# Patient Record
Sex: Female | Born: 2000 | Race: Black or African American | Hispanic: No | Marital: Single | Smoking: Never smoker
Health system: Southern US, Community
[De-identification: ages and names within clinical notes are randomized; demographics above are authoritative.]

## PROBLEM LIST (undated history)

## (undated) VITALS — BP 106/59 | HR 79 | Temp 98.6°F | Resp 16 | Ht <= 58 in | Wt 118.0 lb

## (undated) DIAGNOSIS — F329 Major depressive disorder, single episode, unspecified: Secondary | ICD-10-CM

## (undated) DIAGNOSIS — F32A Depression, unspecified: Secondary | ICD-10-CM

## (undated) DIAGNOSIS — F419 Anxiety disorder, unspecified: Secondary | ICD-10-CM

## (undated) DIAGNOSIS — F319 Bipolar disorder, unspecified: Secondary | ICD-10-CM

## (undated) HISTORY — PX: NO PAST SURGERIES: SHX2092

---
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
## 2019-02-07 ENCOUNTER — Telehealth: Payer: Medicaid Other | Admitting: Family

## 2019-02-07 DIAGNOSIS — R079 Chest pain, unspecified: Secondary | ICD-10-CM

## 2019-02-07 DIAGNOSIS — R0602 Shortness of breath: Secondary | ICD-10-CM

## 2019-02-07 DIAGNOSIS — K219 Gastro-esophageal reflux disease without esophagitis: Secondary | ICD-10-CM

## 2019-02-07 DIAGNOSIS — R062 Wheezing: Secondary | ICD-10-CM

## 2019-02-07 NOTE — Progress Notes (Signed)
Based on what you shared with me, I feel your condition warrants further evaluation and I recommend that you be seen for a face to face office visit.    Given your symptoms of acid reflux, chest pain, shortness of breath, and wheezing you need to be seen face-to-face to be evaluated. These are not normal symptoms of acid reflux.  NOTE: If you entered your credit card information for this eVisit, you will not be charged. You may see a "hold" on your card for the $35 but that hold will drop off and you will not have a charge processed.  If you are having a true medical emergency please call 911.  If you need an urgent face to face visit, [place] has four urgent care centers for your convenience.    PLEASE NOTE: THE [facility] LOCATIONS AND URGENT CARE CLINICS DO NOT HAVE THE TESTING FOR CORONAVIRUS COVID19 AVAILABLE.  IF YOU FEEL YOU NEED THIS TEST YOU MUST GO TO A TRIAGE LOCATION AT ONE OF THE HOSPITAL EMERGENCY DEPARTMENTS   [email or web address] to reserve your spot online an avoid wait times  [facility] [address], [address] [place], [place] [address] Modified hours of operation: Monday-Friday, 12 PM to 6 PM  Saturday & Sunday 10 AM to 4 PM *Across the street from [employer]  [employer] (New Address!) [address], [address] [place], [place] [address] *Just off [employer], across the road from [place]* Modified hours of operation: Monday-Friday, 12 PM to 6 PM  Closed Saturday & Sunday  [facility]'s modified hours of operation will be in effect from May 1 until May 31   The following sites will take your insurance:  . [facility]  [phone] Get Driving Directions Find a Provider at this Location  [address] [place], [place] [address] . 10 am to 8 pm Monday-Friday . 12 pm to 8 pm Saturday-Sunday   . [facility] at [facility]  [phone] Get Driving Directions Find  a Provider at this Location  [address] [place], [place] [address] . 8 am to 8 pm Monday-Friday . 9 am to 6 pm Saturday . 11 am to 6 pm Sunday   . [facility] at [facility]  [phone] Get Driving Directions  [address] [place], [place] [address] . 8 am to 8 pm Monday-Friday . 8 am to 4 pm Saturday-Sunday   Your e-visit answers were reviewed by a board certified advanced clinical practitioner to complete your personal care plan.  Thank you for using e-Visits.

## 2019-02-12 ENCOUNTER — Emergency Department (HOSPITAL_COMMUNITY): Payer: Medicaid Other

## 2019-02-12 ENCOUNTER — Encounter (HOSPITAL_COMMUNITY): Payer: Self-pay | Admitting: Emergency Medicine

## 2019-02-12 ENCOUNTER — Emergency Department (HOSPITAL_COMMUNITY)
Admission: EM | Admit: 2019-02-12 | Discharge: 2019-02-12 | Disposition: A | Discharge status: Home / Self Care | Payer: Medicaid Other | Attending: Emergency Medicine | Admitting: Emergency Medicine

## 2019-02-12 ENCOUNTER — Other Ambulatory Visit: Payer: Self-pay

## 2019-02-12 DIAGNOSIS — R1012 Left upper quadrant pain: Secondary | ICD-10-CM | POA: Diagnosis not present

## 2019-02-12 DIAGNOSIS — R1013 Epigastric pain: Secondary | ICD-10-CM | POA: Insufficient documentation

## 2019-02-12 DIAGNOSIS — R109 Unspecified abdominal pain: Secondary | ICD-10-CM | POA: Diagnosis present

## 2019-02-12 LAB — COMPREHENSIVE METABOLIC PANEL
ALT: 13 U/L (ref 0–44)
AST: 18 U/L (ref 15–41)
Albumin: 4 g/dL (ref 3.5–5.0)
Alkaline Phosphatase: 47 U/L (ref 38–126)
Anion gap: 12 (ref 5–15)
BUN: 12 mg/dL (ref 6–20)
CO2: 22 mmol/L (ref 22–32)
Calcium: 9.1 mg/dL (ref 8.9–10.3)
Chloride: 105 mmol/L (ref 98–111)
Creatinine, Ser: 0.74 mg/dL (ref 0.44–1.00)
GFR calc Af Amer: >60 mL/min (ref >60)
GFR calc non Af Amer: >60 mL/min (ref >60)
Glucose, Bld: 105 mg/dL — ABNORMAL HIGH (ref 70–99)
Potassium: 3.6 mmol/L (ref 3.5–5.1)
Sodium: 139 mmol/L (ref 135–145)
Total Bilirubin: 0.3 mg/dL (ref 0.3–1.2)
Total Protein: 6.6 g/dL (ref 6.5–8.1)

## 2019-02-12 LAB — CBC
HCT: 37.1 % (ref 36.0–46.0)
Hemoglobin: 12.5 g/dL (ref 12.0–15.0)
MCH: 28.9 pg (ref 26.0–34.0)
MCHC: 33.7 g/dL (ref 30.0–36.0)
MCV: 85.9 fL (ref 80.0–100.0)
Platelets: 309 10*3/uL (ref 150–400)
RBC: 4.32 MIL/uL (ref 3.87–5.11)
RDW: 12.4 % (ref 11.5–15.5)
WBC: 6.9 10*3/uL (ref 4.0–10.5)
nRBC: 0 % (ref 0.0–0.2)

## 2019-02-12 LAB — URINALYSIS, ROUTINE W REFLEX MICROSCOPIC
Bilirubin Urine: NEGATIVE
Glucose, UA: NEGATIVE mg/dL
Ketones, ur: NEGATIVE mg/dL
Leukocytes,Ua: NEGATIVE
Nitrite: NEGATIVE
Protein, ur: NEGATIVE mg/dL
Specific Gravity, Urine: 1.025 (ref 1.005–1.030)
pH: 6 (ref 5.0–8.0)

## 2019-02-12 LAB — URINALYSIS, MICROSCOPIC (REFLEX)

## 2019-02-12 LAB — I-STAT BETA HCG BLOOD, ED (MC, WL, AP ONLY): I-stat hCG, quantitative: <5 m[IU]/mL (ref <5)

## 2019-02-12 LAB — LIPASE, BLOOD: Lipase: 35 U/L (ref 11–51)

## 2019-02-12 MED ORDER — OMEPRAZOLE 20 MG PO CPDR: 20.0000 mg | DELAYED_RELEASE_CAPSULE | Freq: Every day | ORAL | 1 refills | Status: DC

## 2019-02-12 MED ORDER — ALUM & MAG HYDROXIDE-SIMETH 200-200-20 MG/5ML PO SUSP
30.0000 mL | Freq: Once | ORAL | Status: AC
  Administered 2019-02-12: 30 mL via ORAL
  Filled 2019-02-12: qty 30

## 2019-02-12 MED ORDER — SODIUM CHLORIDE 0.9% FLUSH
3.0000 mL | Freq: Once | INTRAVENOUS | Status: AC
  Administered 2019-02-12: 18:00:00 3 mL via INTRAVENOUS

## 2019-02-12 NOTE — Discharge Instructions (Signed)
Return if any problems.  See your Physician for recheck in 2-3 days °

## 2019-02-12 NOTE — ED Notes (Signed)
Patient verbalizes understanding of discharge instructions. Opportunity for questioning and answers were provided. Armband removed by staff, pt discharged from ED.  

## 2019-02-12 NOTE — ED Triage Notes (Signed)
Patient reports L sided abdominal pain x 1 week. Denies N/V/D, fevers/chills, or urinary symptoms. LMP last week.
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
## 2019-03-27 ENCOUNTER — Telehealth: Payer: Self-pay | Admitting: Licensed Clinical Social Worker

## 2019-03-27 NOTE — Telephone Encounter (Signed)
Spk with pt regarding scheduled virtual visit. Pt expressed understanding

## 2019-03-28 ENCOUNTER — Encounter: Payer: Self-pay | Admitting: Certified Nurse Midwife

## 2019-03-28 ENCOUNTER — Ambulatory Visit (INDEPENDENT_AMBULATORY_CARE_PROVIDER_SITE_OTHER): Payer: Medicaid Other | Admitting: Certified Nurse Midwife

## 2019-03-28 DIAGNOSIS — N946 Dysmenorrhea, unspecified: Secondary | ICD-10-CM | POA: Diagnosis not present

## 2019-03-28 NOTE — Progress Notes (Signed)
LMP:03/27/19 Periods are irregular  Pt is sexually active . Contraception: Pills  CC: Periods have become very painful last cycle had pain *chest and *back pain pt notes vomiting. Pt states  OTC pain medication does not help. Pt states this has been a issue for a long time.

## 2019-03-28 NOTE — Progress Notes (Signed)
    TELEHEALTH GYNECOLOGY VIRTUAL VIDEO VISIT ENCOUNTER NOTE  Provider location: [facility] for [facility] at [place]   I connected with Suzanne Garcia on 03/28/19 at  9:47 AM EDT by WebEx Video Encounter at home and verified that I am speaking with the correct person using two identifiers.   I discussed the limitations, risks, security and privacy concerns of performing an evaluation and management service by telephone and the availability of in person appointments. I also discussed with the patient that there may be a patient responsible charge related to this service. The patient expressed understanding and agreed to proceed.   History:  Suzanne Garcia is a 18 y.o. G0P0000 female being evaluated today for dysmenorrhea and vomiting. She denies any abnormal vaginal discharge, bleeding, pain with intercourse or other concerns.       Past Medical History:  Diagnosis Date  . Depression    History reviewed. No pertinent surgical history. The following portions of the patient's history were reviewed and updated as appropriate: allergies, current medications, past family history, past medical history, past social history, past surgical history and problem list.   Health Maintenance:  She has never had a pap, <20yo   Review of Systems:  Pertinent items noted in HPI and remainder of comprehensive ROS otherwise negative.  Physical Exam:   General:  Alert, oriented and cooperative. Patient appears to be in no acute distress.  Mental Status: Normal mood and affect. Normal behavior. Normal judgment and thought content.   Respiratory: Normal respiratory effort, no problems with respiration noted  Rest of physical exam deferred due to type of encounter  Labs and Imaging No results found for this or any previous visit (from the past 336 hour(s)). No results found.     Assessment and Plan:     1. Dysmenorrhea in adolescent - Patient reports dysmenorrhea has been occurring for the  past 2-3 years  - Pain is bad where is causes her to vomit, rates pain 8/10 when it occurs - Has tried taking Tylenol and Ibuprofen in the past and it does not work  - [facility] started her on [place] on 6/9  - Reports starting cycle yesterday, 6/23, pain is better with this cycle since starting medication  - Educated on giving OCPs 2-3 months to have full effect  - will follow up in office in 2-3 months for dysmenorrhea, if pain is worse will schedule patient for a pelvic US, patient aware of plan and agrees.      I discussed the assessment and treatment plan with the patient. The patient was provided an opportunity to ask questions and all were answered. The patient agreed with the plan and demonstrated an understanding of the instructions.   The patient was advised to call back or seek an in-person evaluation/go to the ED if the symptoms worsen or if the condition fails to improve as anticipated.  I provided 14 minutes of face-to-face time during this encounter.   Lajean Manes, [place] for [employer], [place]

## 2019-05-23 ENCOUNTER — Ambulatory Visit (HOSPITAL_COMMUNITY)
Admission: EM | Admit: 2019-05-23 | Discharge: 2019-05-23 | Disposition: A | Discharge status: Home / Self Care | Payer: Medicaid Other | Attending: Family Medicine | Admitting: Family Medicine

## 2019-05-23 ENCOUNTER — Other Ambulatory Visit: Payer: Self-pay

## 2019-05-23 ENCOUNTER — Encounter (HOSPITAL_COMMUNITY): Payer: Self-pay | Admitting: Emergency Medicine

## 2019-05-23 DIAGNOSIS — N766 Ulceration of vulva: Secondary | ICD-10-CM | POA: Diagnosis present

## 2019-05-23 DIAGNOSIS — Z113 Encounter for screening for infections with a predominantly sexual mode of transmission: Secondary | ICD-10-CM | POA: Insufficient documentation

## 2019-05-23 MED ORDER — VALACYCLOVIR HCL 1 G PO TABS: 1000.0000 mg | ORAL_TABLET | Freq: Two times a day (BID) | ORAL | 0 refills | Status: AC
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
## 2019-05-23 NOTE — Discharge Instructions (Signed)
We will start treatment for herpes pending the results of the test. I think this may be more related to shaving and ingrown hair which erupted.  Avoid friction to the area, such as with intercourse, until it has healed.  Will notify you of any positive findings and if any changes to treatment are needed.  Dennis Bast may monitor your results on your MyChart online as well.   Medication may be stopped if your test is negative.  If any worsening or persistent symptoms please return or follow up with your pcp.

## 2019-05-23 NOTE — ED Triage Notes (Signed)
Pt here for vaginal discharge and possible "bump" to vaginal area

## 2019-05-25 LAB — CERVICOVAGINAL ANCILLARY ONLY
Bacterial vaginitis: NEGATIVE
Candida vaginitis: NEGATIVE
Chlamydia: NEGATIVE
Neisseria Gonorrhea: NEGATIVE
Trichomonas: NEGATIVE

## 2019-05-27 ENCOUNTER — Ambulatory Visit (HOSPITAL_COMMUNITY)
Admission: EM | Admit: 2019-05-27 | Discharge: 2019-05-27 | Disposition: A | Discharge status: Home / Self Care | Payer: Medicaid Other | Attending: Family Medicine | Admitting: Family Medicine

## 2019-05-27 ENCOUNTER — Encounter (HOSPITAL_COMMUNITY): Payer: Self-pay

## 2019-05-27 ENCOUNTER — Other Ambulatory Visit: Payer: Self-pay

## 2019-05-27 DIAGNOSIS — Z202 Contact with and (suspected) exposure to infections with a predominantly sexual mode of transmission: Secondary | ICD-10-CM

## 2019-05-27 LAB — HSV CULTURE AND TYPING

## 2019-05-27 NOTE — Discharge Instructions (Signed)
As discussed, no new lesions for swabbing. You can follow up with a PCP for further evaluation needed.

## 2019-05-27 NOTE — ED Provider Notes (Signed)
Patient requesting retesting of HSV. Was seen 05/23/2019 and swabbed ulceration for HSV with positive results but low viral count. No new lesions. No new symptoms. No other complaints.   Discussed with patient, no available retest available as we do not provide blood antibody testing. No new lesions for swabbing. Provided PCP information for patient to follow if needed.    Ok Edwards, PA-C 05/27/19 1327

## 2019-05-27 NOTE — ED Triage Notes (Signed)
Pt states she wants to be tested for Herpes.

## 2019-07-09 ENCOUNTER — Other Ambulatory Visit: Payer: Self-pay

## 2019-07-09 ENCOUNTER — Encounter (HOSPITAL_COMMUNITY): Payer: Self-pay | Admitting: Emergency Medicine

## 2019-07-09 ENCOUNTER — Emergency Department (HOSPITAL_COMMUNITY): Payer: Medicaid Other

## 2019-07-09 ENCOUNTER — Emergency Department (HOSPITAL_COMMUNITY)
Admission: EM | Admit: 2019-07-09 | Discharge: 2019-07-10 | Disposition: A | Discharge status: Home / Self Care | Payer: Medicaid Other | Attending: Emergency Medicine | Admitting: Emergency Medicine

## 2019-07-09 DIAGNOSIS — Z79899 Other long term (current) drug therapy: Secondary | ICD-10-CM | POA: Diagnosis not present

## 2019-07-09 DIAGNOSIS — Z3202 Encounter for pregnancy test, result negative: Secondary | ICD-10-CM

## 2019-07-09 DIAGNOSIS — R0789 Other chest pain: Secondary | ICD-10-CM

## 2019-07-09 DIAGNOSIS — R0602 Shortness of breath: Secondary | ICD-10-CM | POA: Diagnosis not present

## 2019-07-09 LAB — BASIC METABOLIC PANEL
Anion gap: 8 (ref 5–15)
BUN: 9 mg/dL (ref 6–20)
CO2: 24 mmol/L (ref 22–32)
Calcium: 8.9 mg/dL (ref 8.9–10.3)
Chloride: 107 mmol/L (ref 98–111)
Creatinine, Ser: 0.81 mg/dL (ref 0.44–1.00)
GFR calc Af Amer: >60 mL/min (ref >60)
GFR calc non Af Amer: >60 mL/min (ref >60)
Glucose, Bld: 102 mg/dL — ABNORMAL HIGH (ref 70–99)
Potassium: 3.5 mmol/L (ref 3.5–5.1)
Sodium: 139 mmol/L (ref 135–145)

## 2019-07-09 LAB — CBC
HCT: 37.4 % (ref 36.0–46.0)
Hemoglobin: 12.7 g/dL (ref 12.0–15.0)
MCH: 29.5 pg (ref 26.0–34.0)
MCHC: 34 g/dL (ref 30.0–36.0)
MCV: 87 fL (ref 80.0–100.0)
Platelets: 351 10*3/uL (ref 150–400)
RBC: 4.3 MIL/uL (ref 3.87–5.11)
RDW: 13.1 % (ref 11.5–15.5)
WBC: 7.2 10*3/uL (ref 4.0–10.5)
nRBC: 0 % (ref 0.0–0.2)

## 2019-07-09 LAB — I-STAT BETA HCG BLOOD, ED (MC, WL, AP ONLY): I-stat hCG, quantitative: <5 m[IU]/mL (ref <5)

## 2019-07-09 LAB — TROPONIN I (HIGH SENSITIVITY): Troponin I (High Sensitivity): <2 ng/L (ref <18)

## 2019-07-09 MED ORDER — SODIUM CHLORIDE 0.9% FLUSH: 3.0000 mL | Freq: Once | INTRAVENOUS | Status: DC

## 2019-07-09 NOTE — ED Triage Notes (Signed)
Pt c/o chest pain x 1 week, also shortness of breath when exercising. Denies cough/fever/known COVID exposure. Also requesting pregnancy test, LMP 9/30

## 2019-07-10 LAB — TROPONIN I (HIGH SENSITIVITY): Troponin I (High Sensitivity): 3 ng/L (ref <18)

## 2019-07-10 MED ORDER — NAPROXEN 500 MG PO TABS: 500.0000 mg | ORAL_TABLET | Freq: Two times a day (BID) | ORAL | 0 refills | Status: DC

## 2019-07-10 MED ORDER — NAPROXEN 250 MG PO TABS
500.0000 mg | ORAL_TABLET | Freq: Once | ORAL | Status: AC
  Administered 2019-07-10: 500 mg via ORAL
  Filled 2019-07-10: qty 2

## 2019-07-10 NOTE — Discharge Instructions (Signed)
You were seen today for chest pain.  Your work-up is reassuring including chest x-ray, EKG, other work-up.  Take naproxen as needed twice daily to see if this helps.  Follow-up with your primary physician if symptoms persist or worsen.
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
## 2019-07-10 NOTE — ED Notes (Addendum)
Pt states that part of the reason that she is here is to obtain a pregnancy test but states "nobody asked for my pee". Pt advised that we don't generally do urine pregnancy tests for chest pain.

## 2019-09-17 ENCOUNTER — Other Ambulatory Visit: Payer: Self-pay

## 2019-09-17 ENCOUNTER — Emergency Department (HOSPITAL_COMMUNITY)
Admission: EM | Admit: 2019-09-17 | Discharge: 2019-09-17 | Disposition: A | Discharge status: Home / Self Care | Payer: Medicaid Other | Attending: Emergency Medicine | Admitting: Emergency Medicine

## 2019-09-17 ENCOUNTER — Encounter (HOSPITAL_COMMUNITY): Payer: Self-pay | Admitting: Emergency Medicine

## 2019-09-17 DIAGNOSIS — Z20822 Contact with and (suspected) exposure to covid-19: Secondary | ICD-10-CM

## 2019-09-17 DIAGNOSIS — Z79899 Other long term (current) drug therapy: Secondary | ICD-10-CM | POA: Insufficient documentation

## 2019-09-17 DIAGNOSIS — U071 COVID-19: Secondary | ICD-10-CM | POA: Diagnosis not present

## 2019-09-17 DIAGNOSIS — R0981 Nasal congestion: Secondary | ICD-10-CM | POA: Diagnosis present

## 2019-09-17 NOTE — ED Triage Notes (Signed)
Pt states she would like to be tested for covid due to her mom testing positive she states she has felt a burning in her nose as well as a dull headache for 4 days. In no distress.
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
## 2019-09-18 LAB — NOVEL CORONAVIRUS, NAA (HOSP ORDER, SEND-OUT TO REF LAB; TAT 18-24 HRS): SARS-CoV-2, NAA: DETECTED — AB

## 2019-10-31 ENCOUNTER — Encounter (HOSPITAL_COMMUNITY): Payer: Self-pay | Admitting: Emergency Medicine

## 2019-10-31 ENCOUNTER — Emergency Department (HOSPITAL_COMMUNITY)
Admission: EM | Admit: 2019-10-31 | Discharge: 2019-11-01 | Disposition: A | Discharge status: Home / Self Care | Payer: Medicaid Other | Attending: Emergency Medicine | Admitting: Emergency Medicine

## 2019-10-31 ENCOUNTER — Other Ambulatory Visit: Payer: Self-pay

## 2019-10-31 DIAGNOSIS — Z202 Contact with and (suspected) exposure to infections with a predominantly sexual mode of transmission: Secondary | ICD-10-CM | POA: Insufficient documentation

## 2019-10-31 DIAGNOSIS — R102 Pelvic and perineal pain: Secondary | ICD-10-CM | POA: Insufficient documentation

## 2019-10-31 DIAGNOSIS — Z79899 Other long term (current) drug therapy: Secondary | ICD-10-CM | POA: Diagnosis not present

## 2019-10-31 DIAGNOSIS — R300 Dysuria: Secondary | ICD-10-CM | POA: Diagnosis present

## 2019-10-31 DIAGNOSIS — N949 Unspecified condition associated with female genital organs and menstrual cycle: Secondary | ICD-10-CM

## 2019-10-31 NOTE — ED Notes (Signed)
Pt came to the ED per triage complaint. Pt conscious, breathing, and A&Ox4. Pt ambulated to bay 6 with a smooth and steady gait. States she has a painful bump on her labia. Pt denies dysuria, hematuria, and increased frequency. Pt denies discharge. Pt endorses one sexual partner with unprotected sexual intercourse. Will continue to monitor.

## 2019-10-31 NOTE — ED Notes (Signed)
PA at bedside performing pelvic exam

## 2019-10-31 NOTE — ED Triage Notes (Signed)
Pt states she has like a bump on her labia and is painful to touch.  Swollen and red.

## 2019-11-01 LAB — URINALYSIS, ROUTINE W REFLEX MICROSCOPIC
Bacteria, UA: NONE SEEN
Bilirubin Urine: NEGATIVE
Glucose, UA: NEGATIVE mg/dL
Hgb urine dipstick: NEGATIVE
Ketones, ur: NEGATIVE mg/dL
Nitrite: NEGATIVE
Protein, ur: NEGATIVE mg/dL
Specific Gravity, Urine: 1.024 (ref 1.005–1.030)
pH: 5 (ref 5.0–8.0)

## 2019-11-01 LAB — WET PREP, GENITAL
Clue Cells Wet Prep HPF POC: NONE SEEN
Sperm: NONE SEEN
Trich, Wet Prep: NONE SEEN
Yeast Wet Prep HPF POC: NONE SEEN

## 2019-11-01 MED ORDER — CEPHALEXIN 500 MG PO CAPS: 500.0000 mg | ORAL_CAPSULE | Freq: Four times a day (QID) | ORAL | 0 refills | Status: DC

## 2019-11-01 NOTE — Discharge Instructions (Addendum)
1. Medications: Keflex, usual home medications 2. Treatment: rest, drink plenty of fluids, take medications as prescribed 3. Follow Up: Please followup with your primary doctor or OB/GYN in 2-3 days for discussion of your diagnoses and further evaluation after today's visit; if you do not have a primary care doctor use the resource guide provided to find one; return to the ER for fevers, persistent vomiting, worsening abdominal pain or other concerning symptoms.
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
## 2019-11-01 NOTE — ED Notes (Signed)
Pt resting in bed. No distress noted. Will continue to monitor.  

## 2019-11-03 ENCOUNTER — Telehealth: Payer: Medicaid Other

## 2019-11-06 ENCOUNTER — Ambulatory Visit (INDEPENDENT_AMBULATORY_CARE_PROVIDER_SITE_OTHER)
Admission: RE | Admit: 2019-11-06 | Discharge: 2019-11-06 | Disposition: A | Discharge status: Home / Self Care | Payer: Medicaid Other | Source: Ambulatory Visit

## 2019-11-06 ENCOUNTER — Other Ambulatory Visit: Payer: Self-pay

## 2019-11-06 DIAGNOSIS — N898 Other specified noninflammatory disorders of vagina: Secondary | ICD-10-CM

## 2019-11-06 DIAGNOSIS — A749 Chlamydial infection, unspecified: Secondary | ICD-10-CM

## 2019-11-06 LAB — GC/CHLAMYDIA PROBE AMP (~~LOC~~) NOT AT ARMC
Chlamydia: POSITIVE — AB
Neisseria Gonorrhea: NEGATIVE

## 2019-11-06 MED ORDER — DOXYCYCLINE HYCLATE 100 MG PO CAPS: 100.0000 mg | ORAL_CAPSULE | Freq: Two times a day (BID) | ORAL | 0 refills | Status: DC

## 2019-11-06 NOTE — Discharge Instructions (Addendum)
Take the doxycycline as directed.  Do not have sexual activity for 7 days.    Come here to be seen in person or follow-up with your primary care provider if your symptoms do not resolve with this medication.

## 2019-11-06 NOTE — ED Provider Notes (Signed)
Virtual Visit via Video Note:  Suzanne Garcia  initiated request for Telemedicine visit with [facility] Urgent Care team. I connected with Suzanne Garcia  on 11/06/2019 at 4:14 PM  for a synchronized telemedicine visit using a video enabled HIPPA compliant telemedicine application. I verified that I am speaking with Suzanne Garcia  using two identifiers. Mickie Bail, NP  was physically located in a [facility] Urgent care site and Suzanne Garcia was located at a different location.   The limitations of evaluation and management by telemedicine as well as the availability of in-person appointments were discussed. Patient was informed that she  may incur a bill ( including co-pay) for this virtual visit encounter. Suzanne Garcia  expressed understanding and gave verbal consent to proceed with virtual visit.     History of Present Illness:Suzanne Garcia  is a 19 y.o. female presents for evaluation of yellow-brown vaginal discharge and vaginal "pressure".  She states she needs "a refill on antibiotics".  She was seen in the ED on 10/31/2019 and treated with Keflex for dysuria; her test results came back positive for chlamydia; a RN attempted to contact patient for treatment but was unable to reach her.  She denies pregnancy or breastfeeding.     No Known Allergies   Past Medical History:  Diagnosis Date  . Depression      Social History   Tobacco Use  . Smoking status: Never Smoker  . Smokeless tobacco: Never Used  Substance Use Topics  . Alcohol use: No  . Drug use: No        Observations/Objective: Physical Exam  VITALS: Patient denies fever. GENERAL: Alert, appears well and in no acute distress. HEENT: Atraumatic. NECK: Normal movements of the head and neck. CARDIOPULMONARY: No increased WOB. Speaking in clear sentences. I:E ratio WNL.  MS: Moves all visible extremities without noticeable abnormality. PSYCH: Pleasant and cooperative, well-groomed. Speech normal  rate and rhythm. Affect is appropriate. Insight and judgement are appropriate. Attention is focused, linear, and appropriate.  NEURO: CN grossly intact. Oriented as arrived to appointment on time with no prompting. Moves both UE equally.  SKIN: No obvious lesions, wounds, erythema, or cyanosis noted on face or hands.   Assessment and Plan:    ICD-10-CM   1. Chlamydia  A74.9   2. Vaginal discharge  N89.8        Follow Up Instructions: Patient tested positive for chlamydia on 10/31/2019 but did not receive treatment.  Treating today with doxycycline.  Instructed patient to abstain from sexual activity x7 days.  Instructed her to come here to be seen in person or follow-up with her PCP if her symptoms do not resolve with the medication.  Patient agrees to plan of care.      I discussed the assessment and treatment plan with the patient. The patient was provided an opportunity to ask questions and all were answered. The patient agreed with the plan and demonstrated an understanding of the instructions.   The patient was advised to call back or seek an in-person evaluation if the symptoms worsen or if the condition fails to improve as anticipated.      Mickie Bail, NP  11/06/2019 4:14 PM         Mickie Bail, NP 11/06/19 [phone]

## 2020-01-16 ENCOUNTER — Emergency Department (HOSPITAL_COMMUNITY)
Admission: EM | Admit: 2020-01-16 | Discharge: 2020-01-16 | Disposition: A | Discharge status: Home / Self Care | Payer: Medicaid Other | Attending: Emergency Medicine | Admitting: Emergency Medicine

## 2020-01-16 ENCOUNTER — Encounter (HOSPITAL_COMMUNITY): Payer: Self-pay

## 2020-01-16 ENCOUNTER — Emergency Department (HOSPITAL_BASED_OUTPATIENT_CLINIC_OR_DEPARTMENT_OTHER): Payer: Medicaid Other

## 2020-01-16 ENCOUNTER — Other Ambulatory Visit: Payer: Self-pay

## 2020-01-16 DIAGNOSIS — M79652 Pain in left thigh: Secondary | ICD-10-CM | POA: Insufficient documentation

## 2020-01-16 DIAGNOSIS — Z793 Long term (current) use of hormonal contraceptives: Secondary | ICD-10-CM | POA: Insufficient documentation

## 2020-01-16 DIAGNOSIS — T148XXA Other injury of unspecified body region, initial encounter: Secondary | ICD-10-CM

## 2020-01-16 DIAGNOSIS — M79605 Pain in left leg: Secondary | ICD-10-CM

## 2020-01-16 DIAGNOSIS — R2242 Localized swelling, mass and lump, left lower limb: Secondary | ICD-10-CM | POA: Insufficient documentation

## 2020-01-16 DIAGNOSIS — R609 Edema, unspecified: Secondary | ICD-10-CM

## 2020-01-16 DIAGNOSIS — Z79899 Other long term (current) drug therapy: Secondary | ICD-10-CM | POA: Diagnosis not present

## 2020-01-16 DIAGNOSIS — M7989 Other specified soft tissue disorders: Secondary | ICD-10-CM | POA: Diagnosis not present

## 2020-01-16 LAB — POC URINE PREG, ED: Preg Test, Ur: NEGATIVE

## 2020-01-16 MED ORDER — METHOCARBAMOL 500 MG PO TABS: 500.0000 mg | ORAL_TABLET | Freq: Two times a day (BID) | ORAL | 0 refills | Status: AC

## 2020-01-16 MED ORDER — ACETAMINOPHEN 500 MG PO TABS
500.0000 mg | ORAL_TABLET | Freq: Once | ORAL | Status: AC
  Administered 2020-01-16: 09:00:00 500 mg via ORAL
  Filled 2020-01-16: qty 1

## 2020-01-16 NOTE — ED Triage Notes (Signed)
Pt reports left leg pain for the past month, reports redness and swelling as well. Pt ambulatory.

## 2020-01-16 NOTE — Discharge Instructions (Addendum)
You have been diagnosed today with Left Leg Pain, Muscle Strain.  At this time there does not appear to be the presence of an emergent medical condition, however there is always the potential for conditions to change. Please read and follow the below instructions.  Please return to the Emergency Department immediately for any new or worsening symptoms. Please be sure to follow up with your Primary Care Provider within one week regarding your visit today; please call their office to schedule an appointment even if you are feeling better for a follow-up visit. You may use the muscle relaxer Robaxin as prescribed to help with your symptoms.  Do not drive or operate heavy machinery while taking Robaxin as it will make you drowsy.  Do not drink alcohol or take other sedating medications while taking Robaxin as this will worsen side effects. Please drink plenty water and get plenty of rest.  Get help right away if: You have any of these problems in your injured area: You have numbness. You have tingling. You lose a lot of strength. You lose control of your bowel or bladders (you pee or poop on your self) You have chest pain or trouble breathing You have any new/concerning or worsening of symptoms.  Please read the additional information packets attached to your discharge summary.  Do not take your medicine if  develop an itchy rash, swelling in your mouth or lips, or difficulty breathing; call 911 and seek immediate emergency medical attention if this occurs.  Note: Portions of this text may have been transcribed using voice recognition software. Every effort was made to ensure accuracy; however, inadvertent computerized transcription errors may still be present.
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
## 2020-01-16 NOTE — Progress Notes (Signed)
Lower extremity venous has been completed.   Preliminary results in CV Proc.   Blanch Media 01/16/2020 10:08 AM

## 2020-01-30 ENCOUNTER — Emergency Department (HOSPITAL_COMMUNITY)
Admission: EM | Admit: 2020-01-30 | Discharge: 2020-01-30 | Disposition: A | Discharge status: Home / Self Care | Payer: Medicaid Other | Attending: Emergency Medicine | Admitting: Emergency Medicine

## 2020-01-30 ENCOUNTER — Encounter (HOSPITAL_COMMUNITY): Payer: Self-pay | Admitting: *Deleted

## 2020-01-30 DIAGNOSIS — R197 Diarrhea, unspecified: Secondary | ICD-10-CM | POA: Insufficient documentation

## 2020-01-30 DIAGNOSIS — R1110 Vomiting, unspecified: Secondary | ICD-10-CM | POA: Diagnosis not present

## 2020-01-30 DIAGNOSIS — N939 Abnormal uterine and vaginal bleeding, unspecified: Secondary | ICD-10-CM | POA: Insufficient documentation

## 2020-01-30 DIAGNOSIS — Z5321 Procedure and treatment not carried out due to patient leaving prior to being seen by health care provider: Secondary | ICD-10-CM | POA: Insufficient documentation

## 2020-01-30 LAB — CBC
HCT: 38.3 % (ref 36.0–46.0)
Hemoglobin: 12.7 g/dL (ref 12.0–15.0)
MCH: 28.8 pg (ref 26.0–34.0)
MCHC: 33.2 g/dL (ref 30.0–36.0)
MCV: 86.8 fL (ref 80.0–100.0)
Platelets: 281 10*3/uL (ref 150–400)
RBC: 4.41 MIL/uL (ref 3.87–5.11)
RDW: 11.9 % (ref 11.5–15.5)
WBC: 10.9 10*3/uL — ABNORMAL HIGH (ref 4.0–10.5)
nRBC: 0 % (ref 0.0–0.2)

## 2020-01-30 LAB — URINALYSIS, ROUTINE W REFLEX MICROSCOPIC
Bacteria, UA: NONE SEEN
Bilirubin Urine: NEGATIVE
Glucose, UA: NEGATIVE mg/dL
Ketones, ur: 80 mg/dL — AB
Nitrite: NEGATIVE
Protein, ur: >=300 mg/dL — AB
RBC / HPF: >50 RBC/hpf — ABNORMAL HIGH (ref 0–5)
Specific Gravity, Urine: 1.028 (ref 1.005–1.030)
WBC, UA: >50 WBC/hpf — ABNORMAL HIGH (ref 0–5)
pH: 7 (ref 5.0–8.0)

## 2020-01-30 LAB — I-STAT BETA HCG BLOOD, ED (MC, WL, AP ONLY): I-stat hCG, quantitative: <5 m[IU]/mL (ref <5)

## 2020-01-30 LAB — COMPREHENSIVE METABOLIC PANEL
ALT: 12 U/L (ref 0–44)
AST: 18 U/L (ref 15–41)
Albumin: 4 g/dL (ref 3.5–5.0)
Alkaline Phosphatase: 38 U/L (ref 38–126)
Anion gap: 10 (ref 5–15)
BUN: 10 mg/dL (ref 6–20)
CO2: 23 mmol/L (ref 22–32)
Calcium: 8.9 mg/dL (ref 8.9–10.3)
Chloride: 105 mmol/L (ref 98–111)
Creatinine, Ser: 0.54 mg/dL (ref 0.44–1.00)
GFR calc Af Amer: >60 mL/min (ref >60)
GFR calc non Af Amer: >60 mL/min (ref >60)
Glucose, Bld: 97 mg/dL (ref 70–99)
Potassium: 4.1 mmol/L (ref 3.5–5.1)
Sodium: 138 mmol/L (ref 135–145)
Total Bilirubin: 0.8 mg/dL (ref 0.3–1.2)
Total Protein: 6.7 g/dL (ref 6.5–8.1)

## 2020-01-30 LAB — LIPASE, BLOOD: Lipase: 23 U/L (ref 11–51)

## 2020-01-30 MED ORDER — SODIUM CHLORIDE 0.9% FLUSH: 3.0000 mL | Freq: Once | INTRAVENOUS | Status: DC

## 2020-01-30 NOTE — ED Notes (Signed)
Pt not responding to vital recheck  

## 2020-01-30 NOTE — ED Triage Notes (Signed)
To ED for eval of passing blood clots (on period) for past 2 days. Pt states she has been vomiting all day today. Pt states she is sexually active. Denies birth control. No difficulty with urination. Complains of diarrhea starting today.

## 2020-03-11 ENCOUNTER — Encounter (HOSPITAL_COMMUNITY): Payer: Self-pay | Admitting: Emergency Medicine

## 2020-03-11 ENCOUNTER — Other Ambulatory Visit: Payer: Self-pay

## 2020-03-11 ENCOUNTER — Emergency Department (HOSPITAL_COMMUNITY)
Admission: EM | Admit: 2020-03-11 | Discharge: 2020-03-11 | Disposition: A | Discharge status: Home / Self Care | Payer: Medicaid Other | Attending: Emergency Medicine | Admitting: Emergency Medicine

## 2020-03-11 DIAGNOSIS — R110 Nausea: Secondary | ICD-10-CM | POA: Insufficient documentation

## 2020-03-11 DIAGNOSIS — R1013 Epigastric pain: Secondary | ICD-10-CM | POA: Insufficient documentation

## 2020-03-11 DIAGNOSIS — N926 Irregular menstruation, unspecified: Secondary | ICD-10-CM | POA: Diagnosis not present

## 2020-03-11 LAB — CBC
HCT: 38.2 % (ref 36.0–46.0)
Hemoglobin: 12.6 g/dL (ref 12.0–15.0)
MCH: 29 pg (ref 26.0–34.0)
MCHC: 33 g/dL (ref 30.0–36.0)
MCV: 88 fL (ref 80.0–100.0)
Platelets: 300 10*3/uL (ref 150–400)
RBC: 4.34 MIL/uL (ref 3.87–5.11)
RDW: 12 % (ref 11.5–15.5)
WBC: 5.8 10*3/uL (ref 4.0–10.5)
nRBC: 0 % (ref 0.0–0.2)

## 2020-03-11 LAB — LIPASE, BLOOD: Lipase: 28 U/L (ref 11–51)

## 2020-03-11 LAB — COMPREHENSIVE METABOLIC PANEL
ALT: 11 U/L (ref 0–44)
AST: 16 U/L (ref 15–41)
Albumin: 3.7 g/dL (ref 3.5–5.0)
Alkaline Phosphatase: 35 U/L — ABNORMAL LOW (ref 38–126)
Anion gap: 8 (ref 5–15)
BUN: 12 mg/dL (ref 6–20)
CO2: 24 mmol/L (ref 22–32)
Calcium: 8.8 mg/dL — ABNORMAL LOW (ref 8.9–10.3)
Chloride: 107 mmol/L (ref 98–111)
Creatinine, Ser: 0.58 mg/dL (ref 0.44–1.00)
GFR calc Af Amer: >60 mL/min (ref >60)
GFR calc non Af Amer: >60 mL/min (ref >60)
Glucose, Bld: 118 mg/dL — ABNORMAL HIGH (ref 70–99)
Potassium: 3.5 mmol/L (ref 3.5–5.1)
Sodium: 139 mmol/L (ref 135–145)
Total Bilirubin: 0.3 mg/dL (ref 0.3–1.2)
Total Protein: 6.3 g/dL — ABNORMAL LOW (ref 6.5–8.1)

## 2020-03-11 LAB — URINALYSIS, ROUTINE W REFLEX MICROSCOPIC
Bilirubin Urine: NEGATIVE
Glucose, UA: NEGATIVE mg/dL
Hgb urine dipstick: NEGATIVE
Ketones, ur: NEGATIVE mg/dL
Leukocytes,Ua: NEGATIVE
Nitrite: NEGATIVE
Protein, ur: 30 mg/dL — AB
Specific Gravity, Urine: 1.026 (ref 1.005–1.030)
pH: 5 (ref 5.0–8.0)

## 2020-03-11 LAB — I-STAT BETA HCG BLOOD, ED (MC, WL, AP ONLY): I-stat hCG, quantitative: <5 m[IU]/mL (ref <5)

## 2020-03-11 MED ORDER — SUCRALFATE 1 G PO TABS
1.0000 g | ORAL_TABLET | Freq: Three times a day (TID) | ORAL | 0 refills | Status: DC
Start: 2020-03-11 — End: 2020-04-02

## 2020-03-11 MED ORDER — FAMOTIDINE 20 MG PO TABS
20.0000 mg | ORAL_TABLET | Freq: Two times a day (BID) | ORAL | 0 refills | Status: DC
Start: 2020-03-11 — End: 2020-04-02

## 2020-03-11 MED ORDER — ONDANSETRON 4 MG PO TBDP
4.0000 mg | ORAL_TABLET | Freq: Three times a day (TID) | ORAL | 0 refills | Status: DC | PRN
Start: 2020-03-11 — End: 2020-04-02

## 2020-03-11 MED ORDER — SODIUM CHLORIDE 0.9% FLUSH: 3.0000 mL | Freq: Once | INTRAVENOUS | Status: DC
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
## 2020-03-11 NOTE — Discharge Instructions (Addendum)
1. Medications:   Start taking carafate with meals and at night, pepcid twice daily with meals. Take Zofran as needed for nausea.  Wait around 20 minutes before eating or drinking after taking this medication. 2. Treatment: rest, drink plenty of fluids, advance diet slowly.  Avoid spicy foods, fried foods, fatty foods, alcohol, or acidic foods.  Refer to the attached information regarding dietary modifications.  Avoid eating too late at night.  Please stop taking any NSAIDs such as ibuprofen, Advil, Aleve, or Motrin.  You can take 1 to 2 tablets of Tylenol every 6 hours as needed for pain.  Do not exceed 4000 mg of Tylenol daily. 3. Follow Up: Please followup with your primary doctor for discussion of your diagnoses and further evaluation after today's visit; follow up with ; Please return to the ER for persistent vomiting, high fevers or worsening symptoms

## 2020-03-11 NOTE — ED Triage Notes (Signed)
Pt reports nausea and vomiting for the past few weeks and reports that she has pain in her abdomen when she eats. LMP 4/28. Reports she is sexually active and denies using birth control, took preg test at home 2 weeks ago that was negative.

## 2020-03-12 ENCOUNTER — Other Ambulatory Visit: Payer: Self-pay

## 2020-03-12 ENCOUNTER — Emergency Department (HOSPITAL_COMMUNITY)
Admission: EM | Admit: 2020-03-12 | Discharge: 2020-03-12 | Disposition: A | Discharge status: Home / Self Care | Payer: Medicaid Other | Attending: Emergency Medicine | Admitting: Emergency Medicine

## 2020-03-12 ENCOUNTER — Encounter (HOSPITAL_COMMUNITY): Payer: Self-pay | Admitting: Emergency Medicine

## 2020-03-12 DIAGNOSIS — R112 Nausea with vomiting, unspecified: Secondary | ICD-10-CM | POA: Diagnosis not present

## 2020-03-12 DIAGNOSIS — Z5321 Procedure and treatment not carried out due to patient leaving prior to being seen by health care provider: Secondary | ICD-10-CM | POA: Diagnosis not present

## 2020-03-12 DIAGNOSIS — R1030 Lower abdominal pain, unspecified: Secondary | ICD-10-CM | POA: Insufficient documentation

## 2020-03-12 DIAGNOSIS — R197 Diarrhea, unspecified: Secondary | ICD-10-CM | POA: Diagnosis not present

## 2020-03-12 LAB — I-STAT BETA HCG BLOOD, ED (MC, WL, AP ONLY): I-stat hCG, quantitative: <5 m[IU]/mL (ref <5)

## 2020-03-12 LAB — CBC
HCT: 36.9 % (ref 36.0–46.0)
Hemoglobin: 12.4 g/dL (ref 12.0–15.0)
MCH: 29.4 pg (ref 26.0–34.0)
MCHC: 33.6 g/dL (ref 30.0–36.0)
MCV: 87.4 fL (ref 80.0–100.0)
Platelets: 305 10*3/uL (ref 150–400)
RBC: 4.22 MIL/uL (ref 3.87–5.11)
RDW: 12 % (ref 11.5–15.5)
WBC: 11 10*3/uL — ABNORMAL HIGH (ref 4.0–10.5)
nRBC: 0 % (ref 0.0–0.2)

## 2020-03-12 LAB — COMPREHENSIVE METABOLIC PANEL
ALT: 11 U/L (ref 0–44)
AST: 20 U/L (ref 15–41)
Albumin: 3.8 g/dL (ref 3.5–5.0)
Alkaline Phosphatase: 35 U/L — ABNORMAL LOW (ref 38–126)
Anion gap: 14 (ref 5–15)
BUN: 11 mg/dL (ref 6–20)
CO2: 19 mmol/L — ABNORMAL LOW (ref 22–32)
Calcium: 8.6 mg/dL — ABNORMAL LOW (ref 8.9–10.3)
Chloride: 101 mmol/L (ref 98–111)
Creatinine, Ser: 0.59 mg/dL (ref 0.44–1.00)
GFR calc Af Amer: >60 mL/min (ref >60)
GFR calc non Af Amer: >60 mL/min (ref >60)
Glucose, Bld: 88 mg/dL (ref 70–99)
Potassium: 3.7 mmol/L (ref 3.5–5.1)
Sodium: 134 mmol/L — ABNORMAL LOW (ref 135–145)
Total Bilirubin: 0.6 mg/dL (ref 0.3–1.2)
Total Protein: 6.5 g/dL (ref 6.5–8.1)

## 2020-03-12 LAB — LIPASE, BLOOD: Lipase: 22 U/L (ref 11–51)

## 2020-03-12 LAB — URINALYSIS, ROUTINE W REFLEX MICROSCOPIC
Bilirubin Urine: NEGATIVE
Glucose, UA: 100 mg/dL — AB
Ketones, ur: >80 mg/dL — AB
Leukocytes,Ua: NEGATIVE
Nitrite: POSITIVE — AB
Protein, ur: 100 mg/dL — AB
Specific Gravity, Urine: 1.025 (ref 1.005–1.030)
pH: 7 (ref 5.0–8.0)

## 2020-03-12 LAB — URINALYSIS, MICROSCOPIC (REFLEX)

## 2020-03-12 MED ORDER — SODIUM CHLORIDE 0.9% FLUSH: 3.0000 mL | Freq: Once | INTRAVENOUS | Status: DC

## 2020-03-12 NOTE — ED Notes (Signed)
Pt called for recheck VS. No answer.

## 2020-03-12 NOTE — ED Triage Notes (Signed)
Onset few weeks ago developed pain lower abdomen with nausea, vomiting, and diarrhea. Seen one day ago told to come to the ED if not feeling better.  Menstrual cycle started yesterday.

## 2020-03-16 ENCOUNTER — Telehealth: Payer: Medicaid Other

## 2020-03-25 ENCOUNTER — Telehealth: Payer: Medicaid Other

## 2020-04-01 ENCOUNTER — Telehealth: Payer: Medicaid Other

## 2020-04-02 ENCOUNTER — Other Ambulatory Visit: Payer: Self-pay

## 2020-04-02 ENCOUNTER — Encounter (HOSPITAL_COMMUNITY): Payer: Self-pay

## 2020-04-02 ENCOUNTER — Ambulatory Visit (HOSPITAL_COMMUNITY)
Admission: EM | Admit: 2020-04-02 | Discharge: 2020-04-02 | Disposition: A | Discharge status: Home / Self Care | Payer: Medicaid Other | Attending: Emergency Medicine | Admitting: Emergency Medicine

## 2020-04-02 DIAGNOSIS — Z3202 Encounter for pregnancy test, result negative: Secondary | ICD-10-CM

## 2020-04-02 DIAGNOSIS — R300 Dysuria: Secondary | ICD-10-CM

## 2020-04-02 DIAGNOSIS — R350 Frequency of micturition: Secondary | ICD-10-CM

## 2020-04-02 LAB — POCT URINALYSIS DIP (DEVICE)
Glucose, UA: NEGATIVE mg/dL
Hgb urine dipstick: NEGATIVE
Ketones, ur: NEGATIVE mg/dL
Leukocytes,Ua: NEGATIVE
Nitrite: NEGATIVE
Protein, ur: NEGATIVE mg/dL
Specific Gravity, Urine: >=1.03 (ref 1.005–1.030)
Urobilinogen, UA: 0.2 mg/dL (ref 0.0–1.0)
pH: 6 (ref 5.0–8.0)

## 2020-04-02 LAB — POC URINE PREG, ED: Preg Test, Ur: NEGATIVE

## 2020-04-02 MED ORDER — PHENAZOPYRIDINE HCL 200 MG PO TABS
200.0000 mg | ORAL_TABLET | Freq: Three times a day (TID) | ORAL | 0 refills | Status: DC
Start: 2020-04-02 — End: 2020-05-04

## 2020-04-02 NOTE — Discharge Instructions (Signed)
Urine did not show signs of infection We are screening for vaginal causes of your urinary symptoms In the meantime please use Pyridium as needed for burning Drink plenty of fluids We will be in touch with any abnormal results and provide further medicines as needed  Return if symptoms not improving or worsening

## 2020-04-02 NOTE — ED Triage Notes (Signed)
Pt c/o urinary frequency, foul smelling urine and dysuria x 2 weeks
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
## 2020-04-03 LAB — URINE CULTURE: Culture: NO GROWTH

## 2020-04-03 LAB — CERVICOVAGINAL ANCILLARY ONLY
Bacterial Vaginitis (gardnerella): NEGATIVE
Candida Glabrata: NEGATIVE
Candida Vaginitis: NEGATIVE
Chlamydia: NEGATIVE
Comment: NEGATIVE
Comment: NEGATIVE
Comment: NEGATIVE
Comment: NEGATIVE
Comment: NEGATIVE
Comment: NORMAL
Neisseria Gonorrhea: NEGATIVE
Trichomonas: NEGATIVE

## 2020-04-23 ENCOUNTER — Other Ambulatory Visit: Payer: Self-pay

## 2020-04-23 ENCOUNTER — Encounter: Payer: Self-pay | Admitting: Women's Health

## 2020-04-23 ENCOUNTER — Ambulatory Visit (INDEPENDENT_AMBULATORY_CARE_PROVIDER_SITE_OTHER): Payer: Medicaid Other | Admitting: Women's Health

## 2020-04-23 ENCOUNTER — Other Ambulatory Visit (HOSPITAL_COMMUNITY)
Admission: RE | Admit: 2020-04-23 | Discharge: 2020-04-23 | Disposition: A | Discharge status: Home / Self Care | Payer: Medicaid Other | Source: Ambulatory Visit | Attending: Women's Health | Admitting: Women's Health

## 2020-04-23 VITALS — BP 101/64 | HR 57 | Ht <= 58 in | Wt 117.0 lb

## 2020-04-23 DIAGNOSIS — N926 Irregular menstruation, unspecified: Secondary | ICD-10-CM

## 2020-04-23 NOTE — Progress Notes (Signed)
History:  [name] is a 19 y.o. G0P0000 who presents to clinic today for irregular bleeding and increased nausea.  LMP 04/16/2020, and pt reports her period is still occurring today, which is close to the end. Period prior to this was 02/2020, 01/2020, 12/2019. Patient reports her periods usually come at the very end/very beginning of the month. Patient reports she does get monthly periods, but was concerned because they were not happening on exactly the same day each month. Patient reports she missed her period in June and it came two weeks late in mid-July. Patient reports this happens occasionally where she will miss a period by a couple of weeks.  Periods last 7 days, patient endorses using 5 pads/tampons on heaviest day. Patient reports heavy cramping that prevents her from performing normal activities of daily living. Patient reports nausea also accompanies her period, which occurs for the first few days of her period when her pain is the worst. Patient reports occasional vomiting because of nausea/pain. Patient reports no bleeding between periods. No history of surgery.  Patient reports she tried using the pill birth control previously, which made her cramps worse. Patient reports she tried the pill for about 2 months. Patient reports trying Junel Fe.  Patient reports she is not taking any medications at this time, NKDA, hx depression, BMI 24.45, patient endorses smoking marijuana, but not cigarettes.  The following portions of the patient's history were reviewed and updated as appropriate: allergies, current medications, family history, past medical history, social history, past surgical history and problem list.  Review of Systems:  Review of Systems  Constitutional: Negative for chills and fever.  Respiratory: Negative for shortness of breath.   Cardiovascular: Negative for chest pain.  Gastrointestinal: Positive for nausea and vomiting. Negative for constipation and  diarrhea.  Genitourinary: Negative for dysuria, frequency and urgency.       Abnormal vaginal bleeding. Heavy cramping during menses.  Neurological: Negative for dizziness and headaches.     Objective:  Physical Exam BP 101/64   Pulse (!) 57   Ht 4\' 10"  (1.473 m)   Wt 117 lb (53.1 kg)   LMP 04/06/2020 (Exact Date)   BMI 24.45 kg/m    Physical Exam Exam conducted with a chaperone present.  Constitutional:      General: She is not in acute distress.    Appearance: She is well-developed. She is not diaphoretic.  HENT:     Head: Normocephalic and atraumatic.  Pulmonary:     Effort: Pulmonary effort is normal.  Abdominal:     Palpations: Abdomen is soft.  Genitourinary:    General: Normal vulva.     Labia:        Right: No rash, tenderness or lesion.        Left: No rash, tenderness or lesion.      Vagina: No vaginal discharge, tenderness or bleeding.     Cervix: No discharge, friability or lesion.     Uterus: Not deviated, not enlarged, not fixed, not tender and no uterine prolapse.      Adnexa:        Right: No mass, tenderness or fullness.         Left: No mass, tenderness or fullness.    Skin:    General: Skin is warm and dry.  Neurological:     Mental Status: She is alert and oriented to person, place, and time.  Psychiatric:        Behavior: Behavior normal.  Thought Content: Thought content normal.        Judgment: Judgment normal.    Labs and Imaging No results found for this or any previous visit (from the past 24 hour(s)).  No results found.   Assessment & Plan:  1. Irregular menses - pt prefers to wait for results of testing for irregular bleeding prior to discussing birth control or other methods for irregular bleeding - POCT urine pregnancy - Cervicovaginal ancillary only( [place]) - US PELVIC COMPLETE WITH TRANSVAGINAL; Future - CBC - Ferritin - TSH  Approximately 12 minutes of face-to-face time was spent with this patient   Kharter Sestak,  Odie Sera, NP 04/23/2020 11:53 AM

## 2020-04-23 NOTE — Progress Notes (Signed)
Pt states with irregular bleeding is also having pain with Nausea.

## 2020-04-23 NOTE — Patient Instructions (Addendum)
Cannabinoid Hyperemesis Syndrome °Cannabinoid hyperemesis syndrome (CHS) is a condition that causes repeated nausea, vomiting, and abdominal pain after long-term (chronic) use of marijuana (cannabis). People with CHS typically use marijuana 3-5 times a day for many years before they have symptoms, although it is possible to develop CHS with as little as 1 use per day. °Symptoms of CHS may be mild at first but can get worse and more frequent. In some cases, CHS may cause vomiting many times a day, which can lead to weight loss and dehydration. CHS may go away and come back many times (recur). People may not have symptoms or may otherwise be healthy in between CHS attacks. °What are the causes? °The exact cause of this condition is not known. Long-term use of marijuana may over-stimulate certain proteins in the brain that react with chemicals in marijuana (cannabinoid receptors). This over-stimulation may cause CHS. °What are the signs or symptoms? °Symptoms of this condition are often mild during the first few attacks, but they can get worse over time. Symptoms may include: °· Frequent nausea, especially early in the morning. °· Vomiting. °· Abdominal pain. °Taking several hot showers throughout the day can also be a sign of this condition. People with CHS may do this because it relieves symptoms. °How is this diagnosed? °This condition may be diagnosed based on: °· Your symptoms and medical history, including any drug use. °· A physical exam. °You may have tests done to rule out other problems. These tests may include: °· Blood tests. °· Urine tests. °· Imaging tests, such as an X-ray or CT scan. °How is this treated? °Treatment for this condition involves stopping marijuana use. Your health care provider may recommend: °· A drug rehabilitation program, if you have trouble stopping marijuana use. °· Medicines for nausea. °· Hot showers to help relieve symptoms. °Certain creams that contain a substance called  capsaicin may improve symptoms when applied to the abdomen. Ask your health care provider before starting any medicines or other treatments. °Severe nausea and vomiting may require you to stay at the hospital. You may need IV fluids to prevent or treat dehydration. You may also need certain medicines that must be given at the hospital. °Follow these instructions at home: °During an attack ° °· Stay in bed and rest in a dark, quiet room. °· Take anti-nausea medicine as told by your health care provider. °· Try taking hot showers to relieve your symptoms. °After an attack °· Drink small amounts of clear fluids slowly. Gradually add more. °· Once you are able to eat without vomiting, eat soft foods in small amounts every 3-4 hours. °General instructions ° °· Do not use any products that contain marijuana.If you need help quitting, ask your health care provider for resources and treatment options. °· Drink enough fluid to keep your urine pale yellow. Avoid drinking fluids that have a lot of sugar or caffeine, such as coffee and soda. °· Take and apply over-the-counter and prescription medicines only as told by your health care provider. Ask your health care provider before starting any new medicines or treatments. °· Keep all follow-up visits as told by your health care provider. This is important. °Contact a health care provider if: °· Your symptoms get worse. °· You cannot drink fluids without vomiting. °· You have pain and trouble swallowing after an attack. °Get help right away if: °· You cannot stop vomiting. °· You have blood in your vomit or your vomit looks like coffee grounds. °· You have   severe abdominal pain.  You have stools that are bloody or black, or stools that look like tar.  You have symptoms of dehydration, such as: ? Sunken eyes. ? Inability to make tears. ? Cracked lips. ? Dry mouth. ? Decreased urine production. ? Weakness. ? Sleepiness. ? Fainting. Summary  Cannabinoid hyperemesis  syndrome (CHS) is a condition that causes repeated nausea, vomiting, and abdominal pain after long-term use of marijuana.  People with CHS typically use marijuana 3-5 times a day for many years before they have symptoms, although it is possible to develop CHS with as little as 1 use per day.  Treatment for this condition involves stopping marijuana use. Hot showers and capsaicin creams may also help relieve symptoms. Ask your health care provider before starting any medicines or other treatments.  Your health care provider may prescribe medicines to help with nausea.  Get help right away if you have signs of dehydration, such as dry mouth, decreased urine production, or weakness. This information is not intended to replace advice given to you by your health care provider. Make sure you discuss any questions you have with your health care provider. Document Revised: 01/27/2018 Document Reviewed: 12/29/2016 Elsevier Patient Education  2020 [employer].        Contraception Choices - [email or web address] Contraception, also called birth control, refers to methods or devices that prevent pregnancy. Hormonal methods Contraceptive implant  A contraceptive implant is a thin, plastic tube that contains a hormone. It is inserted into the upper part of the arm. It can remain in place for up to 3 years. Progestin-only injections Progestin-only injections are injections of progestin, a synthetic form of the hormone progesterone. They are given every 3 months by a health care provider. Birth control pills  Birth control pills are pills that contain hormones that prevent pregnancy. They must be taken once a day, preferably at the same time each day. Birth control patch  The birth control patch contains hormones that prevent pregnancy. It is placed on the skin and must be changed once a week for three weeks and removed on the fourth week. A prescription is needed to use this method of  contraception. Vaginal ring  A vaginal ring contains hormones that prevent pregnancy. It is placed in the vagina for three weeks and removed on the fourth week. After that, the process is repeated with a new ring. A prescription is needed to use this method of contraception. Emergency contraceptive Emergency contraceptives prevent pregnancy after unprotected sex. They come in pill form and can be taken up to 5 days after sex. They work best the sooner they are taken after having sex. Most emergency contraceptives are available without a prescription. This method should not be used as your only form of birth control. Barrier methods Female condom  A female condom is a thin sheath that is worn over the penis during sex. Condoms keep sperm from going inside a woman's body. They can be used with a spermicide to increase their effectiveness. They should be disposed after a single use. Female condom  A female condom is a soft, loose-fitting sheath that is put into the vagina before sex. The condom keeps sperm from going inside a woman's body. They should be disposed after a single use. Diaphragm  A diaphragm is a soft, dome-shaped barrier. It is inserted into the vagina before sex, along with a spermicide. The diaphragm blocks sperm from entering the uterus, and the spermicide kills sperm. A diaphragm should be left in  the vagina for 6-8 hours after sex and removed within 24 hours. A diaphragm is prescribed and fitted by a health care provider. A diaphragm should be replaced every 1-2 years, after giving birth, after gaining more than 15 lb (6.8 kg), and after pelvic surgery. Cervical cap  A cervical cap is a round, soft latex or plastic cup that fits over the cervix. It is inserted into the vagina before sex, along with spermicide. It blocks sperm from entering the uterus. The cap should be left in place for 6-8 hours after sex and removed within 48 hours. A cervical cap must be prescribed and fitted by a  health care provider. It should be replaced every 2 years. Sponge  A sponge is a soft, circular piece of polyurethane foam with spermicide on it. The sponge helps block sperm from entering the uterus, and the spermicide kills sperm. To use it, you make it wet and then insert it into the vagina. It should be inserted before sex, left in for at least 6 hours after sex, and removed and thrown away within 30 hours. Spermicides Spermicides are chemicals that kill or block sperm from entering the cervix and uterus. They can come as a cream, jelly, suppository, foam, or tablet. A spermicide should be inserted into the vagina with an applicator at least 10-15 minutes before sex to allow time for it to work. The process must be repeated every time you have sex. Spermicides do not require a prescription. Intrauterine contraception Intrauterine device (IUD) An IUD is a T-shaped device that is put in a woman's uterus. There are two types:  Hormone IUD.This type contains progestin, a synthetic form of the hormone progesterone. This type can stay in place for 3-5 years.  Copper IUD.This type is wrapped in copper wire. It can stay in place for 10 years.  Permanent methods of contraception Female tubal ligation In this method, a woman's fallopian tubes are sealed, tied, or blocked during surgery to prevent eggs from traveling to the uterus. Hysteroscopic sterilization In this method, a small, flexible insert is placed into each fallopian tube. The inserts cause scar tissue to form in the fallopian tubes and block them, so sperm cannot reach an egg. The procedure takes about 3 months to be effective. Another form of birth control must be used during those 3 months. Female sterilization This is a procedure to tie off the tubes that carry sperm (vasectomy). After the procedure, the man can still ejaculate fluid (semen). Natural planning methods Natural family planning In this method, a couple does not have sex on  days when the woman could become pregnant. Calendar method This means keeping track of the length of each menstrual cycle, identifying the days when pregnancy can happen, and not having sex on those days. Ovulation method In this method, a couple avoids sex during ovulation. Symptothermal method This method involves not having sex during ovulation. The woman typically checks for ovulation by watching changes in her temperature and in the consistency of cervical mucus. Post-ovulation method In this method, a couple waits to have sex until after ovulation. Summary  Contraception, also called birth control, means methods or devices that prevent pregnancy.  Hormonal methods of contraception include implants, injections, pills, patches, vaginal rings, and emergency contraceptives.  Barrier methods of contraception can include female condoms, female condoms, diaphragms, cervical caps, sponges, and spermicides.  There are two types of IUDs (intrauterine devices). An IUD can be put in a woman's uterus to prevent pregnancy for 3-5  years.  Permanent sterilization can be done through a procedure for males, females, or both.  Natural family planning methods involve not having sex on days when the woman could become pregnant. This information is not intended to replace advice given to you by your health care provider. Make sure you discuss any questions you have with your health care provider. Document Revised: 09/22/2017 Document Reviewed: 10/23/2016 Elsevier Patient Education  2020 [employer].

## 2020-04-24 LAB — CBC
Hematocrit: 37.8 % (ref 34.0–46.6)
Hemoglobin: 12.1 g/dL (ref 11.1–15.9)
MCH: 28.5 pg (ref 26.6–33.0)
MCHC: 32 g/dL (ref 31.5–35.7)
MCV: 89 fL (ref 79–97)
Platelets: 296 10*3/uL (ref 150–450)
RBC: 4.25 x10E6/uL (ref 3.77–5.28)
RDW: 12.7 % (ref 11.7–15.4)
WBC: 8.6 10*3/uL (ref 3.4–10.8)

## 2020-04-24 LAB — FERRITIN: Ferritin: 40 ng/mL (ref 15–77)

## 2020-04-24 LAB — TSH: TSH: 2.47 u[IU]/mL (ref 0.450–4.500)

## 2020-04-28 LAB — CERVICOVAGINAL ANCILLARY ONLY
Chlamydia: NEGATIVE
Comment: NEGATIVE
Comment: NEGATIVE
Comment: NORMAL
Neisseria Gonorrhea: NEGATIVE
Trichomonas: NEGATIVE

## 2020-05-01 ENCOUNTER — Ambulatory Visit: Payer: Medicaid Other | Attending: Women's Health

## 2020-05-04 ENCOUNTER — Ambulatory Visit (HOSPITAL_COMMUNITY)
Admission: EM | Admit: 2020-05-04 | Discharge: 2020-05-04 | Disposition: A | Discharge status: Home / Self Care | Payer: Medicaid Other | Attending: Family Medicine | Admitting: Family Medicine

## 2020-05-04 ENCOUNTER — Other Ambulatory Visit: Payer: Self-pay

## 2020-05-04 ENCOUNTER — Encounter (HOSPITAL_COMMUNITY): Payer: Self-pay

## 2020-05-04 DIAGNOSIS — F332 Major depressive disorder, recurrent severe without psychotic features: Secondary | ICD-10-CM | POA: Insufficient documentation

## 2020-05-04 DIAGNOSIS — Z79899 Other long term (current) drug therapy: Secondary | ICD-10-CM | POA: Diagnosis not present

## 2020-05-04 DIAGNOSIS — R05 Cough: Secondary | ICD-10-CM | POA: Insufficient documentation

## 2020-05-04 DIAGNOSIS — Z20822 Contact with and (suspected) exposure to covid-19: Secondary | ICD-10-CM | POA: Diagnosis not present

## 2020-05-04 DIAGNOSIS — R059 Cough, unspecified: Secondary | ICD-10-CM

## 2020-05-04 MED ORDER — OMEPRAZOLE 20 MG PO CPDR
20.0000 mg | DELAYED_RELEASE_CAPSULE | Freq: Every day | ORAL | 0 refills | Status: DC
Start: 2020-05-04 — End: 2020-05-30
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
## 2020-05-04 NOTE — ED Triage Notes (Addendum)
Patient here for cough that has been present for 2-3 weeks. States she feels like there is mucous but "it feels stuck" in her throat. Also concerned about some spotting she had yesterday; denies spotting today. Refused COVID testing.

## 2020-05-04 NOTE — ED Notes (Signed)
I accessed patient chart:   Patient was discharged before I could obtain the orders for covid specimen

## 2020-05-04 NOTE — Discharge Instructions (Signed)
Please try the medicine  We will call with any positive results.  Please follow up if your symptoms fail to improve.

## 2020-05-05 LAB — SARS CORONAVIRUS 2 (TAT 6-24 HRS): SARS Coronavirus 2: NEGATIVE

## 2020-05-07 ENCOUNTER — Ambulatory Visit (HOSPITAL_COMMUNITY)
Admission: EM | Admit: 2020-05-07 | Discharge: 2020-05-07 | Disposition: A | Discharge status: Home / Self Care | Payer: Medicaid Other | Attending: Family Medicine | Admitting: Family Medicine

## 2020-05-07 ENCOUNTER — Encounter (HOSPITAL_COMMUNITY): Payer: Self-pay | Admitting: Emergency Medicine

## 2020-05-07 ENCOUNTER — Other Ambulatory Visit: Payer: Self-pay

## 2020-05-07 DIAGNOSIS — B009 Herpesviral infection, unspecified: Secondary | ICD-10-CM

## 2020-05-07 MED ORDER — VALACYCLOVIR HCL 1 G PO TABS
500.0000 mg | ORAL_TABLET | Freq: Two times a day (BID) | ORAL | 0 refills | Status: AC
Start: 2020-05-07 — End: 2020-05-10
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
## 2020-05-07 NOTE — Discharge Instructions (Signed)
Treating you for herpes flare up Take the medicine as prescribed Follow up as needed for continued or worsening symptoms

## 2020-05-07 NOTE — ED Triage Notes (Signed)
Pt presents with small patch with "three white dots" around vaginal area. States she was seen on 8/1 and was told to return if signs or symptoms appeared.

## 2020-05-30 ENCOUNTER — Other Ambulatory Visit: Payer: Self-pay

## 2020-05-30 ENCOUNTER — Encounter (HOSPITAL_BASED_OUTPATIENT_CLINIC_OR_DEPARTMENT_OTHER): Payer: Self-pay | Admitting: *Deleted

## 2020-05-30 ENCOUNTER — Emergency Department (HOSPITAL_BASED_OUTPATIENT_CLINIC_OR_DEPARTMENT_OTHER)
Admission: EM | Admit: 2020-05-30 | Discharge: 2020-05-30 | Disposition: A | Discharge status: Home / Self Care | Payer: Medicaid Other | Attending: Emergency Medicine | Admitting: Emergency Medicine

## 2020-05-30 DIAGNOSIS — R0982 Postnasal drip: Secondary | ICD-10-CM | POA: Diagnosis not present

## 2020-05-30 DIAGNOSIS — Z79899 Other long term (current) drug therapy: Secondary | ICD-10-CM | POA: Insufficient documentation

## 2020-05-30 DIAGNOSIS — F1590 Other stimulant use, unspecified, uncomplicated: Secondary | ICD-10-CM | POA: Diagnosis not present

## 2020-05-30 DIAGNOSIS — K2900 Acute gastritis without bleeding: Secondary | ICD-10-CM | POA: Diagnosis not present

## 2020-05-30 DIAGNOSIS — R1013 Epigastric pain: Secondary | ICD-10-CM | POA: Diagnosis present

## 2020-05-30 DIAGNOSIS — N390 Urinary tract infection, site not specified: Secondary | ICD-10-CM | POA: Insufficient documentation

## 2020-05-30 DIAGNOSIS — R5383 Other fatigue: Secondary | ICD-10-CM | POA: Insufficient documentation

## 2020-05-30 DIAGNOSIS — R6883 Chills (without fever): Secondary | ICD-10-CM | POA: Insufficient documentation

## 2020-05-30 DIAGNOSIS — Z20822 Contact with and (suspected) exposure to covid-19: Secondary | ICD-10-CM | POA: Diagnosis not present

## 2020-05-30 LAB — CBC WITH DIFFERENTIAL/PLATELET
Abs Immature Granulocytes: 0.04 10*3/uL (ref 0.00–0.07)
Basophils Absolute: 0 10*3/uL (ref 0.0–0.1)
Basophils Relative: 0 %
Eosinophils Absolute: 0.1 10*3/uL (ref 0.0–0.5)
Eosinophils Relative: 1 %
HCT: 40 % (ref 36.0–46.0)
Hemoglobin: 13.5 g/dL (ref 12.0–15.0)
Immature Granulocytes: 0 %
Lymphocytes Relative: 14 %
Lymphs Abs: 1.4 10*3/uL (ref 0.7–4.0)
MCH: 29.1 pg (ref 26.0–34.0)
MCHC: 33.8 g/dL (ref 30.0–36.0)
MCV: 86.2 fL (ref 80.0–100.0)
Monocytes Absolute: 0.6 10*3/uL (ref 0.1–1.0)
Monocytes Relative: 6 %
Neutro Abs: 7.5 10*3/uL (ref 1.7–7.7)
Neutrophils Relative %: 79 %
Platelets: 325 10*3/uL (ref 150–400)
RBC: 4.64 MIL/uL (ref 3.87–5.11)
RDW: 12.5 % (ref 11.5–15.5)
WBC: 9.7 10*3/uL (ref 4.0–10.5)
nRBC: 0 % (ref 0.0–0.2)

## 2020-05-30 LAB — COMPREHENSIVE METABOLIC PANEL
ALT: 10 U/L (ref 0–44)
AST: 17 U/L (ref 15–41)
Albumin: 4.2 g/dL (ref 3.5–5.0)
Alkaline Phosphatase: 37 U/L — ABNORMAL LOW (ref 38–126)
Anion gap: 12 (ref 5–15)
BUN: 14 mg/dL (ref 6–20)
CO2: 21 mmol/L — ABNORMAL LOW (ref 22–32)
Calcium: 8.8 mg/dL — ABNORMAL LOW (ref 8.9–10.3)
Chloride: 103 mmol/L (ref 98–111)
Creatinine, Ser: 0.79 mg/dL (ref 0.44–1.00)
GFR calc Af Amer: >60 mL/min (ref >60)
GFR calc non Af Amer: >60 mL/min (ref >60)
Glucose, Bld: 97 mg/dL (ref 70–99)
Potassium: 3.8 mmol/L (ref 3.5–5.1)
Sodium: 136 mmol/L (ref 135–145)
Total Bilirubin: 0.7 mg/dL (ref 0.3–1.2)
Total Protein: 7.4 g/dL (ref 6.5–8.1)

## 2020-05-30 LAB — PREGNANCY, URINE: Preg Test, Ur: NEGATIVE

## 2020-05-30 LAB — URINALYSIS, ROUTINE W REFLEX MICROSCOPIC
Bilirubin Urine: NEGATIVE
Glucose, UA: NEGATIVE mg/dL
Ketones, ur: 15 mg/dL — AB
Leukocytes,Ua: NEGATIVE
Nitrite: NEGATIVE
Protein, ur: NEGATIVE mg/dL
Specific Gravity, Urine: 1.025 (ref 1.005–1.030)
pH: 6 (ref 5.0–8.0)

## 2020-05-30 LAB — URINALYSIS, MICROSCOPIC (REFLEX)

## 2020-05-30 LAB — SARS CORONAVIRUS 2 BY RT PCR (HOSPITAL ORDER, PERFORMED IN ~~LOC~~ HOSPITAL LAB): SARS Coronavirus 2: NEGATIVE

## 2020-05-30 LAB — LIPASE, BLOOD: Lipase: 24 U/L (ref 11–51)

## 2020-05-30 MED ORDER — ALUM & MAG HYDROXIDE-SIMETH 200-200-20 MG/5ML PO SUSP
30.0000 mL | Freq: Once | ORAL | Status: AC
  Administered 2020-05-30: 30 mL via ORAL
  Filled 2020-05-30: qty 30

## 2020-05-30 MED ORDER — LIDOCAINE VISCOUS HCL 2 % MT SOLN
15.0000 mL | Freq: Once | OROMUCOSAL | Status: AC
  Administered 2020-05-30: 15 mL via ORAL
  Filled 2020-05-30: qty 15

## 2020-05-30 MED ORDER — DICYCLOMINE HCL 10 MG/ML IM SOLN
20.0000 mg | Freq: Once | INTRAMUSCULAR | Status: AC
  Administered 2020-05-30: 20 mg via INTRAMUSCULAR
  Filled 2020-05-30: qty 2

## 2020-05-30 MED ORDER — PANTOPRAZOLE SODIUM 20 MG PO TBEC
20.0000 mg | DELAYED_RELEASE_TABLET | Freq: Every day | ORAL | 0 refills | Status: DC
Start: 2020-05-30 — End: 2020-12-02

## 2020-05-30 MED ORDER — CEPHALEXIN 500 MG PO CAPS
500.0000 mg | ORAL_CAPSULE | Freq: Two times a day (BID) | ORAL | 0 refills | Status: AC
Start: 2020-05-30 — End: 2020-06-06

## 2020-05-30 MED ORDER — ONDANSETRON 4 MG PO TBDP
4.0000 mg | ORAL_TABLET | Freq: Once | ORAL | Status: AC
  Administered 2020-05-30: 4 mg via ORAL
  Filled 2020-05-30: qty 1

## 2020-05-30 MED FILL — PANTOPRAZOLE SOD DR 20 MG T: 20 | 14 days supply | Qty: 14 | Fill #0

## 2020-05-30 MED FILL — CEPHALEXIN 500 MG CAPSULE: 500 | 7 days supply | Qty: 14 | Fill #0

## 2020-05-30 NOTE — ED Notes (Signed)
ED Provider at bedside. 

## 2020-05-30 NOTE — ED Triage Notes (Signed)
Patient stated that 2 days ago, she took her mother's medicine, Hydrochlorothiazide, hoping that it will help her menstrual cramps.  2 hours ago, she felt numbness and tingling from head to abdominal area.

## 2020-05-30 NOTE — ED Notes (Signed)
Patient stated that she always felt nauseous when she is on her period.
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
## 2020-09-18 ENCOUNTER — Telehealth: Payer: Self-pay

## 2020-09-18 NOTE — Progress Notes (Signed)
Patient did not show for appointment.   

## 2020-09-18 NOTE — Telephone Encounter (Signed)
Att to contact pt to confirm appt and verify request for appt on 12/17 because notes states pt request Hydrocodone for menstrual cramps, unfortunately provider Amy does not prescribe opioids. lvm for pt to call ofc to discuss.

## 2020-09-19 ENCOUNTER — Encounter: Payer: Medicaid Other | Admitting: Family

## 2020-10-21 ENCOUNTER — Other Ambulatory Visit: Payer: Self-pay

## 2020-10-21 ENCOUNTER — Inpatient Hospital Stay (HOSPITAL_COMMUNITY)
Admission: AD | Admit: 2020-10-21 | Discharge: 2020-10-21 | Disposition: A | Discharge status: Home / Self Care | Payer: Medicaid Other | Attending: Obstetrics and Gynecology | Admitting: Obstetrics and Gynecology

## 2020-10-21 DIAGNOSIS — Z3202 Encounter for pregnancy test, result negative: Secondary | ICD-10-CM

## 2020-10-21 DIAGNOSIS — N946 Dysmenorrhea, unspecified: Secondary | ICD-10-CM | POA: Diagnosis present

## 2020-10-21 DIAGNOSIS — R102 Pelvic and perineal pain: Secondary | ICD-10-CM | POA: Diagnosis not present

## 2020-10-21 LAB — POCT PREGNANCY, URINE: Preg Test, Ur: NEGATIVE

## 2020-10-21 NOTE — MAU Note (Addendum)
Patient is having severe abdominal pain with her periods and states she is wanting an U/S to determine what is causing so much pain.  States she is currently on her period but skipped last month.

## 2020-10-21 NOTE — MAU Provider Note (Signed)
Event Date/Time   First Provider Initiated Contact with Patient 10/21/20 1059      S [name] is a 20 y.o. [id number] patient who presents to MAU today with complaint of menstrual cramping. Patient reports her menstrual cramping is a "long standing issue" for which she has not yet been evaluated. Patient is here today for an ultrasound to determine the cause of the problem and because her boyfriend thinks she is pregnant. Patient reports she is on her period at this time and rates her menstrual cramping as 6/10. Patient reports she has not taken any medication for her cramps. Patient appears to be resting comfortably in triage and is texting on phone while provider is talking with her.  O BP 105/61   Pulse 80   Temp 98.4 F (36.9 C)   Resp 17    Physical Exam Vitals and nursing note reviewed.  Constitutional:      General: She is not in acute distress.    Appearance: Normal appearance. She is not ill-appearing, toxic-appearing or diaphoretic.  HENT:     Head: Normocephalic and atraumatic.  Pulmonary:     Effort: Pulmonary effort is normal.  Neurological:     Mental Status: She is alert and oriented to person, place, and time.  Psychiatric:        Mood and Affect: Mood normal.        Behavior: Behavior normal.        Thought Content: Thought content normal.        Judgment: Judgment normal.     A Medical screening exam complete UPT negative Menstrual cramping  P Discharge from MAU in stable condition Patient given the option of being seen in [facility] for further evaluation or seek care in outpatient facility of choice List of options for follow-up given to include EDs, Urgent Care, OB/GYN offices Warning signs for worsening condition that would warrant emergency follow-up discussed Patient may return to MAU as needed   Tamera Pingley, Odie Sera, NP 10/21/2020 11:10 AM

## 2020-11-24 ENCOUNTER — Ambulatory Visit: Payer: Medicaid Other | Admitting: Advanced Practice Midwife

## 2020-12-01 ENCOUNTER — Ambulatory Visit (HOSPITAL_COMMUNITY)
Admission: AD | Admit: 2020-12-01 | Discharge: 2020-12-01 | Disposition: A | Discharge status: Home / Self Care | Payer: Medicaid Other | Attending: Emergency Medicine | Admitting: Emergency Medicine

## 2020-12-01 ENCOUNTER — Inpatient Hospital Stay (HOSPITAL_COMMUNITY)
Admission: RE | Admit: 2020-12-01 | Discharge: 2020-12-05 | DRG: 885 | Disposition: A | Discharge status: Home / Self Care | Payer: Medicaid Other | Source: Intra-hospital | Attending: Behavioral Health | Admitting: Behavioral Health

## 2020-12-01 DIAGNOSIS — Z9152 Personal history of nonsuicidal self-harm: Secondary | ICD-10-CM | POA: Diagnosis not present

## 2020-12-01 DIAGNOSIS — F419 Anxiety disorder, unspecified: Secondary | ICD-10-CM | POA: Diagnosis present

## 2020-12-01 DIAGNOSIS — F909 Attention-deficit hyperactivity disorder, unspecified type: Secondary | ICD-10-CM | POA: Diagnosis present

## 2020-12-01 DIAGNOSIS — R45851 Suicidal ideations: Secondary | ICD-10-CM | POA: Diagnosis present

## 2020-12-01 DIAGNOSIS — F332 Major depressive disorder, recurrent severe without psychotic features: Principal | ICD-10-CM | POA: Diagnosis present

## 2020-12-01 DIAGNOSIS — B009 Herpesviral infection, unspecified: Secondary | ICD-10-CM | POA: Diagnosis present

## 2020-12-01 DIAGNOSIS — Z9151 Personal history of suicidal behavior: Secondary | ICD-10-CM

## 2020-12-01 DIAGNOSIS — Z818 Family history of other mental and behavioral disorders: Secondary | ICD-10-CM

## 2020-12-01 DIAGNOSIS — G4700 Insomnia, unspecified: Secondary | ICD-10-CM | POA: Diagnosis present

## 2020-12-01 HISTORY — DX: Bipolar disorder, unspecified: F31.9

## 2020-12-01 HISTORY — DX: Anxiety disorder, unspecified: F41.9

## 2020-12-01 LAB — RESP PANEL BY RT-PCR (FLU A&B, COVID) ARPGX2
Influenza A by PCR: NEGATIVE
Influenza B by PCR: NEGATIVE
SARS Coronavirus 2 by RT PCR: NEGATIVE

## 2020-12-01 MED ORDER — HYDROXYZINE HCL 25 MG PO TABS
25.0000 mg | ORAL_TABLET | Freq: Three times a day (TID) | ORAL | Status: DC | PRN
  Filled 2020-12-01: qty 1

## 2020-12-01 MED ORDER — TRAZODONE HCL 50 MG PO TABS
50.0000 mg | ORAL_TABLET | Freq: Every evening | ORAL | Status: DC | PRN
  Filled 2020-12-01: qty 1

## 2020-12-01 MED ORDER — ALUM & MAG HYDROXIDE-SIMETH 200-200-20 MG/5ML PO SUSP
30.0000 mL | ORAL | Status: DC | PRN
  Filled 2020-12-01: qty 30

## 2020-12-01 MED ORDER — ACETAMINOPHEN 325 MG PO TABS
650.0000 mg | ORAL_TABLET | Freq: Four times a day (QID) | ORAL | Status: DC | PRN
  Filled 2020-12-01: qty 2

## 2020-12-01 MED ORDER — MAGNESIUM HYDROXIDE 400 MG/5ML PO SUSP: 30.0000 mL | Freq: Every day | ORAL | Status: DC | PRN

## 2020-12-01 MED ORDER — ALUM & MAG HYDROXIDE-SIMETH 200-200-20 MG/5ML PO SUSP: 30.0000 mL | ORAL | Status: DC | PRN

## 2020-12-01 MED ORDER — MAGNESIUM HYDROXIDE 400 MG/5ML PO SUSP
30.0000 mL | Freq: Every day | ORAL | Status: DC | PRN
  Filled 2020-12-01: qty 30

## 2020-12-01 MED ORDER — ACETAMINOPHEN 325 MG PO TABS: 650.0000 mg | ORAL_TABLET | Freq: Four times a day (QID) | ORAL | Status: DC | PRN

## 2020-12-01 NOTE — Progress Notes (Signed)
Psychoeducational Group Note  Date:  12/01/2020 Time:  2210  Group Topic/Focus:  Wrap-Up Group:   The focus of this group is to help patients review their daily goal of treatment and discuss progress on daily workbooks.  Participation Level: Did Not Attend  Participation Quality:  Not Applicable  Affect:  Not Applicable  Cognitive:  Not Applicable  Insight:  Not Applicable  Engagement in Group: Not Applicable  Additional Comments:  The patient did not attend group since she was admitted to the hallway after the group concluded.   Tandy Grawe S 12/01/2020, 10:10 PM

## 2020-12-01 NOTE — H&P (Signed)
Behavioral Health Medical Screening Exam  Suzanne Garcia is an 20 y.o. female with history of depression and anxiety. She is presenting for suicidal thoughts to overdose on pills. She reports SI started two months ago and does not feel she can keep herself safe at home. She identifies relationship issues as trigger but declines to elaborate. Denies HI/AVH.   Total Time spent with patient: 15 minutes  Psychiatric Specialty Exam: Physical Exam Vitals and nursing note reviewed.  Constitutional:      Appearance: She is well-developed and well-nourished.  Cardiovascular:     Rate and Rhythm: Normal rate.  Pulmonary:     Effort: Pulmonary effort is normal.  Neurological:     Mental Status: She is alert and oriented to person, place, and time.    Review of Systems  Constitutional: Negative.   Respiratory: Negative for cough and shortness of breath.   Psychiatric/Behavioral: Positive for dysphoric mood, self-injury and suicidal ideas. Negative for agitation, behavioral problems, confusion, decreased concentration and hallucinations. The patient is nervous/anxious. The patient is not hyperactive.    Blood pressure 114/67, pulse 67, temperature 98.8 F (37.1 C), temperature source Oral, resp. rate 18, SpO2 99 %.There is no height or weight on file to calculate BMI. General Appearance: Casual Eye Contact:  None Speech:  Normal Rate Volume:  Decreased Mood:  Anxious and Depressed Affect:  Non-Congruent Thought Process:  Coherent Orientation:  Full (Time, Place, and Person) Thought Content:  Logical Suicidal Thoughts:  Yes.  with intent/plan Homicidal Thoughts:  No Memory:  Immediate;   Fair Recent;   Fair Remote;   Fair Judgement:  Fair Insight:  Fair Psychomotor Activity:  Normal Concentration: Concentration: Fair and Attention Span: Fair Recall:  [employer] of Knowledge:Fair Language: Fair Akathisia:  No Handed:  Right AIMS (if indicated):    Assets:  Communication  Skills Desire for Improvement Financial Resources/Insurance Housing Social Support Sleep:     Musculoskeletal: Strength & Muscle Tone: within normal limits Gait & Station: normal Patient leans: N/A  Blood pressure 114/67, pulse 67, temperature 98.8 F (37.1 C), temperature source Oral, resp. rate 18, SpO2 99 %.  Recommendations: Based on my evaluation the patient does not appear to have an emergency medical condition.  Inpatient hospitalization.  Aldean Baker, NP 12/01/2020, 5:45 PM
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
## 2020-12-02 ENCOUNTER — Other Ambulatory Visit: Payer: Self-pay

## 2020-12-02 ENCOUNTER — Encounter (HOSPITAL_COMMUNITY): Payer: Self-pay | Admitting: Psychiatric/Mental Health

## 2020-12-02 DIAGNOSIS — F332 Major depressive disorder, recurrent severe without psychotic features: Secondary | ICD-10-CM | POA: Diagnosis not present

## 2020-12-02 DIAGNOSIS — R45851 Suicidal ideations: Secondary | ICD-10-CM

## 2020-12-02 LAB — TSH: TSH: 1.614 u[IU]/mL (ref 0.350–4.500)

## 2020-12-02 LAB — URINALYSIS, ROUTINE W REFLEX MICROSCOPIC
Bilirubin Urine: NEGATIVE
Glucose, UA: NEGATIVE mg/dL
Hgb urine dipstick: NEGATIVE
Ketones, ur: 20 mg/dL — AB
Leukocytes,Ua: NEGATIVE
Nitrite: NEGATIVE
Protein, ur: NEGATIVE mg/dL
Specific Gravity, Urine: 1.029 (ref 1.005–1.030)
pH: 5 (ref 5.0–8.0)

## 2020-12-02 LAB — COMPREHENSIVE METABOLIC PANEL
ALT: 11 U/L (ref 0–44)
AST: 17 U/L (ref 15–41)
Albumin: 4.2 g/dL (ref 3.5–5.0)
Alkaline Phosphatase: 37 U/L — ABNORMAL LOW (ref 38–126)
Anion gap: 11 (ref 5–15)
BUN: 16 mg/dL (ref 6–20)
CO2: 23 mmol/L (ref 22–32)
Calcium: 9 mg/dL (ref 8.9–10.3)
Chloride: 106 mmol/L (ref 98–111)
Creatinine, Ser: 0.53 mg/dL (ref 0.44–1.00)
GFR, Estimated: >60 mL/min (ref >60)
Glucose, Bld: 72 mg/dL (ref 70–99)
Potassium: 3.8 mmol/L (ref 3.5–5.1)
Sodium: 140 mmol/L (ref 135–145)
Total Bilirubin: 0.9 mg/dL (ref 0.3–1.2)
Total Protein: 7.1 g/dL (ref 6.5–8.1)

## 2020-12-02 LAB — CBC
HCT: 38.2 % (ref 36.0–46.0)
Hemoglobin: 12.9 g/dL (ref 12.0–15.0)
MCH: 30.1 pg (ref 26.0–34.0)
MCHC: 33.8 g/dL (ref 30.0–36.0)
MCV: 89 fL (ref 80.0–100.0)
Platelets: 287 10*3/uL (ref 150–400)
RBC: 4.29 MIL/uL (ref 3.87–5.11)
RDW: 12.5 % (ref 11.5–15.5)
WBC: 5.2 10*3/uL (ref 4.0–10.5)
nRBC: 0 % (ref 0.0–0.2)

## 2020-12-02 LAB — RAPID URINE DRUG SCREEN, HOSP PERFORMED
Amphetamines: NOT DETECTED
Barbiturates: NOT DETECTED
Benzodiazepines: NOT DETECTED
Cocaine: NOT DETECTED
Opiates: NOT DETECTED
Tetrahydrocannabinol: NOT DETECTED

## 2020-12-02 LAB — LIPID PANEL
Cholesterol: 158 mg/dL (ref 0–200)
HDL: 58 mg/dL (ref >40)
LDL Cholesterol: 94 mg/dL (ref 0–99)
Total CHOL/HDL Ratio: 2.7 RATIO
Triglycerides: 28 mg/dL (ref <150)
VLDL: 6 mg/dL (ref 0–40)

## 2020-12-02 LAB — ETHANOL: Alcohol, Ethyl (B): <10 mg/dL (ref <10)

## 2020-12-02 LAB — HEMOGLOBIN A1C
Hgb A1c MFr Bld: 5 % (ref 4.8–5.6)
Mean Plasma Glucose: 96.8 mg/dL

## 2020-12-02 LAB — PREGNANCY, URINE: Preg Test, Ur: NEGATIVE

## 2020-12-02 MED ORDER — MIRTAZAPINE 15 MG PO TBDP
15.0000 mg | ORAL_TABLET | Freq: Every day | ORAL | Status: DC
  Administered 2020-12-02 – 2020-12-04 (×3): 15 mg via ORAL
  Filled 2020-12-02 (×5): qty 1

## 2020-12-02 MED ORDER — ENSURE ENLIVE PO LIQD
237.0000 mL | Freq: Two times a day (BID) | ORAL | Status: DC
  Administered 2020-12-02 – 2020-12-05 (×6): 237 mL via ORAL
  Filled 2020-12-02 (×9): qty 237

## 2020-12-02 NOTE — H&P (Addendum)
H&P  12/02/2020 1:16 PM Suzanne Garcia  MRN:  [id number] Subjective: Patient reports "my mood is sad, depressed and angry."  Patient reports she feels sad related to the fact that her boyfriend recently cheated on her and gave her a sexually transmitted infection.  Patient reports prior to presenting to the hospital she experienced suicidal ideations.  Patient contracts verbally for safety with this Clinical research associate.  Patient reports she has a history of 3 prior suicide attempts with 3 inpatient psychiatric admissions.  Patient reports she has not followed up with outpatient therapy in the past but does understand the importance of outpatient follow-up currently.  Patient reports she has a history of self-harm behaviors.  Patient reports she cuts herself with a razor blade.  Patient reports last cutting episode approximately 2 weeks ago.  Patient raises her arm to show this writer scars to left anterior forearm.  Patient reports decreased sleep and decreased appetite.   Patient reports she currently feels depressed.  Patient rates depression as 7 out of 10 on a scale of 1-10 with 10 being most depressed.  Patient reports she feels mildly anxious.  Patient reports anxiety is 3 out of 10, on a scale of 1-10 with 10 being the most anxious.  Patient assessed by nurse practitioner.  Patient found seated in room upon my approach.  Patient alert and oriented, pleasant and cooperative with assessment.  Patient denies suicidal and homicidal ideations currently.  Patient currently resides in [place] with her boyfriend but is considering moving from his home.  Patient is currently employed as a Civil Service fast streamer.  Patient denies alcohol and substance use.  Principal Problem: MDD (major depressive disorder), recurrent severe, without psychosis (HCC) Diagnosis: Principal Problem:   MDD (major depressive disorder), recurrent severe, without psychosis (HCC)  Total Time spent with patient: 45 minutes  Past Psychiatric  History: Major depressive disorder, recurrent severe, suicidal ideation  Past Medical History:  Past Medical History:  Diagnosis Date  . Anxiety   . Bipolar disorder (HCC)   . Depression     Past Surgical History:  Procedure Laterality Date  . NO PAST SURGERIES     Family History:  Family History  Problem Relation Age of Onset  . Obesity Mother   . Asthma Mother   . Miscarriages / [place] Mother   . Diabetes Mother   . Depression Mother   . Mental illness Father   . ADD / ADHD Sister   . Bipolar disorder Sister   . Heart disease Maternal Grandmother   . Diabetes Maternal Grandmother   . Bipolar disorder Maternal Grandmother   . Bipolar disorder Maternal Grandfather   . Multiple sclerosis Maternal Grandfather    Family Psychiatric  History: None reported Social History:  Social History   Substance and Sexual Activity  Alcohol Use Yes   Comment: last drink was in November, occasionally drinker (3 shots)     Social History   Substance and Sexual Activity  Drug Use Yes  . Types: Marijuana   Comment: last use of "weed" in Dec. (prior use was once every other week)    Social History   Socioeconomic History  . Marital status: Single    Spouse name: Not on file  . Number of children: Not on file  . Years of education: Not on file  . Highest education level: Not on file  Occupational History  . Not on file  Tobacco Use  . Smoking status: Never Smoker  . Smokeless tobacco: Never Used  Vaping Use  . Vaping Use: Never used  Substance and Sexual Activity  . Alcohol use: Yes    Comment: last drink was in November, occasionally drinker (3 shots)  . Drug use: Yes    Types: Marijuana    Comment: last use of "weed" in Dec. (prior use was once every other week)  . Sexual activity: Yes    Partners: Male    Birth control/protection: None  Other Topics Concern  . Not on file  Social History Narrative   Born at [facility] in [place]. Jaundice in NBN.      Social  Determinants of Health   Financial Resource Strain: Not on file  Food Insecurity: No Food Insecurity  . Worried About Programme researcher, broadcasting/film/video in the Last Year: Never true  . Ran Out of Food in the Last Year: Never true  Transportation Needs: No Transportation Needs  . Lack of Transportation (Medical): No  . Lack of Transportation (Non-Medical): No  Physical Activity: Not on file  Stress: Not on file  Social Connections: Not on file   Additional Social History:                         Sleep: Fair  Appetite:  Good  Current Medications: Current Facility-Administered Medications  Medication Dose Route Frequency Provider Last Rate Last Admin  . acetaminophen (TYLENOL) tablet 650 mg  650 mg Oral Q6H PRN Antonieta Pert, MD      . alum & mag hydroxide-simeth (MAALOX/MYLANTA) 200-200-20 MG/5ML suspension 30 mL  30 mL Oral Q4H PRN Antonieta Pert, MD      . feeding supplement (ENSURE ENLIVE / ENSURE PLUS) liquid 237 mL  237 mL Oral BID BM Antonieta Pert, MD   237 mL at 12/02/20 0931  . hydrOXYzine (ATARAX/VISTARIL) tablet 25 mg  25 mg Oral TID PRN Antonieta Pert, MD      . magnesium hydroxide (MILK OF MAGNESIA) suspension 30 mL  30 mL Oral Daily PRN Antonieta Pert, MD      . traZODone (DESYREL) tablet 50 mg  50 mg Oral QHS PRN Antonieta Pert, MD        Lab Results:  Results for orders placed or performed during the hospital encounter of 12/01/20 (from the past 48 hour(s))  Pregnancy, urine     Status: None   Collection Time: 12/01/20 11:20 PM  Result Value Ref Range   Preg Test, Ur NEGATIVE NEGATIVE    Comment:        THE SENSITIVITY OF THIS METHODOLOGY IS >20 mIU/mL. Performed at [facility], [address], [place], [place] [address]   Urinalysis, Routine w reflex microscopic Urine, Clean Catch     Status: Abnormal   Collection Time: 12/01/20 11:20 PM  Result Value Ref Range   Color, Urine YELLOW YELLOW   APPearance HAZY (A) CLEAR    Specific Gravity, Urine 1.029 1.005 - 1.030   pH 5.0 5.0 - 8.0   Glucose, UA NEGATIVE NEGATIVE mg/dL   Hgb urine dipstick NEGATIVE NEGATIVE   Bilirubin Urine NEGATIVE NEGATIVE   Ketones, ur 20 (A) NEGATIVE mg/dL   Protein, ur NEGATIVE NEGATIVE mg/dL   Nitrite NEGATIVE NEGATIVE   Leukocytes,Ua NEGATIVE NEGATIVE    Comment: Performed at [facility], [address] [place], [place] [address]  Urine rapid drug screen (hosp performed)not at [facility]     Status: None   Collection Time: 12/01/20 11:20  PM  Result Value Ref Range   Opiates NONE DETECTED NONE DETECTED   Cocaine NONE DETECTED NONE DETECTED   Benzodiazepines NONE DETECTED NONE DETECTED   Amphetamines NONE DETECTED NONE DETECTED   Tetrahydrocannabinol NONE DETECTED NONE DETECTED   Barbiturates NONE DETECTED NONE DETECTED    Comment: (NOTE) DRUG SCREEN FOR MEDICAL PURPOSES ONLY.  IF CONFIRMATION IS NEEDED FOR ANY PURPOSE, NOTIFY LAB WITHIN 5 DAYS.  LOWEST DETECTABLE LIMITS FOR URINE DRUG SCREEN Drug Class                     Cutoff (ng/mL) Amphetamine and metabolites    1000 Barbiturate and metabolites    200 Benzodiazepine                 200 Tricyclics and metabolites     300 Opiates and metabolites        300 Cocaine and metabolites        300 THC                            50 Performed at [facility], [address], [place], [place] [address]   CBC     Status: None   Collection Time: 12/02/20  6:24 AM  Result Value Ref Range   WBC 5.2 4.0 - 10.5 K/uL   RBC 4.29 3.87 - 5.11 MIL/uL   Hemoglobin 12.9 12.0 - 15.0 g/dL   HCT 88.5 02.7 - 74.1 %   MCV 89.0 80.0 - 100.0 fL   MCH 30.1 26.0 - 34.0 pg   MCHC 33.8 30.0 - 36.0 g/dL   RDW 28.7 86.7 - 67.2 %   Platelets 287 150 - 400 K/uL   nRBC 0.0 0.0 - 0.2 %    Comment: Performed at [facility], [address] [place], [place] [address]  Comprehensive metabolic panel     Status: Abnormal   Collection Time:  12/02/20  6:24 AM  Result Value Ref Range   Sodium 140 135 - 145 mmol/L   Potassium 3.8 3.5 - 5.1 mmol/L   Chloride 106 98 - 111 mmol/L   CO2 23 22 - 32 mmol/L   Glucose, Bld 72 70 - 99 mg/dL    Comment: Glucose reference range applies only to samples taken after fasting for at least 8 hours.   BUN 16 6 - 20 mg/dL   Creatinine, Ser 9.62 0.44 - 1.00 mg/dL   Calcium 9.0 8.9 - 83.6 mg/dL   Total Protein 7.1 6.5 - 8.1 g/dL   Albumin 4.2 3.5 - 5.0 g/dL   AST 17 15 - 41 U/L   ALT 11 0 - 44 U/L   Alkaline Phosphatase 37 (L) 38 - 126 U/L   Total Bilirubin 0.9 0.3 - 1.2 mg/dL   GFR, Estimated >62 >94 mL/min    Comment: (NOTE) Calculated using the CKD-EPI Creatinine Equation (2021)    Anion gap 11 5 - 15    Comment: Performed at [facility], [address], [place], [place] [address]  Hemoglobin A1c     Status: None   Collection Time: 12/02/20  6:24 AM  Result Value Ref Range   Hgb A1c MFr Bld 5.0 4.8 - 5.6 %    Comment: (NOTE) Pre diabetes:          5.7%-6.4%  Diabetes:              >6.4%  Glycemic control for   <7.0% adults with diabetes    Mean Plasma Glucose 96.8 mg/dL    Comment: Performed at [facility], [address], [place], [place] [address]  Ethanol     Status: None   Collection Time: 12/02/20  6:24 AM  Result Value Ref Range   Alcohol, Ethyl (B) <10 <10 mg/dL    Comment: (NOTE) Lowest detectable limit for serum alcohol is 10 mg/dL.  For medical purposes only. Performed at [facility], [address], [place], [place] [address]   Lipid panel     Status: None   Collection Time: 12/02/20  6:24 AM  Result Value Ref Range   Cholesterol 158 0 - 200 mg/dL   Triglycerides 28 <300 mg/dL   HDL 58 >76 mg/dL   Total CHOL/HDL Ratio 2.7 RATIO   VLDL 6 0 - 40 mg/dL   LDL Cholesterol 94 0 - 99 mg/dL    Comment:        Total Cholesterol/HDL:CHD Risk Coronary Heart Disease Risk Table                     Men   Women  1/2  Average Risk   3.4   3.3  Average Risk       5.0   4.4  2 X Average Risk   9.6   7.1  3 X Average Risk  23.4   11.0        Use the calculated Patient Ratio above and the CHD Risk Table to determine the patient's CHD Risk.        ATP III CLASSIFICATION (LDL):  <100     mg/dL   Optimal  226-333  mg/dL   Near or Above                    Optimal  130-159  mg/dL   Borderline  545-625  mg/dL   High  >638     mg/dL   Very High Performed at [facility], [address] [place], [place] [address]   TSH     Status: None   Collection Time: 12/02/20  6:24 AM  Result Value Ref Range   TSH 1.614 0.350 - 4.500 uIU/mL    Comment: Performed by a 3rd Generation assay with a functional sensitivity of <=0.01 uIU/mL. Performed at [facility], [address], [place], [place] [address]     Blood Alcohol level:  Lab Results  Component Value Date   [place] <10 12/02/2020   ETH <5 04/29/2015    Metabolic Disorder Labs: Lab Results  Component Value Date   HGBA1C 5.0 12/02/2020   MPG 96.8 12/02/2020   MPG 99.67 05/27/2018   Lab Results  Component Value Date   PROLACTIN 40.7 (H) 05/30/2018   PROLACTIN 49.0 (H) 05/27/2018   Lab Results  Component Value Date   CHOL 158 12/02/2020   TRIG 28 12/02/2020   HDL 58 12/02/2020   CHOLHDL 2.7 12/02/2020   VLDL 6 12/02/2020   LDLCALC 94 12/02/2020   LDLCALC 70 05/27/2018    Physical Findings: AIMS: Facial and Oral Movements Muscles of Facial Expression: None, normal Lips and Perioral Area: None, normal Jaw: None, normal Tongue: None, normal,Extremity Movements Upper (arms, wrists, hands, fingers): None, normal Lower (legs, knees, ankles, toes): None, normal, Trunk Movements Neck, shoulders, hips: None, normal, Overall Severity Severity of abnormal movements (highest score from questions above): None, normal Incapacitation  due to abnormal movements: None, normal Patient's awareness of abnormal movements  (rate only patient's report): No Awareness, Dental Status Current problems with teeth and/or dentures?: No Does patient usually wear dentures?: No  CIWA:  CIWA-Ar Total: 2 COWS:  COWS Total Score: 1  Musculoskeletal: Strength & Muscle Tone: within normal limits Gait & Station: normal Patient leans: N/A  Psychiatric Specialty Exam: Physical Exam Vitals and nursing note reviewed.  Constitutional:      Appearance: She is well-developed.  HENT:     Head: Normocephalic.  Cardiovascular:     Rate and Rhythm: Normal rate.  Pulmonary:     Effort: Pulmonary effort is normal.  Neurological:     Mental Status: She is alert and oriented to person, place, and time.     Review of Systems  Blood pressure 106/67, pulse 84, temperature 98.3 F (36.8 C), temperature source Oral, resp. rate 18, height 4\' 10"  (1.473 m), weight 51.3 kg, SpO2 98 %.Body mass index is 23.62 kg/m.  General Appearance: Casual  Eye Contact:  Fair  Speech:  Clear and Coherent and Normal Rate  Volume:  Decreased  Mood:  Depressed  Affect:  Depressed  Thought Process:  Coherent, Goal Directed and Descriptions of Associations: Intact  Orientation:  Full (Time, Place, and Person)  Thought Content:  Logical  Suicidal Thoughts:  No  Homicidal Thoughts:  No  Memory:  Immediate;   Good Recent;   Good Remote;   Good  Judgement:  Fair  Insight:  Lacking  Psychomotor Activity:  Normal  Concentration:  Concentration: Good and Attention Span: Good  Recall:  Good  Fund of Knowledge:  Good  Language:  Good  Akathisia:  No  Handed:  Right  AIMS (if indicated):     Assets:  Communication Skills Desire for Improvement Financial Resources/Insurance Housing Intimacy Leisure Time Physical Health Resilience Social Support Talents/Skills Transportation  ADL's:  Intact  Cognition:  WNL  Sleep:  Number of Hours: 6.5     Treatment Plan Summary: Patient reviewed with Dr. Lucianne MussKumar. Daily contact with patient to assess  and evaluate symptoms and progress in treatment  Will maintain every 15 minute observation for safety. Patient will participate in group and milieu. We will continue to monitor patient's mood and behavior. Social work will be consulted to begin disposition planning.  Depression. Start Mirtazepine 15mg  QHS  Insomnia. Continue trazodone 50mg  QHS/PRN  Decreased appetite. Feeding supplement Ensure 237mL BID between meals  Anxiety. Continue hydroxyzine 25mg  TID PRN anxiety  As needed medications: -tylenol 650mg  Q6 PRN pain -maalox 30mL Q4 PRN indigestion -milk of magnesia 30mL daily PRN/constipation  Patrcia Dollyina L Tate, FNP 12/02/2020, 1:16 PM

## 2020-12-02 NOTE — Progress Notes (Signed)
NUTRITION ASSESSMENT RD working remotely.  Pt identified as at risk on the Malnutrition Screen Tool  INTERVENTION: - continue Ensure Enlive BID, each supplement provides 350 kcal and 20 grams of protein.  NUTRITION DIAGNOSIS: Unintentional weight loss related to sub-optimal intake as evidenced by pt report.   Goal: Pt to meet >/= 90% of their estimated nutrition needs.  Monitor:  PO intake  Assessment:  Patient admitted for SI with plan to cut her wrists or OD on pills. She reported being suicidal since 09/2020.   Weight yesterday was 113 lb and weight on 01/16/20 was 127 lb. This indicates 14 lb weight loss (11% body weight) in the past 11 months; not significant for time frame.  Ensure Enlive was ordered BID per ONS protocol starting this AM.   20 y.o. female  Height: Ht Readings from Last 1 Encounters:  12/01/20 4\' 10"  (1.473 m)    Weight: Wt Readings from Last 1 Encounters:  12/01/20 51.3 kg    Weight Hx: Wt Readings from Last 10 Encounters:  12/01/20 51.3 kg  05/30/20 50.8 kg (18 %, Z= -0.91)*  04/23/20 53.1 kg (28 %, Z= -0.59)*  03/12/20 54.4 kg (34 %, Z= -0.41)*  03/11/20 57.6 kg (48 %, Z= -0.05)*  01/16/20 57.6 kg (49 %, Z= -0.04)*  10/31/19 53.5 kg (31 %, Z= -0.49)*  05/27/19 58.1 kg (53 %, Z= 0.08)*  05/26/18 53.5 kg (38 %, Z= -0.31)*  05/14/18 55.3 kg (46 %, Z= -0.09)*   * Growth percentiles are based on CDC (Girls, 2-20 Years) data.    BMI:  Body mass index is 23.62 kg/m. Pt meets criteria for normal weight based on current BMI.  Estimated Nutritional Needs: Kcal: 25-30 kcal/kg Protein: > 1 gram protein/kg Fluid: 1 ml/kcal  Diet Order:  Diet Order            Diet regular Room service appropriate? Yes; Fluid consistency: Thin  Diet effective now                Pt is also offered choice of unit snacks mid-morning and mid-afternoon.  Pt is eating as desired.   Lab results and medications reviewed.      07/14/18, MS, RD,  LDN, CNSC Inpatient Clinical Dietitian RD pager # available in [facility]  After hours/weekend pager # available in [facility]

## 2020-12-02 NOTE — Tx Team (Signed)
Initial Treatment Plan 12/01/2020 10:00 PM [name] YOV:[id number]    PATIENT STRESSORS: Marital or family conflict Medication change or noncompliance   PATIENT STRENGTHS: Capable of independent living Wellsite geologist fund of knowledge Motivation for treatment/growth Supportive family/friends   PATIENT IDENTIFIED PROBLEMS: SI with a plan to o/d on pills or cut her wrists  Found out that boyfriend is cheating on her   "anger management"  "loneliness"  "being alone"  depression           DISCHARGE CRITERIA:  Improved stabilization in mood, thinking, and/or behavior Medical problems require only outpatient monitoring Motivation to continue treatment in a less acute level of care Safe-care adequate arrangements made Verbal commitment to aftercare and medication compliance  PRELIMINARY DISCHARGE PLAN: Attend PHP/IOP Outpatient therapy Return to previous living arrangement Return to previous work or school arrangements  PATIENT/FAMILY INVOLVEMENT: This treatment plan has been presented to and reviewed with the patient, Suzanne Garcia, and/or family member. The patient and family have been given the opportunity to ask questions and make suggestions.  Ephraim Hamburger, RN 12/01/2020, 10:00 pm

## 2020-12-02 NOTE — Progress Notes (Signed)
Adult Psychoeducational Group Note  Date:  12/02/2020 Time:  9:00 PM  Group Topic/Focus:  Wrap-Up Group:   The focus of this group is to help patients review their daily goal of treatment and discuss progress on daily workbooks.  Participation Level:  Active  Participation Quality:  Appropriate  Affect:  Appropriate  Cognitive:  Appropriate  Insight: Appropriate  Engagement in Group:  Engaged  Modes of Intervention:  Discussion  Additional Comments:  Patient said her day was a 7. Her goal for today better self care. She really did not meet her goal. The copings skills she found helpful talking laughing writing and listening to music. Her concerns to be on depression medicine. get  Charna Busman Long 12/02/2020, 9:00 PM

## 2020-12-02 NOTE — Progress Notes (Signed)
Adult Psychoeducational Group Note  Date:  12/02/2020 Time:  4:42 PM  Group Topic/Focus:  Goals Group:   The focus of this group is to help patients establish daily goals to achieve during treatment and discuss how the patient can incorporate goal setting into their daily lives to aide in recovery.  Participation Level:  Active  Participation Quality:  Appropriate  Affect:  Appropriate  Cognitive:  Alert  Insight: Appropriate  Engagement in Group:  Engaged  Modes of Intervention:  Discussion  Additional Comments:  Pt attended group and participated in discussion.  Fendi Meinhardt R Alayziah Tangeman 12/02/2020, 4:42 PM

## 2020-12-02 NOTE — Progress Notes (Signed)
Pt is a voluntarily admitted 20 y.o. female presenting to [facility] as a walk in with her mother for SI with a plan to either cut her wrists or overdose on pills. Pt reports feeling suicidal since December. She reports her main stressor being her relationship and that she found out just last night that her boyfriend has cheated on her. She reports that they are still together and that they live together. They have been together for 1.5 years. Pt said that she "couldn't look at myself or him" anymore. She reports another stressor being alone since she doesn't have much support from family or friends. She reports a past hospitalization at [facility] in 2019 for a mental breakdown and suicidal thoughts. She has had 2 past suicidal attempts, one by overdose and the other by cutting. Pt reports some family issues as well. She said "If I'm happy, it's bad and if I'm mad, I'm worse." She said they don't like her and that her Mom has been upset with her because she lives with her boyfriend who she doesn't trust. Pt reports that her boyfriend has been "sneaky, texting other people" and "talking behind my back." She reports a hx of MDD, anxiety, and bipolar disorder. She said that she was taking no medications at home. She had been prescribed an antidepressant and antianxiety medication before which she stopped taking because she thought she would feel better without them. She does not recall the names. She has no psychiatrist or therapist. No recent drugs or alcohol use. She identifies her support person as her mother when she is not mad. 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 Pt said that she cannot eat pork.  Pt denies SI/HI at the time of assessment and verbally contracts for safety. Pt agrees to notify staff immediately for any thoughts of hurting herself or anyone else. Unit rules/consent forms discussed and signed by pt. Belongings search completed. Items allowed on the unit and contraband discussed with pt.  Items secured as necessary in her assigned locker. Food/fluids offered and accepted. Unit tour provided. Q 15 min safety checks initiated. [name] safety has been maintained.
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
## 2020-12-02 NOTE — Progress Notes (Signed)
Pt is guarded on approach and appears to hold back when RN asks pt to describe pt's feelings and concerns.  Pt denied SI/HI/AVH.  Pt attending groups and interacting with peers.  RN administered medications per provider orders and assessed for needs.  Pt is in no acute distress at this time. Pt remains safe with q 15 min checks on the unit.

## 2020-12-02 NOTE — Progress Notes (Signed)
Recreation Therapy Notes  Animal-Assisted Activity (AAA) Program Checklist/Progress Notes Patient Eligibility Criteria Checklist & Daily Group note for Rec Tx Intervention  Date: 12/02/2020 Time: 3:00pm Location: [address]   AAA/T Program Assumption of Risk Form signed by Patient/ or Parent Legal Guardian YES  Patient is free of allergies or severe asthma YES  Patient reports no fear of animals YES  Patient reports no history of cruelty to animals YES  Patient understands their participation is voluntary YES  Patient washes hands before animal contact YES  Patient washes hands after animal contact YES  Behavioral Response: Active  Education: Charity fundraiser, Appropriate Animal Interaction   Education Outcome: Acknowledges understanding  Clinical Observations/Feedback: Pt attended group session and interacted appropriately with peers, staff, Teaching laboratory technician and therapy dog, Bodi. Pt shared with staff that they would like to have a dachshund or a chihuahua for a pet in the future and enjoys being around animals.    Suzanne Garcia, LRT/CTRS Suzanne Garcia 12/02/2020, 4:16 PM

## 2020-12-03 NOTE — Progress Notes (Signed)
The coping skill that the patient would like to use following discharge is to ride a bike. She states that she spoke to a lot of people today. Her goal for tomorrow is to "love myself more".

## 2020-12-03 NOTE — Tx Team (Signed)
Interdisciplinary Treatment and Diagnostic Plan Update  12/03/2020 Time of Session: 9:15am Suzanne Garcia MRN: [id number]  Principal Diagnosis: MDD (major depressive disorder), recurrent severe, without psychosis ([place])  Secondary Diagnoses: Principal Problem:   MDD (major depressive disorder), recurrent severe, without psychosis ([place]) Active Problems:   ADHD   Suicidal ideation   Current Medications:  Current Facility-Administered Medications  Medication Dose Route Frequency Provider Last Rate Last Admin  . acetaminophen (TYLENOL) tablet 650 mg  650 mg Oral Q6H PRN Sharma Covert, MD      . alum & mag hydroxide-simeth (MAALOX/MYLANTA) 200-200-20 MG/5ML suspension 30 mL  30 mL Oral Q4H PRN Sharma Covert, MD      . feeding supplement (ENSURE ENLIVE / ENSURE PLUS) liquid 237 mL  237 mL Oral BID BM Sharma Covert, MD   237 mL at 12/03/20 0914  . hydrOXYzine (ATARAX/VISTARIL) tablet 25 mg  25 mg Oral TID PRN Sharma Covert, MD      . magnesium hydroxide (MILK OF MAGNESIA) suspension 30 mL  30 mL Oral Daily PRN Sharma Covert, MD      . mirtazapine (REMERON SOL-TAB) disintegrating tablet 15 mg  15 mg Oral QHS Emmaline Kluver, FNP   15 mg at 12/02/20 2136  . traZODone (DESYREL) tablet 50 mg  50 mg Oral QHS PRN Sharma Covert, MD       PTA Medications: No medications prior to admission.    Patient Stressors: Marital or family conflict Medication change or noncompliance  Patient Strengths: Capable of independent living Curator fund of knowledge Motivation for treatment/growth Supportive family/friends  Treatment Modalities: Medication Management, Group therapy, Case management,  1 to 1 session with clinician, Psychoeducation, Recreational therapy.   Physician Treatment Plan for Primary Diagnosis: MDD (major depressive disorder), recurrent severe, without psychosis ([place]) Long Term Goal(s):     Short Term Goals:    Medication  Management: Evaluate patient's response, side effects, and tolerance of medication regimen.  Therapeutic Interventions: 1 to 1 sessions, Unit Group sessions and Medication administration.  Evaluation of Outcomes: Not Met  Physician Treatment Plan for Secondary Diagnosis: Principal Problem:   MDD (major depressive disorder), recurrent severe, without psychosis ([place]) Active Problems:   ADHD   Suicidal ideation  Long Term Goal(s):     Short Term Goals:       Medication Management: Evaluate patient's response, side effects, and tolerance of medication regimen.  Therapeutic Interventions: 1 to 1 sessions, Unit Group sessions and Medication administration.  Evaluation of Outcomes: Not Met   RN Treatment Plan for Primary Diagnosis: MDD (major depressive disorder), recurrent severe, without psychosis ([place]) Long Term Goal(s): Knowledge of disease and therapeutic regimen to maintain health will improve  Short Term Goals: Ability to participate in decision making will improve, Ability to verbalize feelings will improve and Ability to disclose and discuss suicidal ideas  Medication Management: RN will administer medications as ordered by provider, will assess and evaluate patient's response and provide education to patient for prescribed medication. RN will report any adverse and/or side effects to prescribing provider.  Therapeutic Interventions: 1 on 1 counseling sessions, Psychoeducation, Medication administration, Evaluate responses to treatment, Monitor vital signs and CBGs as ordered, Perform/monitor CIWA, COWS, AIMS and Fall Risk screenings as ordered, Perform wound care treatments as ordered.  Evaluation of Outcomes: Not Met   LCSW Treatment Plan for Primary Diagnosis: MDD (major depressive disorder), recurrent severe, without psychosis ([place]) Long Term Goal(s): Safe transition to appropriate next level  of care at discharge, Engage patient in therapeutic group addressing interpersonal  concerns.  Short Term Goals: Engage patient in aftercare planning with referrals and resources, Increase social support and Increase ability to appropriately verbalize feelings  Therapeutic Interventions: Assess for all discharge needs, 1 to 1 time with Social worker, Explore available resources and support systems, Assess for adequacy in community support network, Educate family and significant other(s) on suicide prevention, Complete Psychosocial Assessment, Interpersonal group therapy.  Evaluation of Outcomes: Not Met   Progress in Treatment: Attending groups: Yes. Participating in groups: Yes. Taking medication as prescribed: Yes. Toleration medication: Yes. Family/Significant other contact made: No, will contact:  mother Patient understands diagnosis: Yes. Discussing patient identified problems/goals with staff: Yes. Medical problems stabilized or resolved: Yes. Denies suicidal/homicidal ideation: Yes. Issues/concerns per patient self-inventory: Yes. Other: None  New problem(s) identified: No, Describe:  CSW will continue to assess  New Short Term/Long Term Goal(s):medication stabilization, elimination of SI thoughts, development of comprehensive mental wellness plan.  Patient Goals:  "figure out what I am going to do when I leave."  Discharge Plan or Barriers: Patient recently admitted. CSW will continue to follow and assess for appropriate referrals and possible discharge planning.  Reason for Continuation of Hospitalization: Depression Medication stabilization Suicidal ideation  Estimated Length of Stay: 3-5 days  Attendees: Patient: Suzanne Garcia 12/03/2020   Physician: Myles Lipps, MD 12/03/2020   Nursing:  12/03/2020   RN Care Manager: 12/03/2020   Social Worker: Toney Reil, LCSWA 12/03/2020   Recreational Therapist:  12/03/2020   Other:  12/03/2020   Other:  12/03/2020   Other: 12/03/2020       Scribe for Treatment Team: Mliss Fritz, Latanya Presser 12/03/2020 3:23 PM

## 2020-12-03 NOTE — Progress Notes (Signed)
D: Patient is guarded and minimal during assessment but is cooperative. Patient denies SI/HI at this time. Patient also denies AH/VH at this time. Patient contracts for safety.  A: Provided positive reinforcement and encouragement.  R: Patient cooperative and receptive to efforts. Patient remains safe on the unit.   12/02/20 2136  Psychosocial Assessment  Patient Complaints Depression;Sadness  Eye Contact Fair  Facial Expression Flat  Affect Appropriate to circumstance  Speech Logical/coherent  Interaction Assertive  Motor Activity Fidgety  Appearance/Hygiene Unremarkable  Behavior Characteristics Cooperative;Appropriate to situation  Mood Depressed;Sad  Thought Process  Coherency WDL  Content WDL  Delusions None reported or observed  Perception WDL  Hallucination None reported or observed  Judgment Poor  Confusion None  Danger to Self  Current suicidal ideation? Denies  Danger to Others  Danger to Others None reported or observed
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
## 2020-12-03 NOTE — Progress Notes (Signed)
Pt presents with constricted affect and calm mood.  Pt denies SI/HI/AVH.  Pt is spending time out of her room and attending groups.  Pt took medications without incident; no adverse reactions were noted.    RN provided support and assessed for needs/concerns.  Pt is in no acute distress at this time.  Pt remains safe with q 15 min checks in place.

## 2020-12-03 NOTE — Progress Notes (Signed)
Recreation Therapy Notes  Date: 12/03/2020 Time: 930a Location: [address] Dayroom  Group Topic: Stress Management  Goal Area(s) Addresses:  Patient will identify benefits of using stress management post d/c.  Behavioral Response: Receptive  Intervention: Relaxation, Therapeutic Coloring  Activity: Mandalas and Music.  Education: Relaxation, Mindfulness   Education Outcome: Brief overview provided, pt verbalized understanding.  Clinical Observations/Feedback: Pt joined peers in dayroom as group session was wrapping up. LRT reviewed stress management technique. When offered, pt selected a traditional mandala coloring page to complete independently as a calming exercise.    Suzanne Garcia Merik Mignano, LRT/CTRS Benito Mccreedy Sisto Granillo 12/03/2020, 1:36 PM

## 2020-12-03 NOTE — Progress Notes (Addendum)
[facility]  12/03/2020 10:29 AM Suzanne Garcia  MRN:  [id number] Subjective:  Patient states that she was admitted because "something happened in my relationship, my boyfriend was cheating on me and I felt suicidal." Patient reports she had suicidal ideation prior to admission with thoughts of taking an overdose of medications belonging to a family member but did not take any steps to act on this thought. Patient denies current suicidal ideation, intent, preparation or plan. She last had these thoughts on the night of admission. Patient denies thoughts of harming others, hallucinations or paranoia.  The patient denies any problems with or side effects to her medication.  She states she is eating and sleeping okay.  She reports that her mood is "better, less depressed" than on admission. She denies anhedonia. She denies any urges to engage in self-mutilating activity.  Principal Problem: MDD (major depressive disorder), recurrent severe, without psychosis (HCC) Diagnosis: Principal Problem:   MDD (major depressive disorder), recurrent severe, without psychosis (HCC) Active Problems:   ADHD   Suicidal ideation  Total Time spent with patient: 20 minutes  Past Psychiatric History: Patient reports prior psychiatric treatment for depression and anxiety.  She reports history of past suicide attempts in 2016 and 2018 via overdose.  She reports h/o self-mutilation by cutting in the past which she would do to feel control over pain thereby improving mood.  Patient reports 2 prior admissions in 2016 and 2018.  Past Medical History:  Past Medical History:  Diagnosis Date  . Anxiety   . Bipolar disorder (HCC)   . Depression     Past Surgical History:  Procedure Laterality Date  . NO PAST SURGERIES     Family History:  Family History  Problem Relation Age of Onset  . Obesity Mother   . Asthma Mother   . Miscarriages / [place]birth[place] Mother   . Diabetes Mother   . Depression Mother   .  Mental illness Father   . ADD / ADHD Sister   . Bipolar disorder Sister   . Heart disease Maternal Grandmother   . Diabetes Maternal Grandmother   . Bipolar disorder Maternal Grandmother   . Bipolar disorder Maternal Grandfather   . Multiple sclerosis Maternal Grandfather    Family Psychiatric  History: See above Social History:  Social History   Substance and Sexual Activity  Alcohol Use Yes   Comment: last drink was in November, occasionally drinker (3 shots)     Social History   Substance and Sexual Activity  Drug Use Yes  . Types: Marijuana   Comment: last use of "weed" in Dec. (prior use was once every other week)    Social History   Socioeconomic History  . Marital status: Single    Spouse name: Not on file  . Number of children: Not on file  . Years of education: Not on file  . Highest education level: Not on file  Occupational History  . Not on file  Tobacco Use  . Smoking status: Never Smoker  . Smokeless tobacco: Never Used  Vaping Use  . Vaping Use: Never used  Substance and Sexual Activity  . Alcohol use: Yes    Comment: last drink was in November, occasionally drinker (3 shots)  . Drug use: Yes    Types: Marijuana    Comment: last use of "weed" in Dec. (prior use was once every other week)  . Sexual activity: Yes    Partners: Male    Birth control/protection: None  Other Topics Concern  . Not on file  Social History Narrative   Born at [facility] in [place]. Jaundice in NBN.      Social Determinants of Health   Financial Resource Strain: Not on file  Food Insecurity: No Food Insecurity  . Worried About Programme researcher, broadcasting/film/video in the Last Year: Never true  . Ran Out of Food in the Last Year: Never true  Transportation Needs: No Transportation Needs  . Lack of Transportation (Medical): No  . Lack of Transportation (Non-Medical): No  Physical Activity: Not on file  Stress: Not on file  Social Connections: Not on file   Additional Social History:                          Sleep: Good  Appetite:  Fair  Current Medications: Current Facility-Administered Medications  Medication Dose Route Frequency Provider Last Rate Last Admin  . acetaminophen (TYLENOL) tablet 650 mg  650 mg Oral Q6H PRN Antonieta Pert, MD      . alum & mag hydroxide-simeth (MAALOX/MYLANTA) 200-200-20 MG/5ML suspension 30 mL  30 mL Oral Q4H PRN Antonieta Pert, MD      . feeding supplement (ENSURE ENLIVE / ENSURE PLUS) liquid 237 mL  237 mL Oral BID BM Antonieta Pert, MD   237 mL at 12/03/20 0914  . hydrOXYzine (ATARAX/VISTARIL) tablet 25 mg  25 mg Oral TID PRN Antonieta Pert, MD      . magnesium hydroxide (MILK OF MAGNESIA) suspension 30 mL  30 mL Oral Daily PRN Antonieta Pert, MD      . mirtazapine (REMERON SOL-TAB) disintegrating tablet 15 mg  15 mg Oral QHS Patrcia Dolly, FNP   15 mg at 12/02/20 2136  . traZODone (DESYREL) tablet 50 mg  50 mg Oral QHS PRN Antonieta Pert, MD        Lab Results:  Results for orders placed or performed during the hospital encounter of 12/01/20 (from the past 48 hour(s))  Pregnancy, urine     Status: None   Collection Time: 12/01/20 11:20 PM  Result Value Ref Range   Preg Test, Ur NEGATIVE NEGATIVE    Comment:        THE SENSITIVITY OF THIS METHODOLOGY IS >20 mIU/mL. Performed at [facility], [address], [place], [place] [address]   Urinalysis, Routine w reflex microscopic Urine, Clean Catch     Status: Abnormal   Collection Time: 12/01/20 11:20 PM  Result Value Ref Range   Color, Urine YELLOW YELLOW   APPearance HAZY (A) CLEAR   Specific Gravity, Urine 1.029 1.005 - 1.030   pH 5.0 5.0 - 8.0   Glucose, UA NEGATIVE NEGATIVE mg/dL   Hgb urine dipstick NEGATIVE NEGATIVE   Bilirubin Urine NEGATIVE NEGATIVE   Ketones, ur 20 (A) NEGATIVE mg/dL   Protein, ur NEGATIVE NEGATIVE mg/dL   Nitrite NEGATIVE NEGATIVE   Leukocytes,Ua NEGATIVE NEGATIVE    Comment: Performed at [facility], [address], [place], [place] [address]  Urine rapid drug screen (hosp performed)not at [facility]     Status: None   Collection Time: 12/01/20 11:20 PM  Result Value Ref Range   Opiates NONE DETECTED NONE DETECTED   Cocaine NONE DETECTED NONE DETECTED   Benzodiazepines NONE DETECTED NONE DETECTED   Amphetamines NONE DETECTED NONE DETECTED   Tetrahydrocannabinol NONE DETECTED NONE DETECTED   Barbiturates NONE DETECTED NONE DETECTED  Comment: (NOTE) DRUG SCREEN FOR MEDICAL PURPOSES ONLY.  IF CONFIRMATION IS NEEDED FOR ANY PURPOSE, NOTIFY LAB WITHIN 5 DAYS.  LOWEST DETECTABLE LIMITS FOR URINE DRUG SCREEN Drug Class                     Cutoff (ng/mL) Amphetamine and metabolites    1000 Barbiturate and metabolites    200 Benzodiazepine                 200 Tricyclics and metabolites     300 Opiates and metabolites        300 Cocaine and metabolites        300 THC                            50 Performed at [facility], [address] [place], [place] [address]   CBC     Status: None   Collection Time: 12/02/20  6:24 AM  Result Value Ref Range   WBC 5.2 4.0 - 10.5 K/uL   RBC 4.29 3.87 - 5.11 MIL/uL   Hemoglobin 12.9 12.0 - 15.0 g/dL   HCT 46.5 68.1 - 27.5 %   MCV 89.0 80.0 - 100.0 fL   MCH 30.1 26.0 - 34.0 pg   MCHC 33.8 30.0 - 36.0 g/dL   RDW 17.0 01.7 - 49.4 %   Platelets 287 150 - 400 K/uL   nRBC 0.0 0.0 - 0.2 %    Comment: Performed at [facility], [address], [place], [place] [address]  Comprehensive metabolic panel     Status: Abnormal   Collection Time: 12/02/20  6:24 AM  Result Value Ref Range   Sodium 140 135 - 145 mmol/L   Potassium 3.8 3.5 - 5.1 mmol/L   Chloride 106 98 - 111 mmol/L   CO2 23 22 - 32 mmol/L   Glucose, Bld 72 70 - 99 mg/dL    Comment: Glucose reference range applies only to samples taken after fasting for at least 8 hours.   BUN 16 6 - 20 mg/dL   Creatinine, Ser 9.16 0.44 -  1.00 mg/dL   Calcium 9.0 8.9 - 38.4 mg/dL   Total Protein 7.1 6.5 - 8.1 g/dL   Albumin 4.2 3.5 - 5.0 g/dL   AST 17 15 - 41 U/L   ALT 11 0 - 44 U/L   Alkaline Phosphatase 37 (L) 38 - 126 U/L   Total Bilirubin 0.9 0.3 - 1.2 mg/dL   GFR, Estimated >66 >59 mL/min    Comment: (NOTE) Calculated using the CKD-EPI Creatinine Equation (2021)    Anion gap 11 5 - 15    Comment: Performed at [facility], [address] [place], [place] [address]  Hemoglobin A1c     Status: None   Collection Time: 12/02/20  6:24 AM  Result Value Ref Range   Hgb A1c MFr Bld 5.0 4.8 - 5.6 %    Comment: (NOTE) Pre diabetes:          5.7%-6.4%  Diabetes:              >6.4%  Glycemic control for   <7.0% adults with diabetes    Mean Plasma Glucose 96.8 mg/dL    Comment: Performed at [facility], [address] [place], [place] [address]  Ethanol     Status: None   Collection Time: 12/02/20  6:24 AM  Result Value Ref Range   Alcohol, Ethyl (B) <10 <10 mg/dL    Comment: (NOTE) Lowest detectable limit for serum alcohol is 10 mg/dL.  For medical purposes only. Performed at [facility], [address], [place], [place] [address]   Lipid panel     Status: None   Collection Time: 12/02/20  6:24 AM  Result Value Ref Range   Cholesterol 158 0 - 200 mg/dL   Triglycerides 28 <528 mg/dL   HDL 58 >41 mg/dL   Total CHOL/HDL Ratio 2.7 RATIO   VLDL 6 0 - 40 mg/dL   LDL Cholesterol 94 0 - 99 mg/dL    Comment:        Total Cholesterol/HDL:CHD Risk Coronary Heart Disease Risk Table                     Men   Women  1/2 Average Risk   3.4   3.3  Average Risk       5.0   4.4  2 X Average Risk   9.6   7.1  3 X Average Risk  23.4   11.0        Use the calculated Patient Ratio above and the CHD Risk Table to determine the patient's CHD Risk.        ATP III CLASSIFICATION (LDL):  <100     mg/dL   Optimal  324-401  mg/dL   Near or Above                    Optimal   130-159  mg/dL   Borderline  027-253  mg/dL   High  >664     mg/dL   Very High Performed at [facility], [address], [place], [place] [address]   TSH     Status: None   Collection Time: 12/02/20  6:24 AM  Result Value Ref Range   TSH 1.614 0.350 - 4.500 uIU/mL    Comment: Performed by a 3rd Generation assay with a functional sensitivity of <=0.01 uIU/mL. Performed at [facility], [address], [place], [place] [address]     Blood Alcohol level:  Lab Results  Component Value Date   [facility] <10 12/02/2020   ETH <5 04/29/2015    Metabolic Disorder Labs: Lab Results  Component Value Date   HGBA1C 5.0 12/02/2020   MPG 96.8 12/02/2020   MPG 99.67 05/27/2018   Lab Results  Component Value Date   PROLACTIN 40.7 (H) 05/30/2018   PROLACTIN 49.0 (H) 05/27/2018   Lab Results  Component Value Date   CHOL 158 12/02/2020   TRIG 28 12/02/2020   HDL 58 12/02/2020   CHOLHDL 2.7 12/02/2020   VLDL 6 12/02/2020   LDLCALC 94 12/02/2020   LDLCALC 70 05/27/2018    Physical Findings: AIMS: Facial and Oral Movements Muscles of Facial Expression: None, normal Lips and Perioral Area: None, normal Jaw: None, normal Tongue: None, normal,Extremity Movements Upper (arms, wrists, hands, fingers): None, normal Lower (legs, knees, ankles, toes): None, normal, Trunk Movements Neck, shoulders, hips: None, normal, Overall Severity Severity of abnormal movements (highest score from questions above): None, normal Incapacitation due to abnormal movements: None, normal Patient's awareness of abnormal movements (rate only patient's report): No Awareness, Dental Status Current problems with teeth and/or dentures?: No Does patient usually wear dentures?: No  CIWA:  CIWA-Ar Total: 2 COWS:  COWS Total Score: 1  Musculoskeletal: Strength & Muscle Tone: within  normal limits Gait & Station: normal Patient leans: N/A  Psychiatric Specialty Exam: Physical  Exam Vitals and nursing note reviewed.  Constitutional:      Appearance: Normal appearance.  HENT:     Head: Normocephalic and atraumatic.  Pulmonary:     Effort: Pulmonary effort is normal.  Neurological:     General: No focal deficit present.     Mental Status: She is alert and oriented to person, place, and time.  Psychiatric:        Behavior: Behavior normal.        Thought Content: Thought content normal.        Judgment: Judgment normal.     Review of Systems  Constitutional: Negative.   HENT: Negative.   Respiratory: Negative.   Cardiovascular: Negative.   Gastrointestinal: Negative.   Neurological: Negative.   Psychiatric/Behavioral: Negative for hallucinations, self-injury, sleep disturbance and suicidal ideas.    Blood pressure 111/73, pulse 89, temperature 97.8 F (36.6 C), temperature source Oral, resp. rate 18, height 4\' 10"  (1.473 m), weight 51.3 kg, SpO2 100 %.Body mass index is 23.62 kg/m.  General Appearance: Casual and Neat  Eye Contact:  Fair  Speech:  Clear and Coherent and Normal Rate  Volume:  Normal  Mood:  better, less depressed  Affect:  Constricted  Thought Process:  Coherent, Goal Directed and Linear  Orientation:  Full (Time, Place, and Person)  Thought Content:  Logical and Abstract Reasoning  Suicidal Thoughts:  No  Homicidal Thoughts:  No  Memory:   Recent and Remote; Good  Judgement:  Fair  Insight:  Fair  Psychomotor Activity:  Normal  Concentration:  Concentration: Fair  Recall:  Fair  Fund of Knowledge:  Good  Language:  Good  Akathisia:  No  Handed:  Right  AIMS (if indicated):     Assets:   Communication Skills Desire for Improvement Financial Resources/Insurance Housing Intimacy Leisure Time Physical Health Resilience Social Support Talents/Skills Transportation  ADL's:  Intact  Cognition:  WNL  Sleep:  Number of Hours: 6.75     Treatment Plan Summary: Daily contact with patient to assess and evaluate symptoms  and progress in treatment  Continue every 15 minute observation for safety  Medication management: Depression -  Continue mirtazapine 15mg  qhs for depression Insomnia -  Continue trazodone 50mg  qhs for insomonia  Anxiety -  Continue hydroxyzine 25mg  TID PRN anxiety  As needed medications: -tylenol 650mg  Q6 PRN pain -maalox 6mL Q4 PRN indigestion -milk of magnesia 69mL daily PRN/constipation  Follow up on results of lab work, including STI lab results. CBC, CMP, Lipids, Hgb A1c, TSH reviewed and within normal limits  Patient will participate in CBT, group therapy, recreation therapy and milieu therapy on the unit.   Connect with outpatient therapy and medication management.    Social work to work on discharge planning and arranging appropriate aftercare.  I certify that inpatient services furnished can reasonably be expected to improve the patient's condition.   , MD 12/03/2020, 10:29 AM

## 2020-12-03 NOTE — [facility] Group Notes (Signed)
LCSW Group Therapy Note  Type of Therapy/Topic: Group Therapy: Six Dimensions of Wellness  Participation Level: Active  Description of Group:  This group will address the concept of wellness and the six concepts of wellness: occupational, physical, social, intellectual, spiritual, and emotional. Patients will be encouraged to process areas in their lives that are out of balance and identify reasons for remaining unbalanced. Patients will be encouraged to explore ways to practice healthy habits daily to attain better physical and mental health outcomes.  Therapeutic Goals:  1. Identify aspects of wellness that they are doing well.  2. Identify aspects of wellness that they would like to improve upon.  3. Identify one action they can take to improve an aspect of wellness in their lives.  Summary of Patient Progress:  Suzanne Garcia spent time discussing wellness with her peers during a recreational activity.

## 2020-12-04 DIAGNOSIS — F332 Major depressive disorder, recurrent severe without psychotic features: Secondary | ICD-10-CM | POA: Diagnosis not present
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
## 2020-12-04 NOTE — Progress Notes (Signed)
D: Patient presents with sad affect but is pleasant at time of assessment. Patient can be minimal and guarded at time but was cooperative with assessment. Patient denies SI/HI at this time. Patient also denies AH/VH at this time. Patient contracts for safety.  A: Provided positive reinforcement and encouragement.  R: Patient cooperative and receptive to efforts. Patient remains safe on the unit.   12/03/20 2124  Psych Admission Type (Psych Patients Only)  Admission Status Voluntary  Psychosocial Assessment  Patient Complaints Sadness;Depression  Eye Contact Fair  Facial Expression Flat  Affect Appropriate to circumstance  Speech Logical/coherent  Interaction Assertive  Motor Activity Other (Comment) (WDL)  Appearance/Hygiene Unremarkable  Behavior Characteristics Cooperative;Appropriate to situation  Mood Depressed;Sad  Thought Process  Coherency WDL  Content WDL  Delusions None reported or observed  Perception WDL  Hallucination None reported or observed  Judgment Poor  Confusion None  Danger to Self  Current suicidal ideation? Denies  Danger to Others  Danger to Others None reported or observed

## 2020-12-04 NOTE — Progress Notes (Signed)
Adult Psychoeducational Group Note  Date:  12/04/2020 Time:  9:39 PM  Group Topic/Focus:  Wrap-Up Group:   The focus of this group is to help patients review their daily goal of treatment and discuss progress on daily workbooks.  Participation Level:  Active  Participation Quality:  Appropriate  Affect:  Appropriate  Cognitive:  Appropriate  Insight: Appropriate  Engagement in Group:  Engaged  Modes of Intervention:  Discussion  Additional Comments:  Patient said she would rate her day 10. Her goal was to leave tomorrow and write her discharge plan. Yes and no if she meet her goal. Her coping skills were writing. She concern want to know if she will go home tomorrow.  Suzanne Garcia 12/04/2020, 9:39 PM

## 2020-12-04 NOTE — Progress Notes (Signed)
   12/04/20 2316  Psych Admission Type (Psych Patients Only)  Admission Status Voluntary  Psychosocial Assessment  Patient Complaints None  Eye Contact Avertive  Facial Expression Flat  Affect Flat  Speech Logical/coherent  Interaction Assertive  Motor Activity Other (Comment) (wnl)  Appearance/Hygiene Unremarkable  Behavior Characteristics Cooperative;Appropriate to situation  Mood Sad  Thought Process  Coherency WDL  Content WDL  Delusions None reported or observed  Perception WDL  Hallucination None reported or observed  Judgment UTA  Confusion None  Danger to Self  Current suicidal ideation? Denies  Danger to Others  Danger to Others None reported or observed   Pt denies SI, HI, AVH and pain. Flat affect.

## 2020-12-04 NOTE — Progress Notes (Signed)
   12/04/20 0608  Vital Signs  Temp 97.9 F (36.6 C)  Temp Source Oral  Pulse Rate (!) 55  BP 101/79  BP Location Right Arm  BP Method Automatic  Patient Position (if appropriate) Sitting  Oxygen Therapy  SpO2 95 %   D: Patient denies SI/HI/AVH. Patient denies anxiety and depression. Patient out in open areas and was social with peers and staff.   A:  Patient took scheduled medicine.  Support and encouragement provided Routine safety checks conducted every 15 minutes. Patient  Informed to notify staff with any concerns.    R: Safety maintained.
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
## 2020-12-04 NOTE — Progress Notes (Signed)
[facility] MD Progress Note  12/04/2020 10:49 AM Suzanne Garcia  MRN:  [id number]  Subjective: Suzanne Garcia reports, "My mood is good, I just feel better today".  Objective: Suzanne Garcia is an 20 y.o. female with history of depression and anxiety. She is presenting for suicidal thoughts to overdose on pills. She reports SI started two months ago and does not feel she can keep herself safe at home. She identifies relationship issues as trigger but declines to elaborate. Denies HI/AVH. Daily notes: Suzanne Garcia is seen, chart review. The chart findings discussed with the treatment team. She presents alert, oriented & aware of situation. She is visible on the unit, attending group sessions. She reports today that her mood is good, that she just feels better. However, she presents with a flat affect with minimal eye contact. She also says she is tired, but denies any specific issues or concerns. She says she slept well last night. She reports that after discharge, she will be going to the home of her mother. Suzanne Garcia was asked if she would consent to getting checked for STDs since she reported that her boyfriend was cheating on her? She replied, he already has given me STD (herpes). [facility]. She declines to be treated at this time. Says will wait to make the decision whether she wants treatment or not later. She currently denies any SIHI, AVH, delusional thoughts or paranoia. She does not appear to be responding to any internal stimuli. Suzanne Garcia is in agreement to continue her current plan of care as already in progress. No changes made on her current plan of care.  Principal Problem: MDD (major depressive disorder), recurrent severe, without psychosis (HCC)  Diagnosis: Principal Problem:   MDD (major depressive disorder), recurrent severe, without psychosis (HCC) Active Problems:   ADHD   Suicidal ideation  Total Time spent with patient: 25 minutes  Past  Psychiatric History: See H&P  Past Medical History:  Past Medical History:  Diagnosis Date  . Anxiety   . Bipolar disorder (HCC)   . Depression     Past Surgical History:  Procedure Laterality Date  . NO PAST SURGERIES     Family History:  Family History  Problem Relation Age of Onset  . Obesity Mother   . Asthma Mother   . Miscarriages / [place] Mother   . Diabetes Mother   . Depression Mother   . Mental illness Father   . ADD / ADHD Sister   . Bipolar disorder Sister   . Heart disease Maternal Grandmother   . Diabetes Maternal Grandmother   . Bipolar disorder Maternal Grandmother   . Bipolar disorder Maternal Grandfather   . Multiple sclerosis Maternal Grandfather    Family Psychiatric  History: See H&P  Social History:  Social History   Substance and Sexual Activity  Alcohol Use Yes   Comment: last drink was in November, occasionally drinker (3 shots)     Social History   Substance and Sexual Activity  Drug Use Yes  . Types: Marijuana   Comment: last use of "weed" in Dec. (prior use was once every other week)    Social History   Socioeconomic History  . Marital status: Single    Spouse name: Not on file  . Number of children: Not on file  . Years of education: Not on file  . Highest education level: Not on file  Occupational History  . Not on file  Tobacco Use  .  Smoking status: Never Smoker  . Smokeless tobacco: Never Used  Vaping Use  . Vaping Use: Never used  Substance and Sexual Activity  . Alcohol use: Yes    Comment: last drink was in November, occasionally drinker (3 shots)  . Drug use: Yes    Types: Marijuana    Comment: last use of "weed" in Dec. (prior use was once every other week)  . Sexual activity: Yes    Partners: Male    Birth control/protection: None  Other Topics Concern  . Not on file  Social History Narrative   Born at [facility] in [place]. Jaundice in NBN.      Social Determinants of Health   Financial Resource  Strain: Not on file  Food Insecurity: No Food Insecurity  . Worried About Programme researcher, broadcasting/film/video in the Last Year: Never true  . Ran Out of Food in the Last Year: Never true  Transportation Needs: No Transportation Needs  . Lack of Transportation (Medical): No  . Lack of Transportation (Non-Medical): No  Physical Activity: Not on file  Stress: Not on file  Social Connections: Not on file   Additional Social History:   Sleep: Good  Appetite:  Good  Current Medications: Current Facility-Administered Medications  Medication Dose Route Frequency Provider Last Rate Last Admin  . acetaminophen (TYLENOL) tablet 650 mg  650 mg Oral Q6H PRN Antonieta Pert, MD      . alum & mag hydroxide-simeth (MAALOX/MYLANTA) 200-200-20 MG/5ML suspension 30 mL  30 mL Oral Q4H PRN Antonieta Pert, MD      . feeding supplement (ENSURE ENLIVE / ENSURE PLUS) liquid 237 mL  237 mL Oral BID BM Antonieta Pert, MD   237 mL at 12/04/20 0912  . hydrOXYzine (ATARAX/VISTARIL) tablet 25 mg  25 mg Oral TID PRN Antonieta Pert, MD      . magnesium hydroxide (MILK OF MAGNESIA) suspension 30 mL  30 mL Oral Daily PRN Antonieta Pert, MD      . mirtazapine (REMERON SOL-TAB) disintegrating tablet 15 mg  15 mg Oral QHS Patrcia Dolly, FNP   15 mg at 12/03/20 2124  . traZODone (DESYREL) tablet 50 mg  50 mg Oral QHS PRN Antonieta Pert, MD        Lab Results: No results found for this or any previous visit (from the past 48 hour(s)).  Blood Alcohol level:  Lab Results  Component Value Date   ETH <10 12/02/2020   ETH <5 04/29/2015    Metabolic Disorder Labs: Lab Results  Component Value Date   HGBA1C 5.0 12/02/2020   MPG 96.8 12/02/2020   MPG 99.67 05/27/2018   Lab Results  Component Value Date   PROLACTIN 40.7 (H) 05/30/2018   PROLACTIN 49.0 (H) 05/27/2018   Lab Results  Component Value Date   CHOL 158 12/02/2020   TRIG 28 12/02/2020   HDL 58 12/02/2020   CHOLHDL 2.7 12/02/2020   VLDL 6  12/02/2020   LDLCALC 94 12/02/2020   LDLCALC 70 05/27/2018   Physical Findings: AIMS: Facial and Oral Movements Muscles of Facial Expression: None, normal Lips and Perioral Area: None, normal Jaw: None, normal Tongue: None, normal,Extremity Movements Upper (arms, wrists, hands, fingers): None, normal Lower (legs, knees, ankles, toes): None, normal, Trunk Movements Neck, shoulders, hips: None, normal, Overall Severity Severity of abnormal movements (highest score from questions above): None, normal Incapacitation due to abnormal movements: None, normal Patient's awareness of abnormal movements (rate only patient's report):  No Awareness, Dental Status Current problems with teeth and/or dentures?: No Does patient usually wear dentures?: No  CIWA:  CIWA-Ar Total: 2 COWS:  COWS Total Score: 1  Musculoskeletal: Strength & Muscle Tone: within normal limits Gait & Station: normal Patient leans: N/A  Psychiatric Specialty Exam: Physical Exam Vitals and nursing note reviewed.  HENT:     Head: Normocephalic.     Nose: Nose normal.     Mouth/Throat:     Pharynx: Oropharynx is clear.  Eyes:     Pupils: Pupils are equal, round, and reactive to light.  Cardiovascular:     Rate and Rhythm: Normal rate.     Pulses: Normal pulses.  Pulmonary:     Effort: Pulmonary effort is normal.  Genitourinary:    Comments: Deferred Musculoskeletal:        General: Normal range of motion.     Cervical back: Normal range of motion.  Skin:    General: Skin is warm and dry.  Neurological:     General: No focal deficit present.     Mental Status: She is alert and oriented to person, place, and time.     Review of Systems  Constitutional: Negative for chills, diaphoresis and fever.  HENT: Negative for congestion, rhinorrhea, sneezing and sore throat.   Eyes: Negative for discharge.  Respiratory: Negative for cough, shortness of breath and wheezing.   Cardiovascular: Negative for chest pain and  palpitations.  Gastrointestinal: Negative for diarrhea, nausea and vomiting.  Endocrine: Negative for cold intolerance.  Genitourinary: Negative for difficulty urinating.  Musculoskeletal: Negative for arthralgias.  Skin: Negative.   Allergic/Immunologic: Negative for environmental allergies and food allergies.       Allergies: NKDA  Neurological: Negative for dizziness, tremors, seizures, syncope, facial asymmetry, speech difficulty, weakness, light-headedness, numbness and headaches.  Psychiatric/Behavioral: Positive for dysphoric mood ("Improving gradually"). Negative for agitation, behavioral problems, confusion, decreased concentration, hallucinations, self-injury, sleep disturbance and suicidal ideas. The patient is not nervous/anxious and is not hyperactive.     Blood pressure 103/77, pulse 81, temperature 97.9 F (36.6 C), temperature source Oral, resp. rate 18, height 4\' 10"  (1.473 m), weight 51.3 kg, SpO2 95 %.Body mass index is 23.62 kg/m.  General Appearance: Casual and Fairly Groomed  Eye Contact:  Minimal  Speech:  Clear and Coherent and Normal Rate  Volume:  Normal  Mood:  "My mood is good, I just feel better today"., "but I'm feeling  Affect:  Flat  Thought Process:  Coherent and Descriptions of Associations: Intact  Orientation:  Full (Time, Place, and Person)  Thought Content:  Logical, denies any hallucinations, delusions or paranoia.  Suicidal Thoughts:  Denies any thoughts, plans or intent.  Homicidal Thoughts:  Denies  Memory:  Immediate;   Good Recent;   Good Remote;   Good  Judgement:  Fair  Insight:  Fair  Psychomotor Activity:  Decreased  Concentration:  Concentration: Good and Attention Span: Good  Recall:  Good  Fund of Knowledge:  Fair  Language:  Good  Akathisia:  NA  Handed:  Right  AIMS (if indicated):     Assets:  Communication Skills Desire for Improvement Physical Health Resilience Social Support  ADL's:  Intact  Cognition:  WNL  Sleep:   Number of Hours: 6.25   Treatment Plan Summary: Daily contact with patient to assess and evaluate symptoms and progress in treatment and Medication management.  Continue inpatient hospitalization. Will continue today 12/04/2020 plan as below except where it is noted.  Depression.  Continue Mirtazapine Sol -Tab 15 mg po Q bedtime.  Anxiety. Continue Vistaril 25 mg po tid prn.  Insomnia. Continue Trazodone 50 mg po bedtime prn.  Other prn medications. Continue tylenol 650 mg po Q 6 hrs prn for pain/fever. Continue Mylanta 30 ml po Q 4 hrs prn for indigestion. Continue MOM 30 ml po Qd prn  For constipation.  Continue Ensure 237 ml bid between meals. Encourage group sessions. Discharge disposition in progress.  Armandina StammerAgnes Jubal Rademaker, NP, PMHNP, FNP-BC 12/04/2020, 10:49 AM

## 2020-12-05 MED ORDER — TRAZODONE HCL 50 MG PO TABS: 50.0000 mg | ORAL_TABLET | Freq: Every evening | ORAL | 0 refills | Status: DC | PRN

## 2020-12-05 MED ORDER — MIRTAZAPINE 15 MG PO TBDP: 15.0000 mg | ORAL_TABLET | Freq: Every day | ORAL | 0 refills | Status: DC

## 2020-12-05 MED ORDER — HYDROXYZINE HCL 25 MG PO TABS: 25.0000 mg | ORAL_TABLET | Freq: Three times a day (TID) | ORAL | 0 refills | Status: DC | PRN

## 2020-12-05 NOTE — Progress Notes (Signed)
Recreation Therapy Notes  Date: 12/05/2020 Time: 9:30a Location: [address] Dayroom  Group Topic: Stress Management  Goal Area(s) Addresses:  Patient will identify positive stress management techniques. Patient will identify benefits of using stress management post d/c.  Behavioral Response: Attentive, Engaged  Intervention: Worksheet, Group brain storming  Activity :  Mind Map.  Patient was provided a blank template of a diagram with 32 blank boxes in a tiered system, branching from the center (similar to a bubble chart). LRT directed patients to label the middle of the diagram "Stress" and consider 8 different sources of stress in their day to day life. Pt were directed to record their stressors in the 2nd tier boxes closest to the center. Patients were to then come up with 3 effective techniques to address each identified area in the remaining boxes stemming from a particular stressor. Pts were encouraged to share ideas with one another and ask for suggestions of peers and Clinical research associate when stuck on a certain category of stress.  Education:  Stress Management, Discharge Planning.   Education Outcome: Acknowledges Education  Clinical Observations/Feedback: Pt was quiet throughout group session but, observed to actively write as peers offered suggestions for particular stressors. Pt was appropriate and on-task throughout activity.    Nicholos Johns Taeja Debellis, LRT/CTRS Benito Mccreedy Naomia Lenderman 12/05/2020, 1:00 PM

## 2020-12-05 NOTE — [facility] Group Notes (Signed)
Adult Psychoeducational Group Note  Date:  12/05/2020 Time:  10:08 AM  Group Topic/Focus:  Goals Group:   The focus of this group is to help patients establish daily goals to achieve during treatment and discuss how the patient can incorporate goal setting into their daily lives to aide in recovery.  Participation Level:  Active  Participation Quality:  Appropriate  Affect:  Appropriate  Cognitive:  Appropriate  Insight: Appropriate  Engagement in Group:  Engaged  Modes of Intervention:  Discussion  Additional Comments:  Patient attended morning group and participated.  Delicia W [place]ah 12/05/2020, 10:08 AM

## 2020-12-05 NOTE — Progress Notes (Signed)
Patient denies SI/HI. Patient received both written and verbal discharge instructions. Patient verbalizes understanding of discharge instructions. Patient received an AVS, SRA,, transitional record, and prescriptions. Patient received belongings from assigned locker. Patient safely discharged to the lobby and picked up by her boyfriend.
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
## 2020-12-05 NOTE — Progress Notes (Signed)
  [facility] Adult Case Management Discharge Plan :  Will you be returning to the same living situation after discharge:  No. Will be staying with mother At discharge, do you have transportation home?: Yes,  boyfriend to pick this patient up  Do you have the ability to pay for your medications: Yes,  has insurance  Release of information consent forms completed and in the chart;  Patient's signature needed at discharge.  Patient to Follow up at:  Follow-up Information    [facility]. Go on 12/10/2020.   Specialty: Behavioral Health Why: You have a walk in appointment for therapy services on 12/10/20 at 7:45 am.  You also have a walk in appointment for medication management services on 12/29/20 at 7:45 am.  Walk in appointments are first come, first served and are held in person.   Contact information: [address] [place] [address] [phone]       [facility] Follow up.   Why: You may inquire with this provider if you are interested in joining any of their support groups. Contact information: [address] [place], [place] [address] Hours:  Open  Closes 5PM  Phone: [phone]              Next level of care provider has access to [facility] Link:yes  Safety Planning and Suicide Prevention discussed: Yes,  with mother  Have you used any form of tobacco in the last 30 days? (Cigarettes, Smokeless Tobacco, Cigars, and/or Pipes): No  Has patient been referred to the Quitline?: N/A patient is not a smoker  Patient has been referred for addiction treatment: N/A  Otelia Santee, LCSW 12/05/2020, 10:23 AM
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
## 2020-12-10 ENCOUNTER — Emergency Department (HOSPITAL_COMMUNITY)
Admission: EM | Admit: 2020-12-10 | Discharge: 2020-12-10 | Disposition: A | Discharge status: Home / Self Care | Payer: Medicaid Other | Attending: Emergency Medicine | Admitting: Emergency Medicine

## 2020-12-10 ENCOUNTER — Other Ambulatory Visit: Payer: Self-pay

## 2020-12-10 ENCOUNTER — Encounter (HOSPITAL_COMMUNITY): Payer: Self-pay

## 2020-12-10 DIAGNOSIS — Z79899 Other long term (current) drug therapy: Secondary | ICD-10-CM | POA: Insufficient documentation

## 2020-12-10 DIAGNOSIS — R519 Headache, unspecified: Secondary | ICD-10-CM | POA: Insufficient documentation

## 2020-12-10 DIAGNOSIS — Y92410 Unspecified street and highway as the place of occurrence of the external cause: Secondary | ICD-10-CM | POA: Diagnosis not present

## 2020-12-10 DIAGNOSIS — M542 Cervicalgia: Secondary | ICD-10-CM | POA: Diagnosis not present

## 2020-12-10 DIAGNOSIS — M546 Pain in thoracic spine: Secondary | ICD-10-CM | POA: Diagnosis present

## 2020-12-10 MED ORDER — LIDOCAINE 5 % EX PTCH: 1.0000 | MEDICATED_PATCH | CUTANEOUS | 0 refills | Status: DC

## 2020-12-10 MED ORDER — METHOCARBAMOL 500 MG PO TABS: 500.0000 mg | ORAL_TABLET | Freq: Two times a day (BID) | ORAL | 0 refills | Status: DC
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
## 2020-12-10 NOTE — Discharge Instructions (Addendum)
Expect your soreness to increase over the next 2-3 days. Take it easy, but do not lay around too much as this may make any stiffness worse.  Acetaminophen: May take acetaminophen (generic for Tylenol), as needed, for pain. Your daily total maximum amount of acetaminophen from all sources should be limited to 4000mg /day for persons without liver problems, or 2000mg /day for those with liver problems. Methocarbamol: Methocarbamol (generic for Robaxin) is a muscle relaxer and can help relieve stiff muscles or muscle spasms.  Do not drive or perform other dangerous activities while taking this medication as it can cause drowsiness as well as changes in reaction time and judgement. Lidocaine patches: These are available via either prescription or over-the-counter. The over-the-counter option may be more economical one and are likely just as effective. There are multiple over-the-counter brands, such as Salonpas. Ice: May apply ice to the area over the next 24 hours for 15 minutes at a time to reduce pain, inflammation, and swelling, if present. Exercises: Be sure to perform the attached exercises starting with three times a week and working up to performing them daily. This is an essential part of preventing long term problems.  Follow up: Follow up with a primary care provider for any future management of these complaints. Be sure to follow up within 7-10 days. Return: Return to the ED should symptoms worsen.  For prescription assistance, may try using prescription discount sites or apps, such as goodrx.com   Head Injury You have been seen today for a head injury, which may or may not have resulted in a concussion. It does not appear to be serious at this time, however, it is important to note that your presentation today is not necessarily an indication of the severity of future symptoms.  Expected symptoms: Expected symptoms of concussion and/or head injury can include nausea, headache, mild dizziness  (should still be able to get up and walk around without difficulty), difficulty concentrating, increased sleep, difficulty sleeping, increased intensity of emotions. Close observation: The close observation period is usually 6 hours from the injury. This includes staying awake and having a trustworthy adult monitor you to assure your condition does not worsen. You should be in regular contact with this person and ideally, they should be able to monitor you in person.  Secondary observation: The secondary observation period is usually 24 hours from the injury. You are allowed to sleep during this time. A trustworthy adult should intermittently monitor you to assure your condition does not worsen.   Overall head injury/concussion care: Rest: Be sure to get plenty of rest. You will need more rest and sleep while you recover. Hydration: Be sure to stay well hydrated by having a goal of drinking about 0.5 liters of water an hour. Pain:  Acetaminophen: May take acetaminophen (generic for Tylenol), as needed, for pain. Your daily total maximum amount of acetaminophen from all sources should be limited to 4000mg /day for persons without liver problems, or 2000mg /day for those with liver problems. Return to sports and activities: In general, you may return to normal activities once symptoms have subsided, however, you would ideally be cleared by a primary care provider or other qualified medical professional prior to return to these activities.  Follow up: Follow up with the concussion clinic or your primary care provider for further management of this issue. Return: Return to the ED should you begin to have confusion, abnormal behavior, aggression, violence, or personality changes, repeated vomiting, vision loss, numbness or weakness on one side of  the body, difficulty standing due to dizziness, significantly worsening pain, or any other major concerns.

## 2020-12-11 ENCOUNTER — Emergency Department (HOSPITAL_COMMUNITY): Payer: Medicaid Other

## 2020-12-11 ENCOUNTER — Emergency Department (HOSPITAL_COMMUNITY)
Admission: EM | Admit: 2020-12-11 | Discharge: 2020-12-11 | Disposition: A | Discharge status: Home / Self Care | Payer: Medicaid Other | Attending: Emergency Medicine | Admitting: Emergency Medicine

## 2020-12-11 ENCOUNTER — Encounter (HOSPITAL_COMMUNITY): Payer: Self-pay | Admitting: *Deleted

## 2020-12-11 ENCOUNTER — Other Ambulatory Visit: Payer: Self-pay

## 2020-12-11 DIAGNOSIS — S060X0D Concussion without loss of consciousness, subsequent encounter: Secondary | ICD-10-CM

## 2020-12-11 DIAGNOSIS — F0781 Postconcussional syndrome: Secondary | ICD-10-CM | POA: Diagnosis not present

## 2020-12-11 DIAGNOSIS — S0990XA Unspecified injury of head, initial encounter: Secondary | ICD-10-CM | POA: Diagnosis present

## 2020-12-11 DIAGNOSIS — R410 Disorientation, unspecified: Secondary | ICD-10-CM | POA: Diagnosis not present

## 2020-12-11 DIAGNOSIS — Y92410 Unspecified street and highway as the place of occurrence of the external cause: Secondary | ICD-10-CM | POA: Insufficient documentation

## 2020-12-11 DIAGNOSIS — S060X0A Concussion without loss of consciousness, initial encounter: Secondary | ICD-10-CM | POA: Insufficient documentation

## 2020-12-11 DIAGNOSIS — M542 Cervicalgia: Secondary | ICD-10-CM | POA: Diagnosis not present

## 2020-12-11 DIAGNOSIS — R519 Headache, unspecified: Secondary | ICD-10-CM

## 2020-12-11 LAB — HCG, QUANTITATIVE, PREGNANCY: hCG, Beta Chain, Quant, S: <1 m[IU]/mL (ref <5)

## 2020-12-11 MED ORDER — DIPHENHYDRAMINE HCL 50 MG/ML IJ SOLN
25.0000 mg | Freq: Once | INTRAMUSCULAR | Status: AC
  Administered 2020-12-11: 25 mg via INTRAVENOUS
  Filled 2020-12-11: qty 1

## 2020-12-11 MED ORDER — SODIUM CHLORIDE 0.9 % IV BOLUS
1000.0000 mL | Freq: Once | INTRAVENOUS | Status: AC
  Administered 2020-12-11: 1000 mL via INTRAVENOUS

## 2020-12-11 MED ORDER — PROCHLORPERAZINE EDISYLATE 10 MG/2ML IJ SOLN
10.0000 mg | Freq: Once | INTRAMUSCULAR | Status: AC
  Administered 2020-12-11: 10 mg via INTRAVENOUS
  Filled 2020-12-11: qty 2
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
## 2020-12-12 ENCOUNTER — Ambulatory Visit (INDEPENDENT_AMBULATORY_CARE_PROVIDER_SITE_OTHER): Payer: Medicaid Other | Admitting: Obstetrics and Gynecology

## 2020-12-12 ENCOUNTER — Other Ambulatory Visit: Payer: Self-pay

## 2020-12-12 ENCOUNTER — Other Ambulatory Visit (HOSPITAL_COMMUNITY)
Admission: RE | Admit: 2020-12-12 | Discharge: 2020-12-12 | Disposition: A | Discharge status: Home / Self Care | Payer: Medicaid Other | Source: Ambulatory Visit | Attending: Obstetrics and Gynecology | Admitting: Obstetrics and Gynecology

## 2020-12-12 ENCOUNTER — Encounter: Payer: Self-pay | Admitting: Obstetrics and Gynecology

## 2020-12-12 VITALS — BP 105/59 | HR 71 | Ht <= 58 in | Wt 114.6 lb

## 2020-12-12 DIAGNOSIS — Z01419 Encounter for gynecological examination (general) (routine) without abnormal findings: Secondary | ICD-10-CM | POA: Insufficient documentation

## 2020-12-12 MED ORDER — NAPROXEN 250 MG PO TABS: 250.0000 mg | ORAL_TABLET | Freq: Two times a day (BID) | ORAL | 0 refills | Status: DC

## 2020-12-12 NOTE — Progress Notes (Signed)
Pt is in the office for annual exam. Pt would like to discuss painful menstrual cycles, she reports 10/10 pain and it causes her to miss events. Pt reports changing pads 4-5 times a per day.  Pt reports that she is not on [facility] and does not want any.

## 2020-12-12 NOTE — Progress Notes (Signed)
GYNECOLOGY ANNUAL PREVENTATIVE CARE ENCOUNTER NOTE  History:     Suzanne Garcia is a 20 y.o. G50P0010 female here for a routine annual gynecologic exam.  Current complaints: pain with periods Declines BC, does not like the way it makes her feel.   Denies abnormal vaginal bleeding, discharge, pelvic pain, problems with intercourse or other gynecologic concerns.    Gynecologic History Patient's last menstrual period was 11/19/2020 (approximate). Contraception: none Last Pap: NA.  Last mammogram: NA  Obstetric History OB History  Gravida Para Term Preterm AB Living  1 0 0 0 1 0  SAB IAB Ectopic Multiple Live Births  1 0 0 0 0    # Outcome Date GA Lbr Len/2nd Weight Sex Delivery Anes PTL Lv  1 SAB 03/2020 [redacted]w[redacted]d           Past Medical History:  Diagnosis Date  . Anxiety   . Bipolar disorder (HCC)   . Depression     Past Surgical History:  Procedure Laterality Date  . NO PAST SURGERIES      Current Outpatient Medications on File Prior to Visit  Medication Sig Dispense Refill  . hydrOXYzine (ATARAX/VISTARIL) 25 MG tablet Take 1 tablet (25 mg total) by mouth 3 (three) times daily as needed for anxiety. 75 tablet 0  . lidocaine (LIDODERM) 5 % Place 1 patch onto the skin daily. Remove & Discard patch within 12 hours or as directed by MD 30 patch 0  . methocarbamol (ROBAXIN) 500 MG tablet Take 1 tablet (500 mg total) by mouth 2 (two) times daily. 20 tablet 0  . mirtazapine (REMERON SOL-TAB) 15 MG disintegrating tablet Take 1 tablet (15 mg total) by mouth at bedtime. For depression.sleep 30 tablet 0  . traZODone (DESYREL) 50 MG tablet Take 1 tablet (50 mg total) by mouth at bedtime as needed for sleep. 30 tablet 0   No current facility-administered medications on file prior to visit.    Allergies  Allergen Reactions  . Ibuprofen     Patient states that it causes "stomach thinning"     Social History:  reports that she has never smoked. She has never used smokeless  tobacco. She reports current alcohol use. She reports current drug use. Drug: Marijuana.  Family History  Problem Relation Age of Onset  . Obesity Mother   . Asthma Mother   . Miscarriages / [place] Mother   . Diabetes Mother   . Depression Mother   . Mental illness Father   . ADD / ADHD Sister   . Bipolar disorder Sister   . Heart disease Maternal Grandmother   . Diabetes Maternal Grandmother   . Bipolar disorder Maternal Grandmother   . Bipolar disorder Maternal Grandfather   . Multiple sclerosis Maternal Grandfather     The following portions of the patient's history were reviewed and updated as appropriate: allergies, current medications, past family history, past medical history, past social history, past surgical history and problem list.  Review of Systems Pertinent items noted in HPI and remainder of comprehensive ROS otherwise negative.  Physical Exam:  BP (!) 105/59   Pulse 71   Ht 4\' 10"  (1.473 m)   Wt 114 lb 9.6 oz (52 kg)   LMP 11/19/2020 (Approximate)   BMI 23.95 kg/m  CONSTITUTIONAL: Well-developed, well-nourished female in no acute distress.  HENT:  Normocephalic, atraumatic, External right and left ear normal.  EYES: Conjunctivae and EOM are normal. Pupils are equal, round, and reactive to light. No scleral  icterus.  NECK: Normal range of motion, supple, no masses.  Normal thyroid.  SKIN: Skin is warm and dry. No rash noted. Not diaphoretic. No erythema. No pallor. MUSCULOSKELETAL: Normal range of motion. No tenderness.  No cyanosis, clubbing, or edema. NEUROLOGIC: Alert and oriented to person, place, and time. Normal reflexes, muscle tone coordination.  PSYCHIATRIC: Normal mood and affect. Normal behavior. Normal judgment and thought content. CARDIOVASCULAR: Normal heart rate noted, regular rhythm RESPIRATORY: Clear to auscultation bilaterally. Effort and breath sounds normal, no problems with respiration noted. BREASTS: Symmetric in size. No masses,  tenderness, skin changes, nipple drainage, or lymphadenopathy bilaterally. Performed in the presence of a chaperone. ABDOMEN: Soft, no distention noted.  No tenderness, rebound or guarding.  PELVIC: Normal appearing external genitalia and urethral meatus, bimanual exam: no CMT, no uterine tenderness or adnexal tenderness. Pap deferred. GC collected without speculum.  Assessment and Plan:   1. Women's annual routine gynecological examination  - Cervicovaginal ancillary only( [place]) - Rx Naproxen  - Condoms always   Routine preventative health maintenance measures emphasized. Please refer to After Visit Summary for other counseling recommendations.      Rasch, Harolyn Rutherford, NP [facility] [employer], [facility]

## 2020-12-14 LAB — CERVICOVAGINAL ANCILLARY ONLY
Chlamydia: NEGATIVE
Comment: NEGATIVE
Comment: NEGATIVE
Comment: NORMAL
Neisseria Gonorrhea: NEGATIVE
Trichomonas: NEGATIVE

## 2021-01-18 ENCOUNTER — Encounter: Payer: Self-pay | Admitting: Physician Assistant

## 2021-01-18 ENCOUNTER — Telehealth: Payer: Medicaid Other | Admitting: Physician Assistant

## 2021-01-18 DIAGNOSIS — R1030 Lower abdominal pain, unspecified: Secondary | ICD-10-CM | POA: Diagnosis not present

## 2021-01-18 DIAGNOSIS — N946 Dysmenorrhea, unspecified: Secondary | ICD-10-CM

## 2021-01-18 NOTE — Progress Notes (Signed)
Virtual Visit via Video Note  I connected with Suzanne Garcia on 01/18/21 at  5:15 PM EDT by a video enabled telemedicine application and verified that I am speaking with the correct person using two identifiers.  Location: Patient: patient's home  Provider: provider's office    Person participating in the virtual visit: patient and provider  I discussed the limitations of evaluation and management by telemedicine and the availability of in person appointments. The patient expressed understanding and agreed to proceed.      I discussed the assessment and treatment plan with the patient. The patient was provided an opportunity to ask questions and all were answered. The patient agreed with the plan and demonstrated an understanding of the instructions.   The patient was advised to call back or seek an in-person evaluation if the symptoms worsen or if the condition fails to improve as anticipated.  I provided 15 minutes of non-face-to-face time during this encounter.   Demetrio Lapping, PA-C    Acute Office Visit  Subjective:    Patient ID: Suzanne Garcia, female    DOB: 2001/03/22, 20 y.o.   MRN: [id number]  No chief complaint on file.   20 yo F in NAD connects via video visit and requests "stronger medication for period cramps". States LMP was yesterday, and she current has abdominal cramping, typical of her period cramps- on the suprapubic area, severe. States has taken Tyelnol without much help today. States was prescribed RX ibuprofen and Naproxen in the past- and both medication dont help with the pain- needs stronger medicine. Denies any fever, chills, n, v, back pain, dysuria, urgency, frequency, back pain, vaginal discharge.    Patient is in today for "bad period cramps"  Past Medical History:  Diagnosis Date  . Anxiety   . Bipolar disorder (HCC)   . Depression     Past Surgical History:  Procedure Laterality Date  . NO PAST SURGERIES      Family History   Problem Relation Age of Onset  . Obesity Mother   . Asthma Mother   . Miscarriages / [place] Mother   . Diabetes Mother   . Depression Mother   . Mental illness Father   . ADD / ADHD Sister   . Bipolar disorder Sister   . Heart disease Maternal Grandmother   . Diabetes Maternal Grandmother   . Bipolar disorder Maternal Grandmother   . Bipolar disorder Maternal Grandfather   . Multiple sclerosis Maternal Grandfather     Social History   Socioeconomic History  . Marital status: Single    Spouse name: Not on file  . Number of children: Not on file  . Years of education: Not on file  . Highest education level: Not on file  Occupational History  . Not on file  Tobacco Use  . Smoking status: Never Smoker  . Smokeless tobacco: Never Used  Vaping Use  . Vaping Use: Never used  Substance and Sexual Activity  . Alcohol use: Yes    Comment: last drink was in November, occasionally drinker (3 shots)  . Drug use: Yes    Types: Marijuana    Comment: last use of "weed" in Dec. (prior use was once every other week)  . Sexual activity: Yes    Partners: Male    Birth control/protection: None  Other Topics Concern  . Not on file  Social History Narrative   Born at [facility] in [place]. Jaundice in NBN.      Social  Determinants of Health   Financial Resource Strain: Not on file  Food Insecurity: No Food Insecurity  . Worried About Programme researcher, broadcasting/film/video in the Last Year: Never true  . Ran Out of Food in the Last Year: Never true  Transportation Needs: No Transportation Needs  . Lack of Transportation (Medical): No  . Lack of Transportation (Non-Medical): No  Physical Activity: Not on file  Stress: Not on file  Social Connections: Not on file  Intimate Partner Violence: Not on file    Outpatient Medications Prior to Visit  Medication Sig Dispense Refill  . hydrOXYzine (ATARAX/VISTARIL) 25 MG tablet Take 1 tablet (25 mg total) by mouth 3 (three) times daily as needed for  anxiety. 75 tablet 0  . lidocaine (LIDODERM) 5 % Place 1 patch onto the skin daily. Remove & Discard patch within 12 hours or as directed by MD 30 patch 0  . methocarbamol (ROBAXIN) 500 MG tablet Take 1 tablet (500 mg total) by mouth 2 (two) times daily. 20 tablet 0  . mirtazapine (REMERON SOL-TAB) 15 MG disintegrating tablet Take 1 tablet (15 mg total) by mouth at bedtime. For depression.sleep 30 tablet 0  . naproxen (NAPROSYN) 250 MG tablet Take 1 tablet (250 mg total) by mouth 2 (two) times daily with a meal. 30 tablet 0  . traZODone (DESYREL) 50 MG tablet Take 1 tablet (50 mg total) by mouth at bedtime as needed for sleep. 30 tablet 0   No facility-administered medications prior to visit.    Allergies  Allergen Reactions  . Ibuprofen     Patient states that it causes "stomach thinning"     Review of Systems  Constitutional: Negative for chills and fever.  Respiratory: Negative for shortness of breath.   Cardiovascular: Negative for chest pain.  Gastrointestinal: Positive for abdominal pain. Negative for abdominal distention, anal bleeding, constipation, diarrhea, nausea, rectal pain and vomiting.  Genitourinary: Positive for menstrual problem. Negative for decreased urine volume, difficulty urinating, dysuria, flank pain, frequency, pelvic pain, urgency, vaginal bleeding, vaginal discharge and vaginal pain.  Neurological: Negative for dizziness.  Psychiatric/Behavioral: Negative for confusion.       Objective:    Physical Exam Constitutional:      General: She is not in acute distress.    Appearance: Normal appearance. She is not ill-appearing, toxic-appearing or diaphoretic.  HENT:     Head: Normocephalic and atraumatic.  Eyes:     General: No scleral icterus. Pulmonary:     Effort: Pulmonary effort is normal.  Skin:    Coloration: Skin is not pale.  Neurological:     Mental Status: She is alert and oriented to person, place, and time.  Psychiatric:        Mood and  Affect: Mood normal.        Behavior: Behavior normal.        Thought Content: Thought content normal.        Judgment: Judgment normal.     There were no vitals taken for this visit. Wt Readings from Last 3 Encounters:  12/12/20 114 lb 9.6 oz (52 kg)  12/11/20 119 lb (54 kg)  12/10/20 120 lb (54.4 kg)    Health Maintenance Due  Topic Date Due  . COVID-19 Vaccine (1) Never done    There are no preventive care reminders to display for this patient.   Lab Results  Component Value Date   TSH 1.614 12/02/2020   Lab Results  Component Value Date   WBC 5.2  12/02/2020   HGB 12.9 12/02/2020   HCT 38.2 12/02/2020   MCV 89.0 12/02/2020   PLT 287 12/02/2020   Lab Results  Component Value Date   NA 140 12/02/2020   K 3.8 12/02/2020   CO2 23 12/02/2020   GLUCOSE 72 12/02/2020   BUN 16 12/02/2020   CREATININE 0.53 12/02/2020   BILITOT 0.9 12/02/2020   ALKPHOS 37 (L) 12/02/2020   AST 17 12/02/2020   ALT 11 12/02/2020   PROT 7.1 12/02/2020   ALBUMIN 4.2 12/02/2020   CALCIUM 9.0 12/02/2020   ANIONGAP 11 12/02/2020   Lab Results  Component Value Date   CHOL 158 12/02/2020   Lab Results  Component Value Date   HDL 58 12/02/2020   Lab Results  Component Value Date   LDLCALC 94 12/02/2020   Lab Results  Component Value Date   TRIG 28 12/02/2020   Lab Results  Component Value Date   CHOLHDL 2.7 12/02/2020   Lab Results  Component Value Date   HGBA1C 5.0 12/02/2020       Assessment & Plan:   Problem List Items Addressed This Visit   None   Visit Diagnoses    Menstrual cramps    -  Primary   Lower abdominal pain           No orders of the defined types were placed in this encounter.   Offered to give Naproxen or there NSAIDs- pt declines. Informed patient unable to give Narcotics via video visits- advised her if her abd pain/cramps are severe to go to an urgent care or the ER. Otherwise to follow up with her doctor. She voiced  understanding.   Demetrio Lapping, PA-C

## 2021-03-23 IMAGING — US ULTRASOUND ABDOMEN COMPLETE
1 series · 13 of 25 positions shown · non-contrast
Comparison: None.

CLINICAL DATA: 17-year-old female with left-sided abdominal pain x2
weeks.

EXAM:
ABDOMEN ULTRASOUND COMPLETE

[Series 1: ultrasound abdomen complete · 13 of 87 slices shown]
[im 1/87]
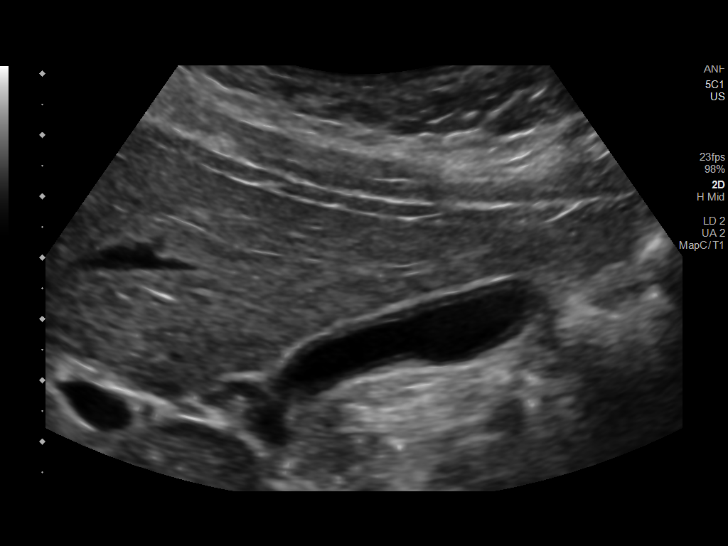
[im 8/87]
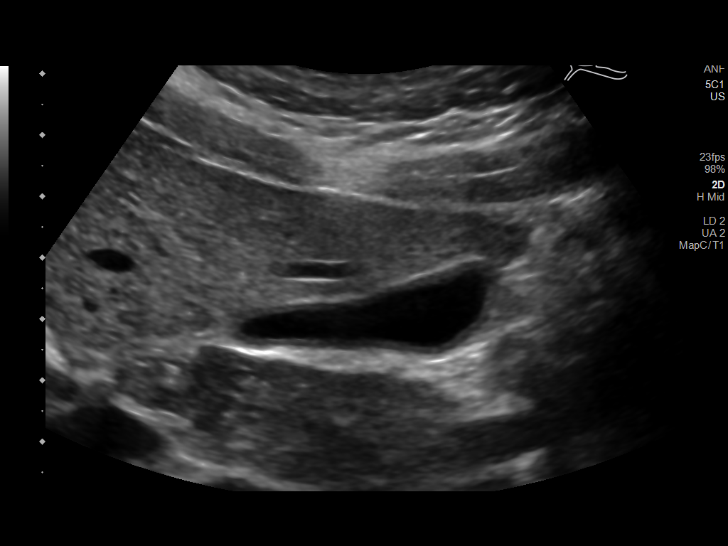
[im 15/87]
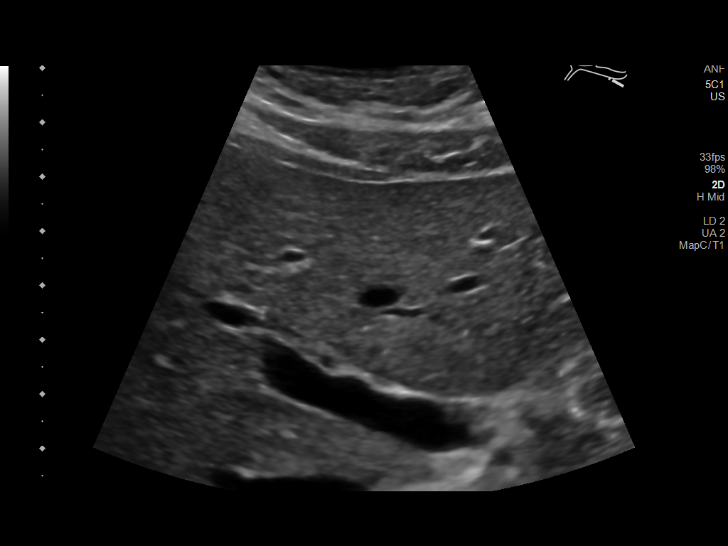
[im 22/87]
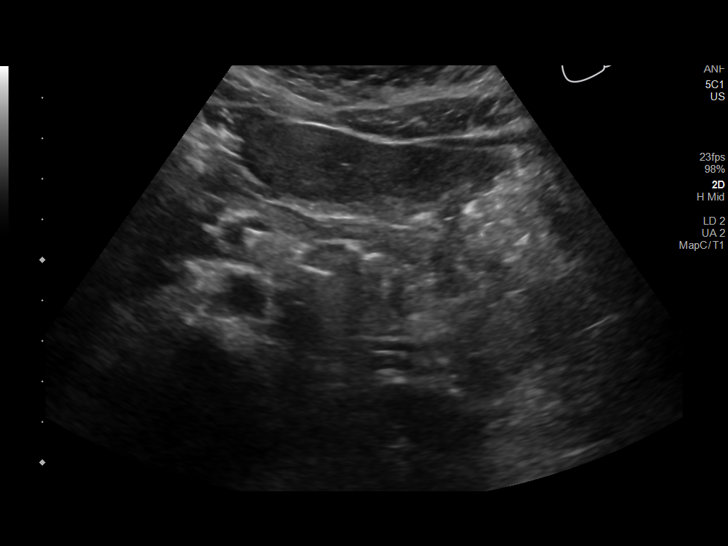
[im 29/87]
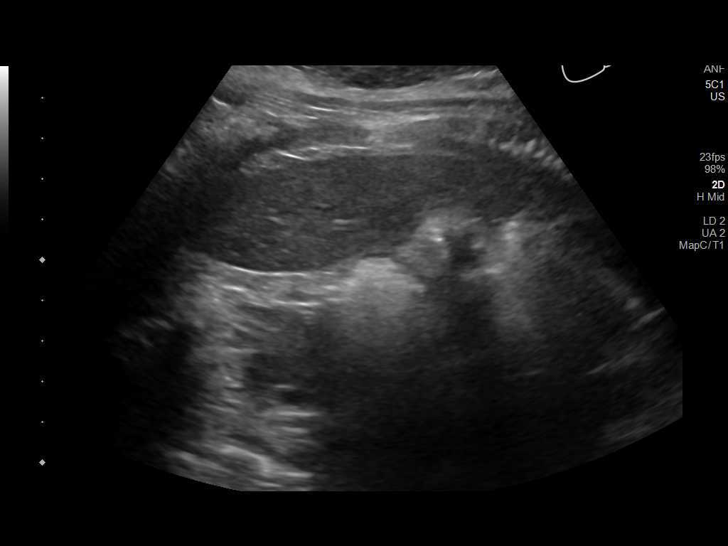
[im 36/87]
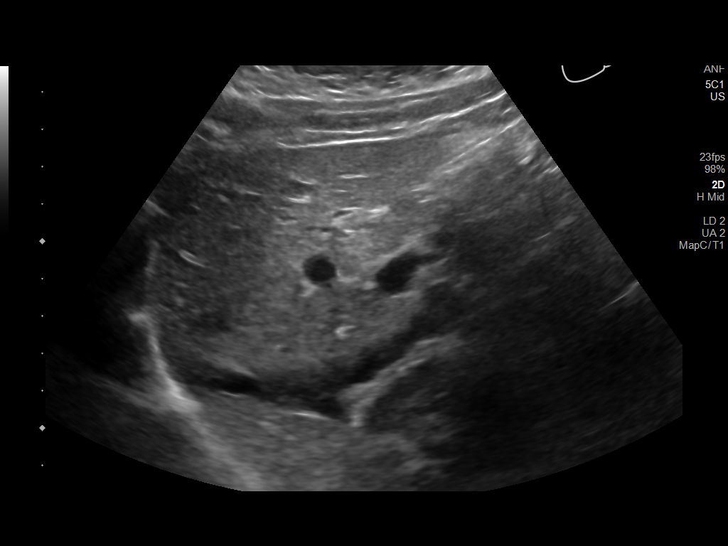
[im 44/87]
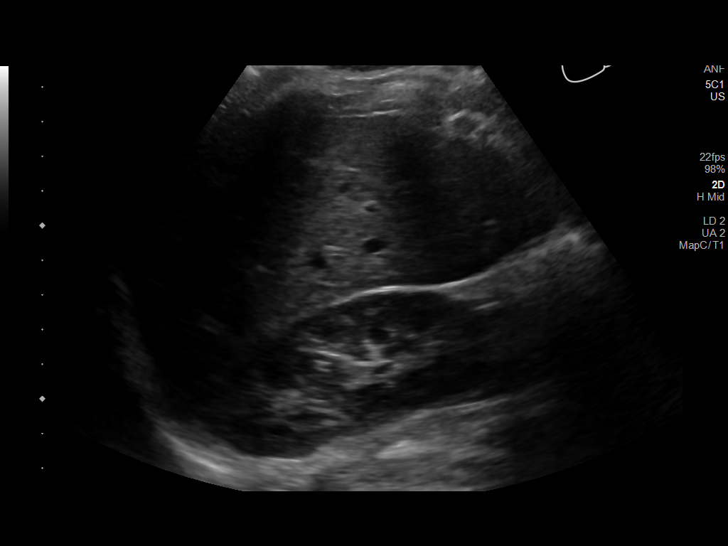
[im 51/87]
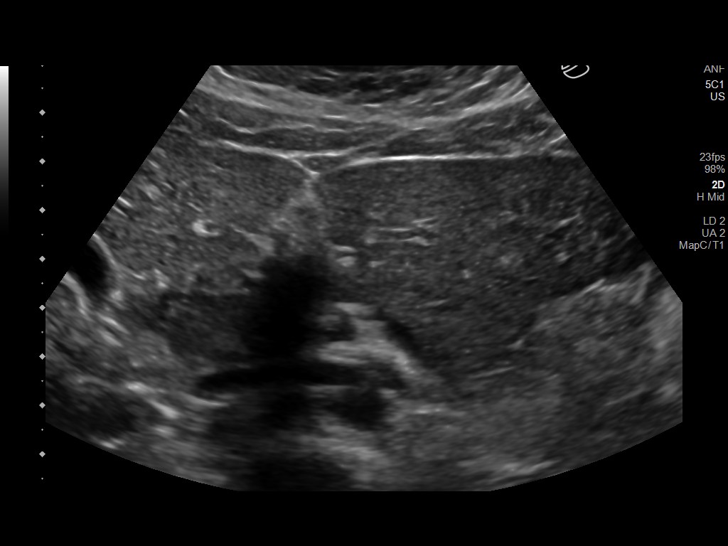
[im 58/87]
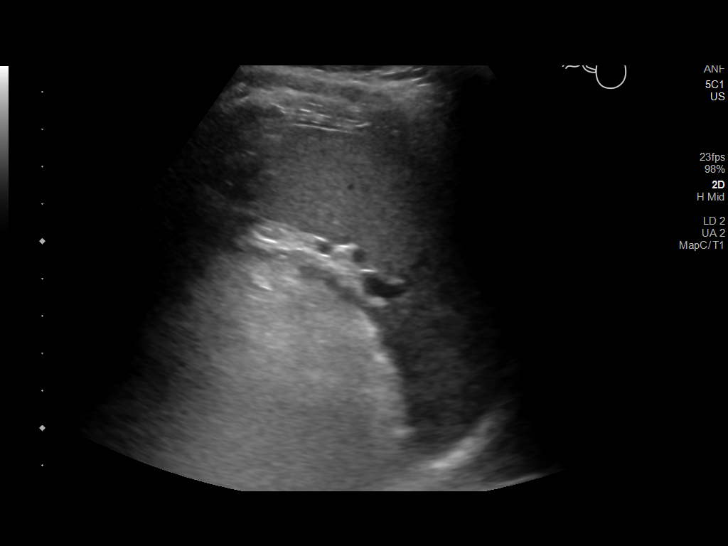
[im 65/87]
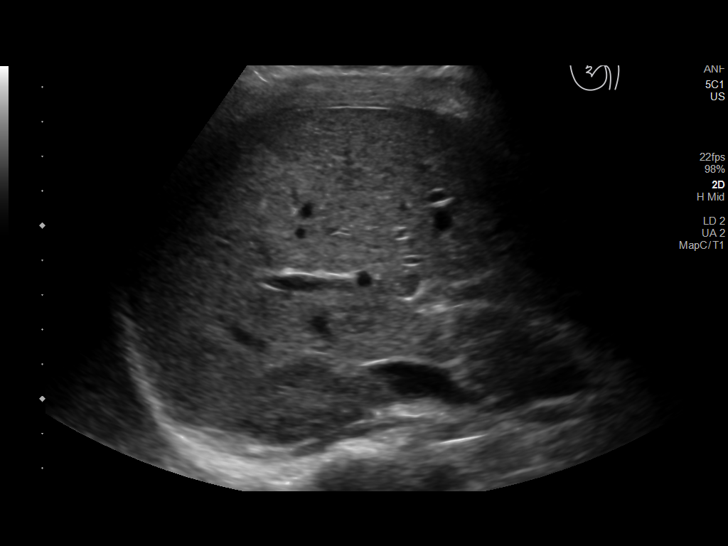
[im 72/87]
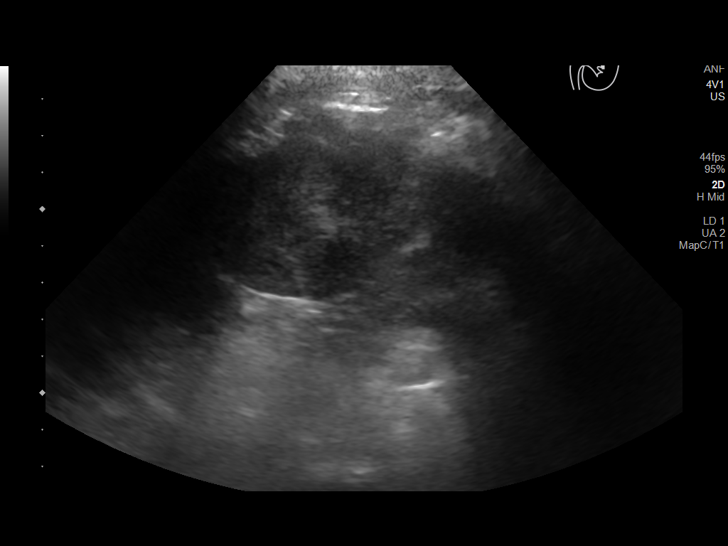
[im 79/87]
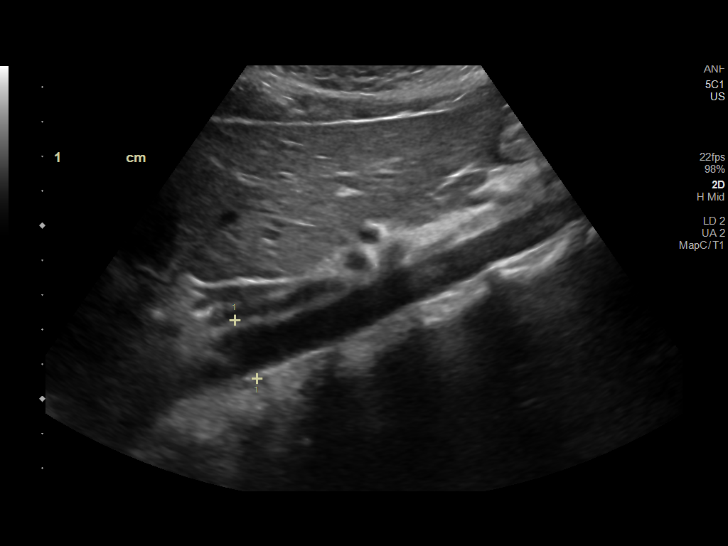
[im 87/87]
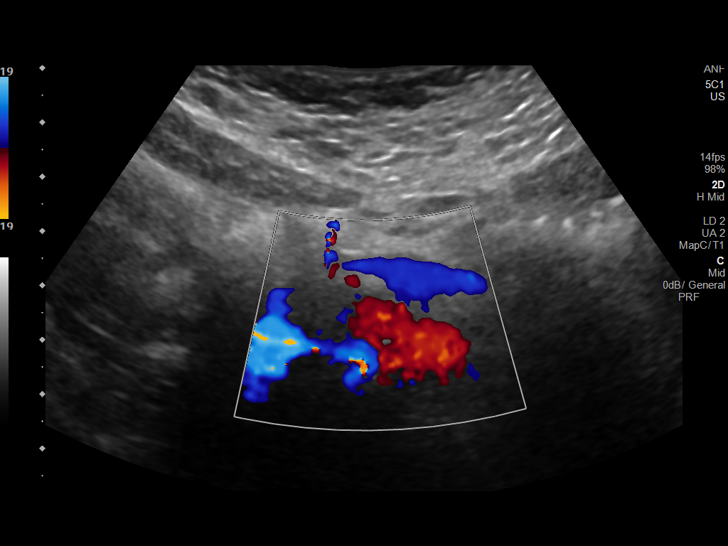

[13 of 25 positions shown; findings below may reference images not displayed]

FINDINGS: Evaluation is limited due to overlying bowel gas.

Gallbladder: No gallstones or wall thickening visualized. No
sonographic Murphy sign noted by sonographer.

Common bile duct: Diameter: 1 mm

Liver: No focal lesion identified. Within normal limits in
parenchymal echogenicity. Portal vein is patent on color Doppler
imaging with normal direction of blood flow towards the liver.

IVC: No abnormality visualized.

Pancreas: Visualized portion unremarkable.

Spleen: Size and appearance within normal limits.

Right Kidney: Length: 10 cm. Echogenicity within normal limits. No
mass or hydronephrosis visualized.

Left Kidney: Length: 11 cm. The left kidney is suboptimally
evaluated. There is no hydronephrosis or shadowing stone.
Correlation with urinalysis recommended to exclude UTI.

Abdominal aorta: No aneurysm visualized.

Other findings: None.
IMPRESSION: Suboptimal evaluation of the left kidney. No hydronephrosis
identified. Correlation with urinalysis recommended to exclude UTI.
The remainder of the visualized structures appear unremarkable.

## 2021-05-13 ENCOUNTER — Emergency Department (HOSPITAL_COMMUNITY)
Admission: EM | Admit: 2021-05-13 | Discharge: 2021-05-14 | Disposition: A | Discharge status: Home / Self Care | Payer: Medicaid Other | Attending: Emergency Medicine | Admitting: Emergency Medicine

## 2021-05-13 ENCOUNTER — Other Ambulatory Visit: Payer: Self-pay

## 2021-05-13 ENCOUNTER — Encounter (HOSPITAL_COMMUNITY): Payer: Self-pay

## 2021-05-13 DIAGNOSIS — Z5321 Procedure and treatment not carried out due to patient leaving prior to being seen by health care provider: Secondary | ICD-10-CM | POA: Insufficient documentation

## 2021-05-13 DIAGNOSIS — R102 Pelvic and perineal pain: Secondary | ICD-10-CM | POA: Insufficient documentation

## 2021-05-13 DIAGNOSIS — N946 Dysmenorrhea, unspecified: Secondary | ICD-10-CM | POA: Diagnosis not present

## 2021-05-13 LAB — CBC WITH DIFFERENTIAL/PLATELET
Abs Immature Granulocytes: 0.03 10*3/uL (ref 0.00–0.07)
Basophils Absolute: 0.1 10*3/uL (ref 0.0–0.1)
Basophils Relative: 1 %
Eosinophils Absolute: 0 10*3/uL (ref 0.0–0.5)
Eosinophils Relative: 0 %
HCT: 37.9 % (ref 36.0–46.0)
Hemoglobin: 12.8 g/dL (ref 12.0–15.0)
Immature Granulocytes: 0 %
Lymphocytes Relative: 16 %
Lymphs Abs: 1.7 10*3/uL (ref 0.7–4.0)
MCH: 29.8 pg (ref 26.0–34.0)
MCHC: 33.8 g/dL (ref 30.0–36.0)
MCV: 88.1 fL (ref 80.0–100.0)
Monocytes Absolute: 0.4 10*3/uL (ref 0.1–1.0)
Monocytes Relative: 4 %
Neutro Abs: 7.9 10*3/uL — ABNORMAL HIGH (ref 1.7–7.7)
Neutrophils Relative %: 79 %
Platelets: 302 10*3/uL (ref 150–400)
RBC: 4.3 MIL/uL (ref 3.87–5.11)
RDW: 12.9 % (ref 11.5–15.5)
WBC: 10.1 10*3/uL (ref 4.0–10.5)
nRBC: 0 % (ref 0.0–0.2)

## 2021-05-13 LAB — COMPREHENSIVE METABOLIC PANEL
ALT: 13 U/L (ref 0–44)
AST: 22 U/L (ref 15–41)
Albumin: 3.7 g/dL (ref 3.5–5.0)
Alkaline Phosphatase: 38 U/L (ref 38–126)
Anion gap: 6 (ref 5–15)
BUN: 11 mg/dL (ref 6–20)
CO2: 22 mmol/L (ref 22–32)
Calcium: 8.8 mg/dL — ABNORMAL LOW (ref 8.9–10.3)
Chloride: 109 mmol/L (ref 98–111)
Creatinine, Ser: 0.64 mg/dL (ref 0.44–1.00)
GFR, Estimated: >60 mL/min (ref >60)
Glucose, Bld: 89 mg/dL (ref 70–99)
Potassium: 3.7 mmol/L (ref 3.5–5.1)
Sodium: 137 mmol/L (ref 135–145)
Total Bilirubin: 0.4 mg/dL (ref 0.3–1.2)
Total Protein: 6.3 g/dL — ABNORMAL LOW (ref 6.5–8.1)

## 2021-05-13 LAB — URINALYSIS, ROUTINE W REFLEX MICROSCOPIC
Bilirubin Urine: NEGATIVE
Glucose, UA: NEGATIVE mg/dL
Ketones, ur: 5 mg/dL — AB
Leukocytes,Ua: NEGATIVE
Nitrite: NEGATIVE
Protein, ur: 30 mg/dL — AB
Specific Gravity, Urine: 1.026 (ref 1.005–1.030)
pH: 7 (ref 5.0–8.0)

## 2021-05-13 LAB — I-STAT BETA HCG BLOOD, ED (MC, WL, AP ONLY): I-stat hCG, quantitative: <5 m[IU]/mL (ref <5)

## 2021-05-13 LAB — LIPASE, BLOOD: Lipase: 30 U/L (ref 11–51)

## 2021-05-13 NOTE — ED Notes (Signed)
The patient did not answer for updated vitals 

## 2021-05-13 NOTE — ED Triage Notes (Signed)
1st day of menstrual cycle c/o abdominal cramping. Pt requesting ultrasound to rule out fibroids. Pain 7/10

## 2021-05-13 NOTE — ED Provider Notes (Signed)
Emergency Medicine Provider Triage Evaluation Note  Suzanne Garcia , a 20 y.o. female  was evaluated in triage.  Pt complains of lower abd discomfort and pelvic cramping. States she typically has very severe cramps with her menses and she started today. This feels like a typical period for her but she states she wants an ultrasound to r/o fibroids.  Review of Systems  Positive: Pelvic cramping, vaginal bleeding Negative: nvd  Physical Exam  BP 127/74 (BP Location: Right Arm)   Pulse 71   Temp 98.5 F (36.9 C) (Oral)   Resp 16   Ht 4\' 10"  (1.473 m)   Wt 49.4 kg   LMP 05/13/2021   SpO2 98%   BMI 22.78 kg/m  Gen:   Awake, no distress   Resp:  Normal effort  MSK:   Moves extremities without difficulty  Other:  Abd soft and nontender  Medical Decision Making  Medically screening exam initiated at 7:34 PM.  Appropriate orders placed.  ELEASE SWARM was informed that the remainder of the evaluation will be completed by another provider, this initial triage assessment does not replace that evaluation, and the importance of remaining in the ED until their evaluation is complete.     Huey Romans, PA-C 05/13/21 1937    07/13/21, DO 05/14/21 0025

## 2021-05-14 ENCOUNTER — Emergency Department (HOSPITAL_BASED_OUTPATIENT_CLINIC_OR_DEPARTMENT_OTHER)
Admission: EM | Admit: 2021-05-14 | Discharge: 2021-05-14 | Disposition: A | Discharge status: Home / Self Care | Payer: Medicaid Other | Source: Home / Self Care | Attending: Emergency Medicine | Admitting: Emergency Medicine

## 2021-05-14 ENCOUNTER — Encounter (HOSPITAL_BASED_OUTPATIENT_CLINIC_OR_DEPARTMENT_OTHER): Payer: Self-pay | Admitting: Emergency Medicine

## 2021-05-14 DIAGNOSIS — N946 Dysmenorrhea, unspecified: Secondary | ICD-10-CM | POA: Insufficient documentation

## 2021-05-14 LAB — WET PREP, GENITAL
Clue Cells Wet Prep HPF POC: NONE SEEN
Sperm: NONE SEEN
Trich, Wet Prep: NONE SEEN
Yeast Wet Prep HPF POC: NONE SEEN

## 2021-05-14 MED ORDER — TRAMADOL HCL 50 MG PO TABS: 25.0000 mg | ORAL_TABLET | Freq: Four times a day (QID) | ORAL | 0 refills | Status: DC | PRN

## 2021-05-14 NOTE — Discharge Instructions (Addendum)
Contact a health care provider if:  You have pain that gets worse or does not get better with medicine.  You have pain with sex.  You develop nausea or vomiting with your period that is not controlled with medicine.  Get help right away if:  You faint.

## 2021-05-14 NOTE — ED Provider Notes (Signed)
[facility] EMERGENCY DEPARTMENT Provider Note   CSN: [id number] Arrival date & time: 05/14/21  9381     History Chief Complaint  Patient presents with   Abdominal Cramping    Suzanne Garcia is a 20 y.o. female who presents emergency department chief complaint of dysmenorrhea.  Patient states that she has severe abdominal cramping every time she menstruates.  She began her period yesterday.  She went to another emergency department but left due to the wait time.  She had a negative pregnancy test, CBC, CMP, lipase and urinalysis all within normal limits.  Patient rate her pain at 10 out of 10.  She has been taking naproxen without relief of her symptoms.  She denies any vaginal discharge or urinary symptoms.  She is followed at [facility] with [facility].  She is sexually active with a single female partner.     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   ADHD 06/04/2018   Short stature 06/24/2015   Failed vision screen 06/24/2015    Past Surgical History:  Procedure Laterality Date   NO PAST SURGERIES       OB History     Gravida  1   Para  0   Term  0   Preterm  0   AB  1   Living  0      SAB  1   IAB  0   Ectopic  0   Multiple  0   Live Births  0           Family History  Problem Relation Age of Onset   Obesity Mother    Asthma Mother    Miscarriages / [place] Mother    Diabetes Mother    Depression Mother    Mental illness Father    ADD / ADHD Sister    Bipolar disorder Sister    Heart disease Maternal Grandmother    Diabetes Maternal Grandmother    Bipolar disorder Maternal Grandmother    Bipolar disorder Maternal Grandfather    Multiple sclerosis Maternal  Grandfather     Social History   Tobacco Use   Smoking status: Never   Smokeless tobacco: Never  Vaping Use   Vaping Use: Never used  Substance Use Topics   Alcohol use: Yes    Comment: last drink was in November, occasionally drinker (3 shots)   Drug use: Yes    Types: Marijuana    Comment: last use of "weed" in Dec. (prior use was once every other week)    Home Medications Prior to Admission medications   Medication Sig Start Date End Date Taking? Authorizing Provider  hydrOXYzine (ATARAX/VISTARIL) 25 MG tablet Take 1 tablet (25 mg total) by mouth 3 (three) times daily as needed for anxiety. 12/05/20   Armandina Stammer I, NP  lidocaine (LIDODERM) 5 % Place 1 patch onto the skin daily. Remove & Discard patch within 12 hours or as directed by MD 12/10/20   Joy, Shawn C, PA-C  methocarbamol (ROBAXIN) 500 MG tablet Take 1 tablet (500 mg total) by mouth 2 (two) times daily. 12/10/20   Joy, Shawn C, PA-C  mirtazapine (REMERON SOL-TAB) 15 MG disintegrating tablet  Take 1 tablet (15 mg total) by mouth at bedtime. For depression.sleep 12/05/20   Armandina Stammer I, NP  naproxen (NAPROSYN) 250 MG tablet Take 1 tablet (250 mg total) by mouth 2 (two) times daily with a meal. 12/12/20   Rasch, Harolyn Rutherford, NP  traZODone (DESYREL) 50 MG tablet Take 1 tablet (50 mg total) by mouth at bedtime as needed for sleep. 12/05/20   Armandina Stammer I, NP    Allergies    Ibuprofen  Review of Systems   Review of Systems Ten systems reviewed and are negative for acute change, except as noted in the HPI.   Physical Exam Updated Vital Signs BP 110/84 (BP Location: Right Arm)   Pulse 77   Temp 98.1 F (36.7 C)   Resp 17   LMP 05/13/2021   SpO2 99%   Physical Exam Vitals and nursing note reviewed.  Constitutional:      General: She is not in acute distress.    Appearance: She is well-developed. She is not diaphoretic.  HENT:     Head: Normocephalic and atraumatic.     Right Ear: External ear normal.     Left Ear:  External ear normal.     Nose: Nose normal.     Mouth/Throat:     Mouth: Mucous membranes are moist.  Eyes:     General: No scleral icterus.    Conjunctiva/sclera: Conjunctivae normal.  Cardiovascular:     Rate and Rhythm: Normal rate and regular rhythm.     Heart sounds: Normal heart sounds. No murmur heard.   No friction rub. No gallop.  Pulmonary:     Effort: Pulmonary effort is normal. No respiratory distress.     Breath sounds: Normal breath sounds.  Abdominal:     General: Bowel sounds are normal. There is no distension.     Palpations: Abdomen is soft. There is no mass.     Tenderness: There is no abdominal tenderness. There is no guarding.  Genitourinary:    Comments: Pelvic exam: Normal external female genitalia, cervix with blood in the vaginal vault, bleeding from the cervical os consistent with menstruation, no discharge.  Vaginal walls pink and healthy.  No adnexal tenderness, midline tenderness superiorly with palpation of the pelvic region.  No cervical motion tenderness. Musculoskeletal:     Cervical back: Normal range of motion.  Skin:    General: Skin is warm and dry.  Neurological:     Mental Status: She is alert and oriented to person, place, and time.  Psychiatric:        Behavior: Behavior normal.    ED Results / Procedures / Treatments   Labs (all labs ordered are listed, but only abnormal results are displayed) Labs Reviewed  WET PREP, GENITAL  GC/CHLAMYDIA PROBE AMP ([place]) NOT AT [facility]    EKG None  Radiology No results found.  Procedures Procedures   Medications Ordered in ED Medications - No data to display  ED Course  I have reviewed the triage vital signs and the nursing notes.  Pertinent labs & imaging results that were available during my care of the patient were reviewed by me and considered in my medical decision making (see chart for details).    MDM Rules/Calculators/A&P                          IW:[id number] bleeding and  pelvic pain VS: BP 119/75   Pulse 65   Temp 98.1 F (  36.7 C)   Resp 17   LMP 05/13/2021   SpO2 99%   JJ:OACZYSA is gathered by patient and emr. Previous records obtained and reviewed. DDX:The patient's complaint of vaginal bleeding involves an extensive number of diagnostic and treatment options, and is a complaint that carries with it a high risk of complications, morbidity, and potential mortality. Given the large differential diagnosis, medical decision making is of high complexity. .Differential diagnosis for nonpregnant vaginal bleeding includes but is not limited to systemic causes such as cirrhosis of the liver, coagulopathy such as ITP or von Willebrand's disease, strep vaginitis, HRT, hypothyroidism, secondary anovulation.  Reproductive tract causes include adenomyosis, atrophic endometrium, dysfunctional uterine bleeding, endometriosis, fibroids, foreign body, infection, IUD, neoplasia or vaginal trauma.  Labs: I ordered reviewed and interpreted labs which include wet prep- few whites, g/c chlamydia pending Labs drawn within last 24 hours  Preg- negative Lipase, cbc, cmp, urine all wnl. Imaging:  EKG: Consults:  MDM: Patient with pain during menstruation. Her pelvic exam is tender but, non-focal and otherwise benign.  I have discussed patient's need to follow up very closely with gyn for further evaluation including potential biopsy to r/o endometriosis. She does not appear to have an emergent cause of her sxs. Hgb wnl- no evidence of hemorrhage. PDMP reviewed during this encounter. Tramadol for SEVERE pain.   Patient disposition:The patient appears reasonably screened and/or stabilized for discharge and I doubt any other medical condition or other [facility] requiring further screening, evaluation, or treatment in the ED at this time prior to discharge. I have discussed lab and/or imaging findings with the patient and answered all questions/concerns to the best of my ability.I have  discussed return precautions and OP follow up.    Final Clinical Impression(s) / ED Diagnoses Final diagnoses:  None    Rx / DC Orders ED Discharge Orders     None        Arthor Captain, PA-C 05/17/21 0930    Terald Sleeper, MD 05/17/21 [phone]

## 2021-05-14 NOTE — ED Triage Notes (Signed)
Pt reports starting her menstrual cycle yesterday. Pt reports abdominal cramping. Patient is asking for medication for her menstrual cramps. States she has tried naproxen and "it does not help." States she can not take ibuprofen.

## 2021-05-15 LAB — GC/CHLAMYDIA PROBE AMP (~~LOC~~) NOT AT ARMC
Chlamydia: NEGATIVE
Comment: NEGATIVE
Comment: NORMAL
Neisseria Gonorrhea: NEGATIVE

## 2021-05-29 ENCOUNTER — Ambulatory Visit: Payer: Medicaid Other | Admitting: Obstetrics and Gynecology

## 2021-06-01 ENCOUNTER — Emergency Department (HOSPITAL_BASED_OUTPATIENT_CLINIC_OR_DEPARTMENT_OTHER)
Admission: EM | Admit: 2021-06-01 | Discharge: 2021-06-01 | Disposition: A | Discharge status: Home / Self Care | Payer: Medicaid Other | Attending: Emergency Medicine | Admitting: Emergency Medicine

## 2021-06-01 ENCOUNTER — Other Ambulatory Visit: Payer: Self-pay

## 2021-06-01 ENCOUNTER — Encounter (HOSPITAL_BASED_OUTPATIENT_CLINIC_OR_DEPARTMENT_OTHER): Payer: Self-pay

## 2021-06-01 DIAGNOSIS — Y92410 Unspecified street and highway as the place of occurrence of the external cause: Secondary | ICD-10-CM | POA: Insufficient documentation

## 2021-06-01 DIAGNOSIS — M25511 Pain in right shoulder: Secondary | ICD-10-CM | POA: Insufficient documentation

## 2021-06-01 DIAGNOSIS — M542 Cervicalgia: Secondary | ICD-10-CM | POA: Diagnosis not present

## 2021-06-01 DIAGNOSIS — Z5321 Procedure and treatment not carried out due to patient leaving prior to being seen by health care provider: Secondary | ICD-10-CM | POA: Insufficient documentation

## 2021-06-01 DIAGNOSIS — M79661 Pain in right lower leg: Secondary | ICD-10-CM | POA: Diagnosis not present
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
## 2021-06-02 ENCOUNTER — Emergency Department (HOSPITAL_BASED_OUTPATIENT_CLINIC_OR_DEPARTMENT_OTHER): Payer: Medicaid Other

## 2021-06-02 ENCOUNTER — Other Ambulatory Visit: Payer: Self-pay

## 2021-06-02 ENCOUNTER — Emergency Department (HOSPITAL_BASED_OUTPATIENT_CLINIC_OR_DEPARTMENT_OTHER)
Admission: EM | Admit: 2021-06-02 | Discharge: 2021-06-02 | Disposition: A | Discharge status: Home / Self Care | Payer: Medicaid Other | Attending: Emergency Medicine | Admitting: Emergency Medicine

## 2021-06-02 DIAGNOSIS — Y92410 Unspecified street and highway as the place of occurrence of the external cause: Secondary | ICD-10-CM | POA: Insufficient documentation

## 2021-06-02 DIAGNOSIS — M25521 Pain in right elbow: Secondary | ICD-10-CM | POA: Insufficient documentation

## 2021-06-02 DIAGNOSIS — R519 Headache, unspecified: Secondary | ICD-10-CM | POA: Diagnosis not present

## 2021-06-02 DIAGNOSIS — M545 Low back pain, unspecified: Secondary | ICD-10-CM | POA: Insufficient documentation

## 2021-06-02 DIAGNOSIS — M542 Cervicalgia: Secondary | ICD-10-CM | POA: Insufficient documentation

## 2021-06-02 DIAGNOSIS — M25511 Pain in right shoulder: Secondary | ICD-10-CM | POA: Diagnosis not present

## 2021-06-02 MED ORDER — LIDOCAINE 5 % EX PTCH: 1.0000 | MEDICATED_PATCH | CUTANEOUS | 0 refills | Status: DC

## 2021-06-02 MED ORDER — METHOCARBAMOL 500 MG PO TABS: 500.0000 mg | ORAL_TABLET | Freq: Two times a day (BID) | ORAL | 0 refills | Status: DC
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
## 2021-06-02 NOTE — ED Notes (Signed)
ED Provider at bedside. 

## 2021-06-02 NOTE — ED Notes (Signed)
Pt discharged to home. Discharge instructions have been discussed with patient and/or family members. Pt verbally acknowledges understanding d/c instructions, and endorses comprehension to checkout at registration before leaving.  °

## 2021-06-02 NOTE — Discharge Instructions (Addendum)
Your examination today is most concerning for a muscular injury 1. Medications: alternate ibuprofen and tylenol for pain control, take all usual home medications as they are prescribed 2. Treatment: rest, ice, elevate and use an ACE wrap or other compressive therapy to decrease swelling. Also drink plenty of fluids and do plenty of gentle stretching and move the affected muscle through its normal range of motion to prevent stiffness. 3. Follow Up: If your symptoms do not improve please follow up with orthopedics/sports medicine or your PCP for discussion of your diagnoses and further evaluation after today's visit; if you do not have a primary care doctor use the resource guide provided to find one; Please return to the ER for worsening symptoms or other concerns.    Please use Tylenol or ibuprofen for pain.  You may use 600 mg ibuprofen every 6 hours or 1000 mg of Tylenol every 6 hours.  You may choose to alternate between the 2.  This would be most effective.  Not to exceed 4 g of Tylenol within 24 hours.  Not to exceed 3200 mg ibuprofen 24 hours.  You were given a prescription for Robaxin which is a muscle relaxer.  You should not drive, work, consume alcohol, or operate machinery while taking this medication as it can make you very drowsy.    You were given a prescription for Lidocaine patch to use as needed for pain over the area.   You have been given a referral to a Orthopedic Doctor who will further evaluate your shoulder!  You were seen in the ED and you have been diagnosed with a mild concussion. You may have decreased attention, concentration, amnesia, memory, processing speed, reaction time for up to a week. We encourage mental and physical rest.  Please refrain from intense physical activity until symptoms resolve.  Please follow-up with your primary care doctor for further management.  Please return to the emergency department if you develop one-sided weakness, numbness to an extremity,  vision changes, seizure, confusion, or gait difficulties.
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
## 2021-06-03 ENCOUNTER — Ambulatory Visit (INDEPENDENT_AMBULATORY_CARE_PROVIDER_SITE_OTHER): Payer: Medicaid Other | Admitting: Women's Health

## 2021-06-03 ENCOUNTER — Encounter: Payer: Self-pay | Admitting: Women's Health

## 2021-06-03 VITALS — BP 94/60 | HR 60 | Ht <= 58 in | Wt 110.0 lb

## 2021-06-03 DIAGNOSIS — N946 Dysmenorrhea, unspecified: Secondary | ICD-10-CM
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
## 2021-06-03 NOTE — Progress Notes (Signed)
Pt is here to discuss issues with painful cycle. States she would like to discuss continuing tramadol while on the cycle. Pt states she has used tylenol, ibuprofen, and naproxen in the past and it is not helpful. LMP: 05/13/21. BCM: none. Last U/S was completed in 2019. Pt had yearly exam in 12/2020.

## 2021-06-03 NOTE — Patient Instructions (Addendum)
For your heavy menses and cramping, take EITHER Advil or Aleve continuously for the first 2-3days of your cycle. This should lessen your bleeding and pain. If there is not sufficient improvement in 90mo, RTC for additional treatment options. You can purchase these medications over the counter without a prescription. Be sure not to exceed the amount recommended in the instructions on the medication bottle.    Dysmenorrhea Dysmenorrhea refers to cramps caused by the muscles of the uterus tightening (contracting) during a menstrual period. Dysmenorrhea may be mild, or it may be severe enough to interfere with everyday activities for a few days each month. Primary dysmenorrhea is menstrual cramps that last a couple of days when a female starts having menstrual periods or soon after. As a female gets older or has a baby, the cramps will usually lessen or disappear. Secondary dysmenorrhea begins later in life and is caused by a disorder in the reproductive system. It lasts longer, and it may cause more pain than primary dysmenorrhea. The pain may start before the period and last a few days after the period. What are the causes? Dysmenorrhea is usually caused by an underlying problem, such as: Endometriosis. The tissue that lines the uterus (endometrium) growing outside of the uterus in other areas of the body. Adenomyosis. Endometrial tissue growing into the muscular walls of the uterus. Pelvic congestive syndrome. Blood vessels in the pelvis that fill with blood just before the menstrual period. Overgrowth of cells (polyps) in the endometrium or the lower part of the uterus (cervix). Uterine prolapse. The uterus dropping down into the vagina due to stretched or weak muscles. Bladder problems, such as infection or inflammation. Intestinal problems, such as a tumor or irritable bowel syndrome. Cancer of the reproductive organs or bladder. Other causes of this condition may result from: A severely tipped  uterus. A cervix that is closed or has a small opening. Noncancerous (benign) tumors in the uterus (fibroids). Pelvic inflammatory disease (PID). Pelvic scarring (adhesions) from a previous surgery. An ovarian cyst. An IUD (intrauterine device). What increases the risk? You are more likely to develop this condition if: You are younger than 21 years old. You started puberty early. You have irregular or heavy bleeding. You have never given birth. You have a family history of dysmenorrhea. You smoke or use nicotine products. You have high body weight or a low body weight. What are the signs or symptoms? Symptoms of this condition include: Cramping, throbbing pain in lower abdomen or lower back, or a feeling of fullness in the lower abdomen. Periods lasting for longer than 7 days. Headaches. Bloating. Fatigue. Nausea or vomiting. Diarrhea or loose stools. Sweating or dizziness. How is this diagnosed? This condition may be diagnosed based on: Your symptoms. Your medical history. A physical exam. Blood tests. A Pap test. This is a test in which cells from the cervix are tested for signs of cancer or infection. A pregnancy test. You may also have other tests, including: Imaging tests, such as: Ultrasound. A procedure to remove and examine a sample of endometrial tissue (dilation and curettage, D&C). A procedure to visually examine the inside of: The uterus (hysteroscopy). The abdomen or pelvis (laparoscopy). The bladder (cystoscopy). X-rays. CT scan. MRI. How is this treated? Treatment depends on the cause of the dysmenorrhea. Treatment may include medicines, such as: Pain medicines. Hormone replacement therapy. Injections of progesterone to stop the menstrual period. Birth control pills that contain the hormone progesterone. An IUD that contains the hormone progesterone. NSAIDs, such  as ibuprofen. These may help to stop the production of hormones that cause  cramps. Antidepressant medicines. Other treatment may include: Surgery to remove adhesions, endometriosis, ovarian cysts, fibroids, or the entire uterus (hysterectomy). Endometrial ablation. This is a procedure to destroy the endometrium. Presacral neurectomy. This is a procedure to cut the nerves in the bottom of the spine (sacrum) that go to the reproductive organs. Sacral nerve stimulation. This is a procedure to apply an electric current to nerves in the sacrum. Exercise and physical therapy. Meditation, yoga, and acupuncture. Work with your health care provider to determine what treatment or combination of treatments is best for you. Follow these instructions at home: Relieving pain and cramping  If directed, apply heat to your lower back or abdomen when you experience pain or cramps. Use the heat source that your health care provider recommends, such as a moist heat pack or a heating pad. Place a towel between your skin and the heat source. Leave the heat on for 20-30 minutes. Remove the heat if your skin turns bright red. This is especially important if you are unable to feel pain, heat, or cold. You may have a greater risk of getting burned. Do not sleep with a heating pad on. Exercise. Activities such as walking, swimming, or biking can help to relieve cramps. Massage your lower back or abdomen to help relieve pain. General instructions Take over-the-counter and prescription medicines only as told by your health care provider. Ask your health care provider if the medicine prescribed to you requires you to avoid driving or using machinery. Avoid alcohol and caffeine during and right before your period. These can make cramps worse. Do not use any products that contain nicotine or tobacco. These products include cigarettes, chewing tobacco, and vaping devices, such as e-cigarettes. If you need help quitting, ask your health care provider. Keep all follow-up visits. This is  important. Contact a health care provider if: You have pain that gets worse or does not get better with medicine. You have pain with sex. You develop nausea or vomiting with your period that is not controlled with medicine. Get help right away if: You faint. Summary Dysmenorrhea refers to cramps caused by the muscles of the uterus tightening (contracting) during a menstrual period. Dysmenorrhea may be mild, or it may be severe enough to interfere with everyday activities for a few days each month. Treatment depends on the cause of the dysmenorrhea. Work with your health care provider to determine what treatment or combination of treatments is best for you. This information is not intended to replace advice given to you by your health care provider. Make sure you discuss any questions you have with your health care provider. Document Revised: 05/07/2020 Document Reviewed: 05/07/2020 Elsevier Patient Education  2022 [employer].

## 2021-06-10 ENCOUNTER — Ambulatory Visit (HOSPITAL_BASED_OUTPATIENT_CLINIC_OR_DEPARTMENT_OTHER)
Admission: RE | Admit: 2021-06-10 | Discharge: 2021-06-10 | Disposition: A | Discharge status: Home / Self Care | Payer: Medicaid Other | Source: Ambulatory Visit | Attending: Women's Health | Admitting: Women's Health

## 2021-06-10 ENCOUNTER — Other Ambulatory Visit: Payer: Self-pay

## 2021-06-10 DIAGNOSIS — N946 Dysmenorrhea, unspecified: Secondary | ICD-10-CM | POA: Insufficient documentation

## 2021-06-12 ENCOUNTER — Other Ambulatory Visit: Payer: Self-pay

## 2021-06-12 ENCOUNTER — Telehealth: Payer: Medicaid Other | Admitting: Nurse Practitioner

## 2021-06-12 ENCOUNTER — Emergency Department (HOSPITAL_BASED_OUTPATIENT_CLINIC_OR_DEPARTMENT_OTHER)
Admission: EM | Admit: 2021-06-12 | Discharge: 2021-06-13 | Disposition: A | Discharge status: Home / Self Care | Payer: Medicaid Other | Attending: Emergency Medicine | Admitting: Emergency Medicine

## 2021-06-12 ENCOUNTER — Encounter (HOSPITAL_BASED_OUTPATIENT_CLINIC_OR_DEPARTMENT_OTHER): Payer: Self-pay

## 2021-06-12 DIAGNOSIS — R110 Nausea: Secondary | ICD-10-CM | POA: Diagnosis present

## 2021-06-12 DIAGNOSIS — N946 Dysmenorrhea, unspecified: Secondary | ICD-10-CM | POA: Insufficient documentation

## 2021-06-12 MED ORDER — HYDROCODONE-ACETAMINOPHEN 5-325 MG PO TABS
1.0000 | ORAL_TABLET | Freq: Once | ORAL | Status: AC
  Administered 2021-06-13: 1 via ORAL
  Filled 2021-06-12: qty 1

## 2021-06-12 NOTE — Progress Notes (Signed)
Virtual Visit Consent   Suzanne Garcia, you are scheduled for a virtual visit with [name], FNP, a [facility] provider, today.     Just as with appointments in the office, your consent must be obtained to participate.  Your consent will be active for this visit and any virtual visit you may have with one of our providers in the next 365 days.     If you have a MyChart account, a copy of this consent can be sent to you electronically.  All virtual visits are billed to your insurance company just like a traditional visit in the office.    As this is a virtual visit, video technology does not allow for your provider to perform a traditional examination.  This may limit your provider's ability to fully assess your condition.  If your provider identifies any concerns that need to be evaluated in person or the need to arrange testing (such as labs, EKG, etc.), we will make arrangements to do so.     Although advances in technology are sophisticated, we cannot ensure that it will always work on either your end or our end.  If the connection with a video visit is poor, the visit may have to be switched to a telephone visit.  With either a video or telephone visit, we are not always able to ensure that we have a secure connection.     I need to obtain your verbal consent now.   Are you willing to proceed with your visit today? YES   CLYDINE PARKISON has provided verbal consent on 06/12/2021 for a virtual visit (video or telephone).   [name], FNP   Date: 06/12/2021 7:23 PM   Virtual Visit via Video Note   I, [name], connected with [name] ([id number], 2001/05/14) on 06/12/21 at  7:30 PM EDT by a video-enabled telemedicine application and verified that I am speaking with the correct person using two identifiers.  Location: Patient: Virtual Visit Location Patient: Home Provider: Virtual Visit Location Provider: Mobile   I discussed the  limitations of evaluation and management by telemedicine and the availability of in person appointments. The patient expressed understanding and agreed to proceed.    History of Present Illness: Suzanne Garcia is a 20 y.o. who identifies as a female who was assigned female at birth, and is being seen today for menustral cramps.  HPI:   Patient says that her menses started this morning and she is having severe cramps. She went to the hospital with menustral cramps last month and they gave tramadol. She has no more. She says that motrin and tylenol do not work. Her bleeding is as usual but is cramping.  Problems:  Patient Active Problem List   Diagnosis Date Noted   Suicidal ideation 12/02/2020   MDD (major depressive disorder), recurrent severe, without psychosis (HCC) 12/01/2020   ADHD 06/04/2018   Short stature 06/24/2015   Failed vision screen 06/24/2015    Allergies:  Allergies  Allergen Reactions   Ibuprofen     Patient states that it causes "stomach thinning"    Medications:  Current Outpatient Medications:    hydrOXYzine (ATARAX/VISTARIL) 25 MG tablet, Take 1 tablet (25 mg total) by mouth 3 (three) times daily as needed for anxiety. (Patient not taking: Reported on 06/03/2021), Disp: 75 tablet, Rfl: 0   lidocaine (LIDODERM) 5 %, Place 1 patch onto the skin daily. Remove & Discard patch within 12 hours or as directed by  MD (Patient not taking: Reported on 06/03/2021), Disp: 30 patch, Rfl: 0   methocarbamol (ROBAXIN) 500 MG tablet, Take 1 tablet (500 mg total) by mouth 2 (two) times daily. (Patient not taking: Reported on 06/03/2021), Disp: 20 tablet, Rfl: 0   mirtazapine (REMERON SOL-TAB) 15 MG disintegrating tablet, Take 1 tablet (15 mg total) by mouth at bedtime. For depression.sleep (Patient not taking: Reported on 06/03/2021), Disp: 30 tablet, Rfl: 0   naproxen (NAPROSYN) 250 MG tablet, Take 1 tablet (250 mg total) by mouth 2 (two) times daily with a meal. (Patient not taking:  Reported on 06/03/2021), Disp: 30 tablet, Rfl: 0   traMADol (ULTRAM) 50 MG tablet, Take 0.5-1 tablets (25-50 mg total) by mouth every 6 (six) hours as needed for severe pain., Disp: 10 tablet, Rfl: 0   traZODone (DESYREL) 50 MG tablet, Take 1 tablet (50 mg total) by mouth at bedtime as needed for sleep. (Patient not taking: Reported on 06/03/2021), Disp: 30 tablet, Rfl: 0  Observations/Objective: Patient is well-developed, well-nourished in no acute distress.  Resting comfortably  at home.  Head is normocephalic, atraumatic.  No labored breathing.  Speech is clear and coherent with logical content.  Patient is alert and oriented at baseline.  Patient is crying and vomiting  Assessment and Plan:  [name] in today with chief complaint of No chief complaint on file.   1. Dysmenorrhea Patient was told that we cannot do controlled medications in a video visit. She was offered naprosyn, but since it is similar to motrin she refused medication 'she was instructed to try a heating pad or warm bath along with 4 regular strength motrin Otherwise she would need to go to urgent care or ED for pain medication.    Follow Up Instructions: I discussed the assessment and treatment plan with the patient. The patient was provided an opportunity to ask questions and all were answered. The patient agreed with the plan and demonstrated an understanding of the instructions.  A copy of instructions were sent to the patient via MyChart.  The patient was advised to call back or seek an in-person evaluation if the symptoms worsen or if the condition fails to improve as anticipated.  Time:  I spent 8 minutes with the patient via telehealth technology discussing the above problems/concerns.    [name], FNP

## 2021-06-12 NOTE — ED Provider Notes (Signed)
[facility] EMERGENCY DEPARTMENT Provider Note   CSN: [id number] Arrival date & time: 06/12/21  1952     History Chief Complaint  Patient presents with   Painful menses    Suzanne Garcia is a 20 y.o. female.  Patient with a history of anxiety and bipolar disorder here with painful menses onset today.  States similar symptoms last month when she was seen in the ED and placed on tramadol.  She was requesting tramadol again.  She called her PCP today who would not give her tramadol over the phone and referred her to the ED.  She saw gynecology several days ago and had an ultrasound which was reassuring.  States she has painful menses every month that she normally controls with ibuprofen but the pain is severe at this time.  Nausea but no vomiting.  No diarrhea.  No pain with urination or blood in the urine.  No cough or fever.  No previous abdominal surgeries. She is requesting tramadol again for pain management but reports her pain is better than when she first got here.  The history is provided by the patient.      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   ADHD 06/04/2018   Short stature 06/24/2015   Failed vision screen 06/24/2015    Past Surgical History:  Procedure Laterality Date   NO PAST SURGERIES       OB History     Gravida  1   Para  0   Term  0   Preterm  0   AB  1   Living  0      SAB  1   IAB  0   Ectopic  0   Multiple  0   Live Births  0           Family History  Problem Relation Age of Onset   Obesity Mother    Asthma Mother    Miscarriages / [place] Mother    Diabetes Mother    Depression Mother    Mental illness Father    ADD / ADHD Sister    Bipolar disorder Sister    Heart disease  Maternal Grandmother    Diabetes Maternal Grandmother    Bipolar disorder Maternal Grandmother    Bipolar disorder Maternal Grandfather    Multiple sclerosis Maternal Grandfather     Social History   Tobacco Use   Smoking status: Never   Smokeless tobacco: Never  Vaping Use   Vaping Use: Never used  Substance Use Topics   Alcohol use: Not Currently   Drug use: Not Currently    Home Medications Prior to Admission medications   Medication Sig Start Date End Date Taking? Authorizing Provider  hydrOXYzine (ATARAX/VISTARIL) 25 MG tablet Take 1 tablet (25 mg total) by mouth 3 (three) times daily as needed for anxiety. Patient not taking: Reported on 06/03/2021 12/05/20   Armandina Stammer I, NP  lidocaine (LIDODERM) 5 % Place 1 patch onto the skin daily. Remove & Discard patch within 12 hours or as directed by MD Patient not taking: Reported on 06/03/2021 06/02/21   Loeffler, Finis Bud, PA-C  methocarbamol (ROBAXIN) 500 MG tablet Take 1 tablet (500 mg total) by mouth 2 (two)  times daily. Patient not taking: Reported on 06/03/2021 06/02/21   Loeffler, Finis Bud, PA-C  mirtazapine (REMERON SOL-TAB) 15 MG disintegrating tablet Take 1 tablet (15 mg total) by mouth at bedtime. For depression.sleep Patient not taking: Reported on 06/03/2021 12/05/20   Armandina Stammer I, NP  naproxen (NAPROSYN) 250 MG tablet Take 1 tablet (250 mg total) by mouth 2 (two) times daily with a meal. Patient not taking: Reported on 06/03/2021 12/12/20   Rasch, Victorino Dike I, NP  traMADol (ULTRAM) 50 MG tablet Take 0.5-1 tablets (25-50 mg total) by mouth every 6 (six) hours as needed for severe pain. 05/14/21   Arthor Captain, PA-C  traZODone (DESYREL) 50 MG tablet Take 1 tablet (50 mg total) by mouth at bedtime as needed for sleep. Patient not taking: Reported on 06/03/2021 12/05/20   Armandina Stammer I, NP    Allergies    Ibuprofen  Review of Systems   Review of Systems  Constitutional:  Negative for activity change, appetite change and  fever.  HENT:  Negative for congestion and rhinorrhea.   Respiratory:  Negative for cough, chest tightness and shortness of breath.   Gastrointestinal:  Positive for abdominal pain and nausea. Negative for vomiting.  Genitourinary:  Negative for dysuria and hematuria.  Musculoskeletal:  Negative for arthralgias and myalgias.  Skin:  Negative for rash.  Neurological:  Negative for dizziness, weakness and headaches.   all other systems are negative except as noted in the HPI and PMH.   Physical Exam Updated Vital Signs BP 117/73 (BP Location: Right Arm)   Pulse 60   Temp 98.9 F (37.2 C) (Oral)   Resp 20   LMP 06/12/2021   SpO2 100%   Physical Exam Vitals and nursing note reviewed.  Constitutional:      General: She is not in acute distress.    Appearance: She is well-developed.     Comments: Appears comfortable, texting on phone  HENT:     Head: Normocephalic and atraumatic.     Mouth/Throat:     Pharynx: No oropharyngeal exudate.  Eyes:     Conjunctiva/sclera: Conjunctivae normal.     Pupils: Pupils are equal, round, and reactive to light.  Neck:     Comments: No meningismus. Cardiovascular:     Rate and Rhythm: Normal rate and regular rhythm.     Heart sounds: Normal heart sounds. No murmur heard. Pulmonary:     Effort: Pulmonary effort is normal. No respiratory distress.     Breath sounds: Normal breath sounds.  Abdominal:     Palpations: Abdomen is soft.     Tenderness: There is abdominal tenderness. There is no guarding or rebound.     Comments: Diffuse generalized tenderness across abdomen, no guarding or rebound, no pain in McBurney's point.  Musculoskeletal:        General: No tenderness. Normal range of motion.     Cervical back: Normal range of motion and neck supple.  Skin:    General: Skin is warm.  Neurological:     Mental Status: She is alert and oriented to person, place, and time.     Cranial Nerves: No cranial nerve deficit.     Motor: No abnormal  muscle tone.     Coordination: Coordination normal.     Comments:  5/5 strength throughout. CN 2-12 intact.Equal grip strength.   Psychiatric:        Behavior: Behavior normal.    ED Results / Procedures / Treatments   Labs (all labs ordered  are listed, but only abnormal results are displayed) Labs Reviewed  URINALYSIS, ROUTINE W REFLEX MICROSCOPIC - Abnormal; Notable for the following components:      Result Value   APPearance HAZY (*)    Hgb urine dipstick LARGE (*)    Ketones, ur 40 (*)    All other components within normal limits  URINALYSIS, MICROSCOPIC (REFLEX) - Abnormal; Notable for the following components:   Bacteria, UA MANY (*)    All other components within normal limits  PREGNANCY, URINE    EKG None  Radiology No results found.  Procedures Procedures   Medications Ordered in ED Medications  HYDROcodone-acetaminophen (NORCO/VICODIN) 5-325 MG per tablet 1 tablet (has no administration in time range)    ED Course  I have reviewed the triage vital signs and the nursing notes.  Pertinent labs & imaging results that were available during my care of the patient were reviewed by me and considered in my medical decision making (see chart for details).    MDM Rules/Calculators/A&P                          Lower abdominal pain with nausea similar to previous episodes of painful menses.  Vital stable, no distress.  Ultrasound on September 7 was reassuring.  Patient requesting tramadol. She declines pelvic exam today.  stated she had 1 last month.  hCG negative.  Urinalysis shows blood and pyuria without obvious infection.  Pain has improved with Toradol and Norco.  She declines pelvic exam.  Low suspicion for ovarian torsion. Discussed that certain pathology can be missed without doing a pelvic exam.  She appears to have capacity to refuse.  Discussed that tramadol prescription should come from her PCP or gynecologist. Few tablets will be given to get her  through the weekend.  Follow-up with her gynecologist.  Return to the ED for worsening pain, fever, vomiting, any other concerns Final Clinical Impression(s) / ED Diagnoses Final diagnoses:  Dysmenorrhea    Rx / DC Orders ED Discharge Orders     None        Braison Snoke, Jeannett Senior, MD 06/13/21 [phone]

## 2021-06-12 NOTE — ED Triage Notes (Signed)
Pt c/o painful menses started today-hx of same-pt seen virtual visit with MD today-pt crying-to triage in w/c

## 2021-06-13 LAB — URINALYSIS, MICROSCOPIC (REFLEX): RBC / HPF: >50 RBC/hpf (ref 0–5)

## 2021-06-13 LAB — URINALYSIS, ROUTINE W REFLEX MICROSCOPIC
Bilirubin Urine: NEGATIVE
Glucose, UA: NEGATIVE mg/dL
Ketones, ur: 40 mg/dL — AB
Leukocytes,Ua: NEGATIVE
Nitrite: NEGATIVE
Protein, ur: NEGATIVE mg/dL
Specific Gravity, Urine: 1.025 (ref 1.005–1.030)
pH: 6.5 (ref 5.0–8.0)

## 2021-06-13 LAB — PREGNANCY, URINE: Preg Test, Ur: NEGATIVE

## 2021-06-13 MED ORDER — TRAMADOL HCL 50 MG PO TABS
50.0000 mg | ORAL_TABLET | Freq: Four times a day (QID) | ORAL | 0 refills | Status: DC | PRN
Start: 2021-06-13 — End: 2021-11-30

## 2021-06-13 MED ORDER — TRAMADOL HCL 50 MG PO TABS
50.0000 mg | ORAL_TABLET | Freq: Once | ORAL | Status: AC
Start: 2021-06-13 — End: 2021-06-13
  Administered 2021-06-13: 50 mg via ORAL
  Filled 2021-06-13: qty 1

## 2021-06-13 NOTE — Discharge Instructions (Addendum)
Take the pain medication as prescribed.  Follow-up with your gynecologist.  Return to the ED with new or worsening symptoms

## 2021-06-13 NOTE — ED Notes (Signed)
Pt A&OX4 ambulatory at d/c with independent steady gait 

## 2021-06-22 NOTE — Progress Notes (Signed)
Good morning,  Please call patient to alert to normal [place] results. Patient will need to schedule virtual visit with me to further discuss options for management. Please transfer her to front desk to schedule, if desired by patient, once phone call complete.  Thank you, Joni Reining

## 2021-07-08 ENCOUNTER — Telehealth: Payer: Medicaid Other | Admitting: Women's Health

## 2021-07-15 ENCOUNTER — Encounter (HOSPITAL_COMMUNITY): Payer: Self-pay

## 2021-07-15 ENCOUNTER — Other Ambulatory Visit: Payer: Self-pay

## 2021-07-15 ENCOUNTER — Emergency Department (HOSPITAL_COMMUNITY)
Admission: EM | Admit: 2021-07-15 | Discharge: 2021-07-15 | Disposition: A | Discharge status: Home / Self Care | Payer: Medicaid Other | Attending: Emergency Medicine | Admitting: Emergency Medicine

## 2021-07-15 DIAGNOSIS — R300 Dysuria: Secondary | ICD-10-CM | POA: Diagnosis not present

## 2021-07-15 DIAGNOSIS — Z5321 Procedure and treatment not carried out due to patient leaving prior to being seen by health care provider: Secondary | ICD-10-CM | POA: Insufficient documentation

## 2021-07-15 DIAGNOSIS — R350 Frequency of micturition: Secondary | ICD-10-CM | POA: Diagnosis not present

## 2021-07-15 LAB — URINALYSIS, ROUTINE W REFLEX MICROSCOPIC
Bilirubin Urine: NEGATIVE
Glucose, UA: NEGATIVE mg/dL
Hgb urine dipstick: NEGATIVE
Ketones, ur: 5 mg/dL — AB
Nitrite: NEGATIVE
Protein, ur: NEGATIVE mg/dL
Specific Gravity, Urine: 1.031 — ABNORMAL HIGH (ref 1.005–1.030)
pH: 5 (ref 5.0–8.0)

## 2021-07-15 LAB — I-STAT BETA HCG BLOOD, ED (MC, WL, AP ONLY): I-stat hCG, quantitative: <5 m[IU]/mL (ref <5)

## 2021-07-15 NOTE — ED Notes (Signed)
Patient stated that she needed to leave because she was late for something, and walked out the front door.

## 2021-07-15 NOTE — ED Provider Notes (Signed)
Emergency Medicine Provider Triage Evaluation Note  Suzanne Garcia , a 20 y.o. female  was evaluated in triage.  Pt complains of dysuria, urinary frequency, and missed menstrual cycle.  Last menses in August 2022.  Review of Systems  Positive: Dysuria and urinary frequency, possible pregnancy Negative: No fever, chills, vomiting or dizziness.  Physical Exam  BP 113/78 (BP Location: Left Arm)   Pulse 72   Temp 98.4 F (36.9 C) (Oral)   Resp 14   Ht 4\' 10"  (1.473 m)   Wt 50.8 kg   LMP 05/13/2021 Comment: Irregular periods  SpO2 99%   BMI 23.41 kg/m  Gen:   Awake, no distress   Resp:  Normal effort  MSK:   Moves extremities without difficulty  Other:  Nontoxic appearance  Medical Decision Making  Medically screening exam initiated at 10:50 AM.  Appropriate orders placed.  Suzanne Garcia was informed that the remainder of the evaluation will be completed by another provider, this initial triage assessment does not replace that evaluation, and the importance of remaining in the ED until their evaluation is complete.  Screening for UTI and pregnancy   Huey Romans, MD 07/15/21 1051

## 2021-07-15 NOTE — ED Triage Notes (Signed)
Patient c/o dysuria and urinary frequency x 11 week.

## 2021-08-06 ENCOUNTER — Encounter (HOSPITAL_COMMUNITY): Payer: Self-pay | Admitting: Emergency Medicine

## 2021-08-06 ENCOUNTER — Ambulatory Visit (HOSPITAL_COMMUNITY)
Admission: EM | Admit: 2021-08-06 | Discharge: 2021-08-06 | Disposition: A | Discharge status: Home / Self Care | Payer: Medicaid Other | Attending: Emergency Medicine | Admitting: Emergency Medicine

## 2021-08-06 ENCOUNTER — Other Ambulatory Visit: Payer: Self-pay

## 2021-08-06 DIAGNOSIS — R350 Frequency of micturition: Secondary | ICD-10-CM | POA: Diagnosis not present

## 2021-08-06 DIAGNOSIS — R300 Dysuria: Secondary | ICD-10-CM | POA: Diagnosis not present

## 2021-08-06 LAB — POCT URINALYSIS DIPSTICK, ED / UC
Bilirubin Urine: NEGATIVE
Glucose, UA: NEGATIVE mg/dL
Hgb urine dipstick: NEGATIVE
Ketones, ur: NEGATIVE mg/dL
Leukocytes,Ua: NEGATIVE
Nitrite: NEGATIVE
Protein, ur: NEGATIVE mg/dL
Specific Gravity, Urine: 1.025 (ref 1.005–1.030)
Urobilinogen, UA: 0.2 mg/dL (ref 0.0–1.0)
pH: 5.5 (ref 5.0–8.0)

## 2021-08-06 LAB — POC URINE PREG, ED: Preg Test, Ur: NEGATIVE

## 2021-08-06 NOTE — ED Triage Notes (Signed)
For 2 weeks has noticed irritation with urination and odor to urine and frequency.  Patient reports history of uti.

## 2021-08-06 NOTE — Discharge Instructions (Signed)
We will contact you if the results from your lab work are positive and require additional treatment.    You can take Tylenol and/or Ibuprofen as needed for pain relief and fever reduction.  Make sure you are drinking plenty of fluids, especially water.   Do not have sex while taking undergoing treatment for STI.  Make sure that all of your partners get tested and treated.  Use a condom or other barrier method for all sexual encounters.    Return or go to the Emergency Department if symptoms worsen or do not improve in the next few days.

## 2021-08-06 NOTE — ED Notes (Signed)
Labeled cervicovaginal swab placed in lab

## 2021-08-06 NOTE — ED Notes (Signed)
Patient sent to bathroom with labeled cup and instructions

## 2021-08-06 NOTE — ED Provider Notes (Signed)
[facility]    CSN: [id number] Arrival date & time: 08/06/21  [id number]      History   Chief Complaint Chief Complaint  Patient presents with   Urinary Tract Infection    HPI Suzanne Garcia is a 20 y.o. female.   Patient here for evaluation of urinary frequency, dysuria, and odor that has been ongoing for the past 2 weeks.  Reports having a history of UTI.  Denies any discharge.  Has not taken any OTC medications or treatments.  Denies any trauma, injury, or other precipitating event.  Denies any specific alleviating or aggravating factors.  Denies any fevers, chest pain, shortness of breath, N/V/D, numbness, tingling, weakness, abdominal pain, or headaches.    The history is provided by the patient.  Urinary Tract Infection Associated symptoms: no vaginal discharge    Past Medical History:  Diagnosis Date   Anxiety    Bipolar disorder [facility](HCC)    Depression     Patient Active Problem List   Diagnosis Date Noted   Suicidal ideation 12/02/2020   MDD (major depressive disorder), recurrent severe, without psychosis (HCC) 12/01/2020   ADHD 06/04/2018   Short stature 06/24/2015   Failed vision screen 06/24/2015    Past Surgical History:  Procedure Laterality Date   NO PAST SURGERIES      OB History     Gravida  1   Para  0   Term  0   Preterm  0   AB  1   Living  0      SAB  1   IAB  0   Ectopic  0   Multiple  0   Live Births  0            Home Medications    Prior to Admission medications   Medication Sig Start Date End Date Taking? Authorizing Provider  hydrOXYzine (ATARAX/VISTARIL) 25 MG tablet Take 1 tablet (25 mg total) by mouth 3 (three) times daily as needed for anxiety. Patient not taking: Reported on 06/03/2021 12/05/20   Armandina StammerNwoko, Agnes I, NP  lidocaine (LIDODERM) 5 % Place 1 patch onto the skin daily. Remove & Discard patch within 12 hours or as directed by MD Patient not taking: Reported on 06/03/2021 06/02/21   Loeffler, Finis BudGrace  C, PA-C  methocarbamol (ROBAXIN) 500 MG tablet Take 1 tablet (500 mg total) by mouth 2 (two) times daily. Patient not taking: Reported on 06/03/2021 06/02/21   Loeffler, Finis BudGrace C, PA-C  mirtazapine (REMERON SOL-TAB) 15 MG disintegrating tablet Take 1 tablet (15 mg total) by mouth at bedtime. For depression.sleep Patient not taking: Reported on 06/03/2021 12/05/20   Armandina StammerNwoko, Agnes I, NP  naproxen (NAPROSYN) 250 MG tablet Take 1 tablet (250 mg total) by mouth 2 (two) times daily with a meal. Patient not taking: Reported on 06/03/2021 12/12/20   Rasch, Victorino DikeJennifer I, NP  traMADol (ULTRAM) 50 MG tablet Take 1 tablet (50 mg total) by mouth every 6 (six) hours as needed. 06/13/21   Rancour, Jeannett SeniorStephen, MD  traZODone (DESYREL) 50 MG tablet Take 1 tablet (50 mg total) by mouth at bedtime as needed for sleep. Patient not taking: Reported on 06/03/2021 12/05/20   Sanjuana KavaNwoko, Agnes I, NP    Family History Family History  Problem Relation Age of Onset   Obesity Mother    Asthma Mother    Miscarriages / [place] Mother    Diabetes Mother    Depression Mother    Mental illness Father  ADD / ADHD Sister    Bipolar disorder Sister    Heart disease Maternal Grandmother    Diabetes Maternal Grandmother    Bipolar disorder Maternal Grandmother    Bipolar disorder Maternal Grandfather    Multiple sclerosis Maternal Grandfather     Social History Social History   Tobacco Use   Smoking status: Never   Smokeless tobacco: Never  Vaping Use   Vaping Use: Never used  Substance Use Topics   Alcohol use: Not Currently   Drug use: Not Currently     Allergies   Ibuprofen   Review of Systems Review of Systems  Genitourinary:  Positive for dysuria and frequency. Negative for urgency, vaginal bleeding and vaginal discharge.  All other systems reviewed and are negative.   Physical Exam Triage Vital Signs ED Triage Vitals  Enc Vitals Group     BP 08/06/21 0913 115/76     Pulse Rate 08/06/21 0913 69     Resp  08/06/21 0913 16     Temp 08/06/21 0913 98.3 F (36.8 C)     Temp Source 08/06/21 0913 Oral     SpO2 08/06/21 0913 100 %     Weight --      Height --      Head Circumference --      Peak Flow --      Pain Score 08/06/21 0910 6     Pain Loc --      Pain Edu? --      Excl. in GC? --    No data found.  Updated Vital Signs BP 115/76 (BP Location: Left Arm) Comment (BP Location): small cuff  Pulse 69   Temp 98.3 F (36.8 C) (Oral)   Resp 16   LMP 07/13/2021   SpO2 100%   Visual Acuity Right Eye Distance:   Left Eye Distance:   Bilateral Distance:    Right Eye Near:   Left Eye Near:    Bilateral Near:     Physical Exam Vitals and nursing note reviewed.  Constitutional:      General: She is not in acute distress.    Appearance: Normal appearance. She is not ill-appearing, toxic-appearing or diaphoretic.  HENT:     Head: Normocephalic and atraumatic.  Eyes:     Conjunctiva/sclera: Conjunctivae normal.  Cardiovascular:     Rate and Rhythm: Normal rate.     Pulses: Normal pulses.     Heart sounds: Normal heart sounds.  Pulmonary:     Effort: Pulmonary effort is normal.     Breath sounds: Normal breath sounds.  Abdominal:     General: Abdomen is flat.     Tenderness: There is no right CVA tenderness or left CVA tenderness.  Musculoskeletal:        General: Normal range of motion.     Cervical back: Normal range of motion.  Skin:    General: Skin is warm and dry.  Neurological:     General: No focal deficit present.     Mental Status: She is alert and oriented to person, place, and time.  Psychiatric:        Mood and Affect: Mood normal.     UC Treatments / Results  Labs (all labs ordered are listed, but only abnormal results are displayed) Labs Reviewed  POCT URINALYSIS DIPSTICK, ED / UC  POC URINE PREG, ED  CERVICOVAGINAL ANCILLARY ONLY    EKG   Radiology No results found.  Procedures Procedures (including critical care time)  Medications  Ordered in UC Medications - No data to display  Initial Impression / Assessment and Plan / UC Course  I have reviewed the triage vital signs and the nursing notes.  Pertinent labs & imaging results that were available during my care of the patient were reviewed by me and considered in my medical decision making (see chart for details).    Assessment negative for red flags or concerns.  Urinalysis within normal limits with no signs of infection.  Pregnancy test negative.  Self swab obtained and will treat based on results.  Discussed safe sex practices including condoms or other barrier method use.  Patient instructed to abstain from sexual activity while awaiting for test results.  May take Tylenol and or ibuprofen as needed.  Encourage fluids.  Follow-up as needed Final Clinical Impressions(s) / UC Diagnoses   Final diagnoses:  Dysuria  Urinary frequency     Discharge Instructions      We will contact you if the results from your lab work are positive and require additional treatment.    You can take Tylenol and/or Ibuprofen as needed for pain relief and fever reduction.  Make sure you are drinking plenty of fluids, especially water.   Do not have sex while taking undergoing treatment for STI.  Make sure that all of your partners get tested and treated.  Use a condom or other barrier method for all sexual encounters.    Return or go to the Emergency Department if symptoms worsen or do not improve in the next few days.       ED Prescriptions   None    PDMP not reviewed this encounter.   Ivette Loyal, NP 08/06/21 [phone]

## 2021-08-07 LAB — CERVICOVAGINAL ANCILLARY ONLY
Bacterial Vaginitis (gardnerella): NEGATIVE
Candida Glabrata: NEGATIVE
Candida Vaginitis: NEGATIVE
Chlamydia: NEGATIVE
Comment: NEGATIVE
Comment: NEGATIVE
Comment: NEGATIVE
Comment: NEGATIVE
Comment: NEGATIVE
Comment: NORMAL
Neisseria Gonorrhea: NEGATIVE
Trichomonas: NEGATIVE

## 2021-08-24 ENCOUNTER — Emergency Department (HOSPITAL_BASED_OUTPATIENT_CLINIC_OR_DEPARTMENT_OTHER)
Admission: EM | Admit: 2021-08-24 | Discharge: 2021-08-24 | Disposition: A | Discharge status: Home / Self Care | Payer: Medicaid Other | Attending: Student | Admitting: Student

## 2021-08-24 ENCOUNTER — Encounter (HOSPITAL_BASED_OUTPATIENT_CLINIC_OR_DEPARTMENT_OTHER): Payer: Self-pay

## 2021-08-24 ENCOUNTER — Other Ambulatory Visit: Payer: Self-pay

## 2021-08-24 DIAGNOSIS — R1031 Right lower quadrant pain: Secondary | ICD-10-CM | POA: Diagnosis present

## 2021-08-24 DIAGNOSIS — N946 Dysmenorrhea, unspecified: Secondary | ICD-10-CM | POA: Diagnosis not present

## 2021-08-24 LAB — URINALYSIS, ROUTINE W REFLEX MICROSCOPIC
Bilirubin Urine: NEGATIVE
Glucose, UA: NEGATIVE mg/dL
Ketones, ur: 40 mg/dL — AB
Leukocytes,Ua: NEGATIVE
Nitrite: NEGATIVE
Protein, ur: NEGATIVE mg/dL
Specific Gravity, Urine: >1.03 — ABNORMAL HIGH (ref 1.005–1.030)
pH: 6.5 (ref 5.0–8.0)

## 2021-08-24 LAB — URINALYSIS, MICROSCOPIC (REFLEX): RBC / HPF: >50 RBC/hpf (ref 0–5)

## 2021-08-24 MED ORDER — KETOROLAC TROMETHAMINE 15 MG/ML IJ SOLN
15.0000 mg | Freq: Once | INTRAMUSCULAR | Status: AC
  Administered 2021-08-24: 15 mg via INTRAMUSCULAR
  Filled 2021-08-24: qty 1

## 2021-08-24 NOTE — ED Provider Notes (Signed)
[facility] [place] [facility] Provider Note   CSN: [id number] Arrival date & time: 08/24/21  2015     History Chief Complaint  Patient presents with   Dysmenorrhea    Suzanne Garcia is a 20 y.o. female with PMH anxiety, bipolar disorder, multiple previous emergency department evaluations for dysmenorrhea who presents the emergency department for dysmenorrhea.  She states she has had lower pelvic cramping and abdominal pain in the setting of starting her period today.  Patient was previously placed on tramadol by a previous provider and this has not been relieving her pain.  Patient is not on birth control or any cycle regulating medication.  She had an ultrasound performed by OB/GYN 1 month ago that was reassuringly negative.  Denies chest pain, shortness of breath, Donnell pain, nausea, vomiting or other systemic symptoms.  HPI     Past Medical History:  Diagnosis Date   Anxiety    Bipolar disorder [facility])    Depression     Patient Active Problem List   Diagnosis Date Noted   Suicidal ideation 12/02/2020   MDD (major depressive disorder), recurrent severe, without psychosis ([place]) 12/01/2020   ADHD 06/04/2018   Short stature 06/24/2015   Failed vision screen 06/24/2015    Past Surgical History:  Procedure Laterality Date   NO PAST SURGERIES       OB History     Gravida  1   Para  0   Term  0   Preterm  0   AB  1   Living  0      SAB  1   IAB  0   Ectopic  0   Multiple  0   Live Births  0           Family History  Problem Relation Age of Onset   Obesity Mother    Asthma Mother    Miscarriages / [place] Mother    Diabetes Mother    Depression Mother    Mental illness Father    ADD / ADHD Sister    Bipolar disorder Sister    Heart disease Maternal Grandmother    Diabetes Maternal Grandmother    Bipolar disorder Maternal Grandmother    Bipolar disorder Maternal Grandfather    Multiple sclerosis Maternal Grandfather      Social History   Tobacco Use   Smoking status: Never   Smokeless tobacco: Never  Vaping Use   Vaping Use: Never used  Substance Use Topics   Alcohol use: Not Currently   Drug use: Not Currently    Home Medications Prior to Admission medications   Medication Sig Start Date End Date Taking? Authorizing Provider  hydrOXYzine (ATARAX/VISTARIL) 25 MG tablet Take 1 tablet (25 mg total) by mouth 3 (three) times daily as needed for anxiety. Patient not taking: Reported on 06/03/2021 12/05/20   Lindell Spar I, NP  lidocaine (LIDODERM) 5 % Place 1 patch onto the skin daily. Remove & Discard patch within 12 hours or as directed by MD Patient not taking: Reported on 06/03/2021 06/02/21   Loeffler, Adora Fridge, PA-C  methocarbamol (ROBAXIN) 500 MG tablet Take 1 tablet (500 mg total) by mouth 2 (two) times daily. Patient not taking: Reported on 06/03/2021 06/02/21   Loeffler, Adora Fridge, PA-C  mirtazapine (REMERON SOL-TAB) 15 MG disintegrating tablet Take 1 tablet (15 mg total) by mouth at bedtime. For depression.sleep Patient not taking: Reported on 06/03/2021 12/05/20   Lindell Spar I, NP  naproxen (NAPROSYN) 250  MG tablet Take 1 tablet (250 mg total) by mouth 2 (two) times daily with a meal. Patient not taking: Reported on 06/03/2021 12/12/20   Rasch, Victorino Dike I, NP  traMADol (ULTRAM) 50 MG tablet Take 1 tablet (50 mg total) by mouth every 6 (six) hours as needed. 06/13/21   Rancour, Jeannett Senior, MD  traZODone (DESYREL) 50 MG tablet Take 1 tablet (50 mg total) by mouth at bedtime as needed for sleep. Patient not taking: Reported on 06/03/2021 12/05/20   Armandina Stammer I, NP    Allergies    Ibuprofen  Review of Systems   Review of Systems  Constitutional:  Negative for chills and fever.  HENT:  Negative for ear pain and sore throat.   Eyes:  Negative for pain and visual disturbance.  Respiratory:  Negative for cough and shortness of breath.   Cardiovascular:  Negative for chest pain and palpitations.   Gastrointestinal:  Positive for abdominal pain. Negative for vomiting.  Genitourinary:  Negative for dysuria and hematuria.  Musculoskeletal:  Negative for arthralgias and back pain.  Skin:  Negative for color change and rash.  Neurological:  Negative for seizures and syncope.  All other systems reviewed and are negative.  Physical Exam Updated Vital Signs BP 109/71   Pulse 69   Temp 98 F (36.7 C) (Oral)   Resp 16   Ht 4\' 10"  (1.473 m)   Wt 50.8 kg   LMP 08/24/2021 (Exact Date)   SpO2 99%   BMI 23.41 kg/m   Physical Exam Vitals and nursing note reviewed.  Constitutional:      General: She is not in acute distress.    Appearance: She is well-developed.  HENT:     Head: Normocephalic and atraumatic.  Eyes:     Conjunctiva/sclera: Conjunctivae normal.  Cardiovascular:     Rate and Rhythm: Normal rate and regular rhythm.     Heart sounds: No murmur heard. Pulmonary:     Effort: Pulmonary effort is normal. No respiratory distress.     Breath sounds: Normal breath sounds.  Abdominal:     Palpations: Abdomen is soft.     Tenderness: There is abdominal tenderness (Right lower quadrant, left lower quadrant).  Musculoskeletal:        General: No swelling.     Cervical back: Neck supple.  Skin:    General: Skin is warm and dry.     Capillary Refill: Capillary refill takes less than 2 seconds.  Neurological:     Mental Status: She is alert.  Psychiatric:        Mood and Affect: Mood normal.    ED Results / Procedures / Treatments   Labs (all labs ordered are listed, but only abnormal results are displayed) Labs Reviewed  URINALYSIS, ROUTINE W REFLEX MICROSCOPIC - Abnormal; Notable for the following components:      Result Value   APPearance HAZY (*)    Specific Gravity, Urine >1.030 (*)    Hgb urine dipstick LARGE (*)    Ketones, ur 40 (*)    All other components within normal limits  URINALYSIS, MICROSCOPIC (REFLEX) - Abnormal; Notable for the following  components:   Bacteria, UA RARE (*)    All other components within normal limits    EKG None  Radiology No results found.  Procedures Procedures   Medications Ordered in ED Medications  ketorolac (TORADOL) 15 MG/ML injection 15 mg (15 mg Intramuscular Given 08/24/21 2144)    ED Course  I have reviewed the triage  vital signs and the nursing notes.  Pertinent labs & imaging results that were available during my care of the patient were reviewed by me and considered in my medical decision making (see chart for details).    MDM Rules/Calculators/A&P                           Patient seen the emergency department for evaluation of dysmenorrhea.  Physical exam reveals tenderness in the right and left lower quadrants.  Urinalysis with large blood consistent with her current menstrual period.  She was given a single 50 mg injection of IM Toradol and her symptoms resolved.  I suspect the patient may have developing endometriosis and given her multiple presentations to the emergency department for dysmenorrhea, she may benefit from a OB/GYN lead cycle control with IUD versus Depo-Provera.  She was instructed to call her OB/GYN tomorrow to set up appointment to discuss this.   Final Clinical Impression(s) / ED Diagnoses Final diagnoses:  Dysmenorrhea    Rx / DC Orders ED Discharge Orders     None        Jamonta Goerner, MD 08/24/21 2349

## 2021-08-24 NOTE — ED Triage Notes (Signed)
Pt started menstrual cycle today and having "bad menstrual cramps". Took tramadol without relief. C/o N/V

## 2021-08-26 ENCOUNTER — Telehealth: Payer: Self-pay | Admitting: *Deleted

## 2021-08-26 NOTE — Telephone Encounter (Signed)
Pt called to office asking about contraception.  Attempt to return call.  LVM asking pt to return call.

## 2021-09-11 ENCOUNTER — Ambulatory Visit: Payer: Medicaid Other | Admitting: Obstetrics and Gynecology

## 2021-09-13 ENCOUNTER — Encounter (HOSPITAL_BASED_OUTPATIENT_CLINIC_OR_DEPARTMENT_OTHER): Payer: Self-pay | Admitting: Emergency Medicine

## 2021-09-13 ENCOUNTER — Emergency Department (HOSPITAL_BASED_OUTPATIENT_CLINIC_OR_DEPARTMENT_OTHER)
Admission: EM | Admit: 2021-09-13 | Discharge: 2021-09-13 | Disposition: A | Discharge status: Home / Self Care | Payer: Medicaid Other | Attending: Emergency Medicine | Admitting: Emergency Medicine

## 2021-09-13 ENCOUNTER — Other Ambulatory Visit: Payer: Self-pay

## 2021-09-13 DIAGNOSIS — H5789 Other specified disorders of eye and adnexa: Secondary | ICD-10-CM | POA: Insufficient documentation

## 2021-09-13 DIAGNOSIS — R059 Cough, unspecified: Secondary | ICD-10-CM | POA: Diagnosis present

## 2021-09-13 DIAGNOSIS — J069 Acute upper respiratory infection, unspecified: Secondary | ICD-10-CM | POA: Diagnosis not present

## 2021-09-13 MED ORDER — BENZONATATE 100 MG PO CAPS: 100.0000 mg | ORAL_CAPSULE | Freq: Three times a day (TID) | ORAL | 0 refills | Status: DC

## 2021-09-13 NOTE — ED Triage Notes (Signed)
Pt reports chills, sore throat, cough, runny nose, H/A, and eye redness/drainage for the past week.

## 2021-09-13 NOTE — Discharge Instructions (Signed)
Take tylenol 2 pills 4 times a day and motrin 4 pills 3 times a day.  Drink plenty of fluids.  Return for worsening shortness of breath, headache, confusion. Follow up with your family doctor.   You can try Sudafed, the medicine that you have to show your driver's license and sign your name.  This works very well but will raise your blood pressure and make your heart race.  You can also try Afrin which can have similar side effects but works right at the source and so may have less heart racing.  If you use Afrin, it can become addictive, so it is only recommended for 3 days.  

## 2021-09-13 NOTE — ED Notes (Signed)
ED Provider at bedside. 

## 2021-09-13 NOTE — ED Provider Notes (Signed)
[place] [facility] Provider Note   CSN: [id number] Arrival date & time: 09/13/21  [id number]     History Chief Complaint  Patient presents with   Eye Drainage   Cough    Suzanne Garcia is a 20 y.o. female.  20 yo F with cough congestion headache sore throat eye drainage going on for about a week now.  No known sick contacts no issues eating and drinking no abdominal discomfort.  The history is provided by the patient.  Cough Associated symptoms: chills, eye discharge, fever and headaches   Associated symptoms: no chest pain, no myalgias, no rhinorrhea, no shortness of breath and no wheezing   Illness Severity:  Mild Onset quality:  Gradual Duration:  1 week Timing:  Constant Progression:  Unchanged Chronicity:  New Associated symptoms: congestion, cough, fever and headaches   Associated symptoms: no chest pain, no myalgias, no nausea, no rhinorrhea, no shortness of breath, no vomiting and no wheezing       Past Medical History:  Diagnosis Date   Anxiety    Bipolar disorder [facility])    Depression     Patient Active Problem List   Diagnosis Date Noted   Suicidal ideation 12/02/2020   MDD (major depressive disorder), recurrent severe, without psychosis ([place]) 12/01/2020   ADHD 06/04/2018   Short stature 06/24/2015   Failed vision screen 06/24/2015    Past Surgical History:  Procedure Laterality Date   NO PAST SURGERIES       OB History     Gravida  1   Para  0   Term  0   Preterm  0   AB  1   Living  0      SAB  1   IAB  0   Ectopic  0   Multiple  0   Live Births  0           Family History  Problem Relation Age of Onset   Obesity Mother    Asthma Mother    Miscarriages / [place] Mother    Diabetes Mother    Depression Mother    Mental illness Father    ADD / ADHD Sister    Bipolar disorder Sister    Heart disease Maternal Grandmother    Diabetes Maternal Grandmother    Bipolar disorder Maternal  Grandmother    Bipolar disorder Maternal Grandfather    Multiple sclerosis Maternal Grandfather     Social History   Tobacco Use   Smoking status: Never   Smokeless tobacco: Never  Vaping Use   Vaping Use: Never used  Substance Use Topics   Alcohol use: Not Currently   Drug use: Not Currently    Home Medications Prior to Admission medications   Medication Sig Start Date End Date Taking? Authorizing Provider  benzonatate (TESSALON) 100 MG capsule Take 1 capsule (100 mg total) by mouth every 8 (eight) hours. 09/13/21  Yes Deno Etienne, DO  hydrOXYzine (ATARAX/VISTARIL) 25 MG tablet Take 1 tablet (25 mg total) by mouth 3 (three) times daily as needed for anxiety. Patient not taking: Reported on 06/03/2021 12/05/20   Lindell Spar I, NP  lidocaine (LIDODERM) 5 % Place 1 patch onto the skin daily. Remove & Discard patch within 12 hours or as directed by MD Patient not taking: Reported on 06/03/2021 06/02/21   Loeffler, Adora Fridge, PA-C  methocarbamol (ROBAXIN) 500 MG tablet Take 1 tablet (500 mg total) by mouth 2 (two) times daily. Patient not  taking: Reported on 06/03/2021 06/02/21   Loeffler, Finis Bud, PA-C  mirtazapine (REMERON SOL-TAB) 15 MG disintegrating tablet Take 1 tablet (15 mg total) by mouth at bedtime. For depression.sleep Patient not taking: Reported on 06/03/2021 12/05/20   Armandina Stammer I, NP  naproxen (NAPROSYN) 250 MG tablet Take 1 tablet (250 mg total) by mouth 2 (two) times daily with a meal. Patient not taking: Reported on 06/03/2021 12/12/20   Rasch, Victorino Dike I, NP  traMADol (ULTRAM) 50 MG tablet Take 1 tablet (50 mg total) by mouth every 6 (six) hours as needed. 06/13/21   Rancour, Jeannett Senior, MD  traZODone (DESYREL) 50 MG tablet Take 1 tablet (50 mg total) by mouth at bedtime as needed for sleep. Patient not taking: Reported on 06/03/2021 12/05/20   Armandina Stammer I, NP    Allergies    Ibuprofen  Review of Systems   Review of Systems  Constitutional:  Positive for chills and fever.   HENT:  Positive for congestion. Negative for rhinorrhea.   Eyes:  Positive for discharge. Negative for redness and visual disturbance.  Respiratory:  Positive for cough. Negative for shortness of breath and wheezing.   Cardiovascular:  Negative for chest pain and palpitations.  Gastrointestinal:  Negative for nausea and vomiting.  Genitourinary:  Negative for dysuria and urgency.  Musculoskeletal:  Negative for arthralgias and myalgias.  Skin:  Negative for pallor and wound.  Neurological:  Positive for headaches. Negative for dizziness.   Physical Exam Updated Vital Signs BP 109/70   Pulse 95   Temp 98.5 F (36.9 C) (Oral)   Resp 20   LMP 08/24/2021 (Exact Date)   SpO2 100%   Physical Exam Vitals and nursing note reviewed.  Constitutional:      General: She is not in acute distress.    Appearance: She is well-developed. She is not diaphoretic.  HENT:     Head: Normocephalic and atraumatic.     Ears:     Comments: Swollen turbinates, posterior nasal drip, no noted sinus ttp, tm normal bilaterally.   Eyes:     Pupils: Pupils are equal, round, and reactive to light.  Cardiovascular:     Rate and Rhythm: Normal rate and regular rhythm.     Heart sounds: No murmur heard.   No friction rub. No gallop.  Pulmonary:     Effort: Pulmonary effort is normal.     Breath sounds: No wheezing or rales.  Abdominal:     General: There is no distension.     Palpations: Abdomen is soft.     Tenderness: There is no abdominal tenderness.  Musculoskeletal:        General: No tenderness.     Cervical back: Normal range of motion and neck supple.  Skin:    General: Skin is warm and dry.  Neurological:     Mental Status: She is alert and oriented to person, place, and time.  Psychiatric:        Behavior: Behavior normal.    ED Results / Procedures / Treatments   Labs (all labs ordered are listed, but only abnormal results are displayed) Labs Reviewed - No data to  display  EKG None  Radiology No results found.  Procedures Procedures   Medications Ordered in ED Medications - No data to display  ED Course  I have reviewed the triage vital signs and the nursing notes.  Pertinent labs & imaging results that were available during my care of the patient were reviewed by me  and considered in my medical decision making (see chart for details).    MDM Rules/Calculators/A&P                           20 yo F with a cc of cough, congestion, fevers, chills, going on for a week.  She is well-appearing nontoxic has no tachypnea no tachycardia benign exam for bacterial source.  Likely viral syndrome.  Will discharge home.  PCP follow-up.  8:57 AM:  I have discussed the diagnosis/risks/treatment options with the patient and family and believe the pt to be eligible for discharge home to follow-up with PCP. We also discussed returning to the ED immediately if new or worsening sx occur. We discussed the sx which are most concerning (e.g., sudden worsening pain, fever, inability to tolerate by mouth) that necessitate immediate return. Medications administered to the patient during their visit and any new prescriptions provided to the patient are listed below.  Medications given during this visit Medications - No data to display   The patient appears reasonably screen and/or stabilized for discharge and I doubt any other medical condition or other [facility] requiring further screening, evaluation, or treatment in the ED at this time prior to discharge.   Final Clinical Impression(s) / ED Diagnoses Final diagnoses:  Viral URI with cough    Rx / DC Orders ED Discharge Orders          Ordered    benzonatate (TESSALON) 100 MG capsule  Every 8 hours        09/13/21 [place], [place], DO 09/13/21 [phone]

## 2021-09-21 ENCOUNTER — Ambulatory Visit (INDEPENDENT_AMBULATORY_CARE_PROVIDER_SITE_OTHER): Payer: Medicaid Other | Admitting: Nurse Practitioner

## 2021-09-21 ENCOUNTER — Other Ambulatory Visit: Payer: Self-pay

## 2021-09-21 ENCOUNTER — Encounter: Payer: Self-pay | Admitting: Nurse Practitioner

## 2021-09-21 ENCOUNTER — Other Ambulatory Visit (HOSPITAL_COMMUNITY)
Admission: RE | Admit: 2021-09-21 | Discharge: 2021-09-21 | Disposition: A | Discharge status: Home / Self Care | Payer: Medicaid Other | Source: Ambulatory Visit | Attending: Nurse Practitioner | Admitting: Nurse Practitioner

## 2021-09-21 VITALS — BP 98/62 | HR 78 | Ht <= 58 in | Wt 112.0 lb

## 2021-09-21 DIAGNOSIS — F332 Major depressive disorder, recurrent severe without psychotic features: Secondary | ICD-10-CM

## 2021-09-21 DIAGNOSIS — N946 Dysmenorrhea, unspecified: Secondary | ICD-10-CM

## 2021-09-21 DIAGNOSIS — Z01419 Encounter for gynecological examination (general) (routine) without abnormal findings: Secondary | ICD-10-CM | POA: Insufficient documentation

## 2021-09-21 DIAGNOSIS — Z30013 Encounter for initial prescription of injectable contraceptive: Secondary | ICD-10-CM | POA: Diagnosis not present

## 2021-09-21 MED ORDER — MEDROXYPROGESTERONE ACETATE 150 MG/ML IM SUSP
150.0000 mg | Freq: Once | INTRAMUSCULAR | Status: AC
  Administered 2021-09-21: 17:00:00 150 mg via INTRAMUSCULAR

## 2021-09-21 MED ORDER — MEDROXYPROGESTERONE ACETATE 150 MG/ML IM SUSP: 150.0000 mg | INTRAMUSCULAR | 4 refills | Status: DC

## 2021-09-21 NOTE — Progress Notes (Addendum)
GYN presents for [facility] Consult, she wants to know about DEPO.  First PAP is due. LMP 08/24/2021. Last unprotected sex was 09/11/2021. Patient is requesting refills on HydrOXYzine, TraZoDone, Mirtazapine.  Ok per Provider to give DEPO. UPT today is NEGATIVE. DEPO Injection given in RUOQ, tolerated well. Next DEPO due March 6-20, 2023  Administrations This Visit     medroxyPROGESTERone (DEPO-PROVERA) injection 150 mg     Admin Date 09/21/2021 Action Given Dose 150 mg Route Intramuscular Administered By Maretta Bees, RMA

## 2021-09-21 NOTE — Progress Notes (Signed)
GYNECOLOGY ANNUAL PREVENTATIVE CARE ENCOUNTER NOTE  Subjective:   Suzanne Garcia is a 20 y.o. [id number] female here for a routine annual gynecologic exam.  Current complaints: dysmenorrhea and wants psych meds refilled.   Took medication in Feb for one month and stopped.  Has not had any psych meds since March and is not seeing any Behavioral Health provider.  Advised she would need additional evaluation before prescribing medication today.  Denies abnormal vaginal bleeding, discharge, pelvic pain, problems with intercourse or other gynecologic concerns.    Gynecologic History Patient's last menstrual period was 08/24/2021 (exact date). Contraception: none Last Pap: NA.   Obstetric History OB History  Gravida Para Term Preterm AB Living  1 0 0 0 1 0  SAB IAB Ectopic Multiple Live Births  1 0 0 0 0    # Outcome Date GA Lbr Len/2nd Weight Sex Delivery Anes PTL Lv  1 SAB 03/2020 [redacted]w[redacted]d           Past Medical History:  Diagnosis Date   Anxiety    Bipolar disorder (HCC)    Depression     Past Surgical History:  Procedure Laterality Date   NO PAST SURGERIES      Current Outpatient Medications on File Prior to Visit  Medication Sig Dispense Refill   benzonatate (TESSALON) 100 MG capsule Take 1 capsule (100 mg total) by mouth every 8 (eight) hours. 21 capsule 0   hydrOXYzine (ATARAX/VISTARIL) 25 MG tablet Take 1 tablet (25 mg total) by mouth 3 (three) times daily as needed for anxiety. (Patient not taking: Reported on 06/03/2021) 75 tablet 0   lidocaine (LIDODERM) 5 % Place 1 patch onto the skin daily. Remove & Discard patch within 12 hours or as directed by MD (Patient not taking: Reported on 06/03/2021) 30 patch 0   methocarbamol (ROBAXIN) 500 MG tablet Take 1 tablet (500 mg total) by mouth 2 (two) times daily. (Patient not taking: Reported on 06/03/2021) 20 tablet 0   mirtazapine (REMERON SOL-TAB) 15 MG disintegrating tablet Take 1 tablet (15 mg total) by mouth at bedtime. For  depression.sleep (Patient not taking: Reported on 06/03/2021) 30 tablet 0   traMADol (ULTRAM) 50 MG tablet Take 1 tablet (50 mg total) by mouth every 6 (six) hours as needed. 15 tablet 0   traZODone (DESYREL) 50 MG tablet Take 1 tablet (50 mg total) by mouth at bedtime as needed for sleep. (Patient not taking: Reported on 06/03/2021) 30 tablet 0   No current facility-administered medications on file prior to visit.    Allergies  Allergen Reactions   Ibuprofen     Patient states that it causes "stomach thinning"     Social History   Socioeconomic History   Marital status: Single    Spouse name: Not on file   Number of children: Not on file   Years of education: Not on file   Highest education level: Not on file  Occupational History   Not on file  Tobacco Use   Smoking status: Never   Smokeless tobacco: Never  Vaping Use   Vaping Use: Never used  Substance and Sexual Activity   Alcohol use: Not Currently   Drug use: Not Currently   Sexual activity: Not on file  Other Topics Concern   Not on file  Social History Narrative   Born at [facility] in [place]. Jaundice in NBN.      Social Determinants of Health   Financial Resource Strain: Not on file  Food Insecurity: Not on file  Transportation Needs: Not on file  Physical Activity: Not on file  Stress: Not on file  Social Connections: Not on file  Intimate Partner Violence: Not on file    Family History  Problem Relation Age of Onset   Obesity Mother    Asthma Mother    Miscarriages / [place] Mother    Diabetes Mother    Depression Mother    Mental illness Father    ADD / ADHD Sister    Bipolar disorder Sister    Heart disease Maternal Grandmother    Diabetes Maternal Grandmother    Bipolar disorder Maternal Grandmother    Bipolar disorder Maternal Grandfather    Multiple sclerosis Maternal Grandfather     The following portions of the patient's history were reviewed and updated as appropriate: allergies,  current medications, past family history, past medical history, past social history, past surgical history and problem list.  Review of Systems Pertinent items noted in HPI and remainder of comprehensive ROS otherwise negative.   Objective:  BP 98/62    Pulse 78    Ht 4\' 10"  (1.473 m)    Wt 112 lb (50.8 kg)    LMP 08/24/2021 (Exact Date)    BMI 23.41 kg/m  CONSTITUTIONAL: Well-developed, well-nourished female in no acute distress.  HENT:  Normocephalic, atraumatic, External right and left ear normal.  EYES: Conjunctivae and EOM are normal. Pupils are equal, round.  No scleral icterus.  NECK: Normal range of motion, supple, no masses.  Normal thyroid.  SKIN: Skin is warm and dry. No rash noted. Not diaphoretic. No erythema. No pallor. NEUROLOGIC: Alert and oriented to person, place, and time. Normal reflexes, muscle tone coordination. No cranial nerve deficit noted. PSYCHIATRIC: Normal mood and affect. Normal behavior. Normal judgment and thought content. CARDIOVASCULAR: Normal heart rate noted, regular rhythm RESPIRATORY: Clear to auscultation bilaterally. Effort and breath sounds normal, no problems with respiration noted. BREASTS: Symmetric in size. No masses, skin changes, nipple drainage, or lymphadenopathy. ABDOMEN: Soft, no distention noted.  No tenderness, rebound or guarding.  PELVIC: Normal appearing external genitalia; normal appearing vaginal mucosa and cervix.  No abnormal discharge noted.  Pap smear obtained.  Normal uterine size, no other palpable masses, no uterine or adnexal tenderness.  Small amount of dark blood noted. MUSCULOSKELETAL: Normal range of motion. No tenderness.  No cyanosis, clubbing, or edema.    Assessment and Plan:  1. Encounter for annual routine gynecological examination Has never had pap Does not use condoms - causes irritation  - Cytology - PAP( [place]) - HepB+HepC+HIV Panel - RPR - POCT urine pregnancy  2. Dysmenorrhea Wants Depo primarily  for heavy menses Advised no sex or condoms for 2 weeks to avoid pregnancy Has been using pulling out as her method - advised sperm is in any seminal fluid even before partner climaxes  3. MDD (major depressive disorder), recurrent severe, without psychosis ([place]) Will make a referral to in office [place]  4. Encounter for initial prescription of injectable contraceptive Urine pregnancy test is negative today. Beginning to have bleeding on exam First Depo given today.  Will follow up results of pap smear and manage accordingly. Routine preventative health maintenance measures emphasized. Please refer to After Visit Summary for other counseling recommendations.    Earlie Server, RN, MSN, NP-BC Nurse Practitioner, [place] for [facility]

## 2021-09-22 LAB — POCT URINE PREGNANCY: Preg Test, Ur: NEGATIVE

## 2021-09-22 LAB — HEPB+HEPC+HIV PANEL
HIV Screen 4th Generation wRfx: NONREACTIVE
Hep B C IgM: NEGATIVE
Hep B Core Total Ab: NEGATIVE
Hep B E Ab: NEGATIVE
Hep B E Ag: NEGATIVE
Hep B Surface Ab, Qual: NONREACTIVE
Hep C Virus Ab: <0.1 s/co ratio (ref 0.0–0.9)
Hepatitis B Surface Ag: NEGATIVE

## 2021-09-22 LAB — RPR: RPR Ser Ql: NONREACTIVE

## 2021-09-25 ENCOUNTER — Telehealth: Payer: Self-pay | Admitting: Licensed Clinical Social Worker

## 2021-09-25 ENCOUNTER — Encounter: Payer: Medicaid Other | Admitting: Licensed Clinical Social Worker

## 2021-09-25 LAB — CYTOLOGY - PAP
Comment: NEGATIVE
Diagnosis: UNDETERMINED — AB
High risk HPV: POSITIVE — AB

## 2021-09-25 NOTE — Telephone Encounter (Signed)
Called pt regarding scheduled mychart visit. Unable to leave message due to mailbox is full. Sent text message link to patient via cell phone. Pt no show visit.

## 2021-09-29 ENCOUNTER — Encounter: Payer: Self-pay | Admitting: Nurse Practitioner

## 2021-09-29 DIAGNOSIS — R87810 Cervical high risk human papillomavirus (HPV) DNA test positive: Secondary | ICD-10-CM | POA: Insufficient documentation

## 2021-11-24 ENCOUNTER — Encounter: Payer: Self-pay | Admitting: Obstetrics

## 2021-11-25 ENCOUNTER — Other Ambulatory Visit (HOSPITAL_COMMUNITY): Payer: Self-pay

## 2021-11-30 ENCOUNTER — Ambulatory Visit (INDEPENDENT_AMBULATORY_CARE_PROVIDER_SITE_OTHER): Payer: Medicaid Other | Admitting: Psychiatry

## 2021-11-30 DIAGNOSIS — F411 Generalized anxiety disorder: Secondary | ICD-10-CM | POA: Diagnosis not present

## 2021-11-30 DIAGNOSIS — F332 Major depressive disorder, recurrent severe without psychotic features: Secondary | ICD-10-CM | POA: Diagnosis not present

## 2021-11-30 DIAGNOSIS — F5105 Insomnia due to other mental disorder: Secondary | ICD-10-CM | POA: Diagnosis not present

## 2021-11-30 MED ORDER — MIRTAZAPINE 15 MG PO TBDP: 7.5000 mg | ORAL_TABLET | Freq: Every day | ORAL | 1 refills | Status: DC

## 2021-11-30 MED ORDER — TRAZODONE HCL 50 MG PO TABS: 50.0000 mg | ORAL_TABLET | Freq: Every evening | ORAL | 0 refills | Status: DC | PRN

## 2021-11-30 MED ORDER — HYDROXYZINE HCL 25 MG PO TABS: 25.0000 mg | ORAL_TABLET | Freq: Three times a day (TID) | ORAL | 0 refills | Status: DC | PRN

## 2021-11-30 NOTE — Progress Notes (Signed)
Psychiatric Initial Adult Assessment   Patient Identification: Suzanne Garcia MRN:  [id number] Date of Evaluation:  11/30/2021  Referral Source: self Chief Complaint:  "I want to get back on my medications" Virtual Visit via Video Note  I connected with Suzanne Garcia on 11/30/21 at  1:30 PM EST by a video enabled telemedicine application and verified that I am speaking with the correct person using two identifiers.  Location: Patient: home Provider: off site   I discussed the limitations of evaluation and management by telemedicine and the availability of in person appointments. The patient expressed understanding and agreed to proceed.    I discussed the assessment and treatment plan with the patient. The patient was provided an opportunity to ask questions and all were answered. The patient agreed with the plan and demonstrated an understanding of the instructions.   The patient was advised to call back or seek an in-person evaluation if the symptoms worsen or if the condition fails to improve as anticipated.  I provided 20 minutes of non-face-to-face time during this encounter.   Franne Grip, NP   Visit Diagnosis:    ICD-10-CM   1. MDD (major depressive disorder), recurrent severe, without psychosis ([place])  F33.2     2. Anxiety state  F41.1     3. 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 She reports a psychiatric history of anxiety and depression. Patient reports that she was prescribed Remeron 15 mg at bedtime, Trazodone 50 mg at bedtime as needed for sleep, and Hydroxyzine 25 mg three times daily as needed for anxiety. Patient reports medication noncompliance for the past year but wants to restart medications today. Patient reported an inpatient psychiatric hospitalization at [facility] in 2016  following a suicide attempt by taking her grandfather's medications.  She reports a history of self-injurious behavior by cutting.  Patient stated she started cutting at 21 years old and she cut  last in August 2022. Patient is alert and oriented x4, calm, pleasant and willing to engage.  She reports a depressed mood and states that her anxiety is a rate of 7 on a 10 point scale.  She also reports a decreased appetite, stating that she eats 2 meals per day, breakfast and dinner.  Patient also reports decreased sleep, stating that she has nightmares that she turns into a monster or something is trying to kill her.  Patient also reported nightmares about her great grandmother. Patient reports recent life stressors, stating that her great-grandmother died approximately 1-1/2 weeks ago.  Patient states my great-grandmother was like a second mother to me.  Today patient denies suicidal or homicidal ideations, paranoia, delusions, or auditory or visual hallucinations.  Associated Signs/Symptoms: Depression Symptoms:  depressed mood, anhedonia, insomnia, anxiety, disturbed sleep, decreased appetite, (Hypo) Manic Symptoms:   none Anxiety Symptoms:  Excessive Worry, Psychotic Symptoms:   none PTSD Symptoms: Negative  Past Psychiatric History: major depressive disorder, ADHD  Previous Psychotropic Medications: Yes   Substance Abuse History in the last 12 months:  No.  Consequences of Substance Abuse: Negative  Past Medical History:  Past Medical History:  Diagnosis Date   Anxiety    Bipolar disorder ([place])    Depression     Past Surgical History:  Procedure Laterality Date   NO PAST SURGERIES  Family Psychiatric History:see below  Family History:  Family History  Problem Relation Age of Onset   Obesity Mother    Asthma Mother    Miscarriages / [place] Mother    Diabetes Mother    Depression Mother    Mental illness Father    ADD / ADHD Sister    Bipolar disorder Sister     Heart disease Maternal Grandmother    Diabetes Maternal Grandmother    Bipolar disorder Maternal Grandmother    Bipolar disorder Maternal Grandfather    Multiple sclerosis Maternal Grandfather     Social History:   Social History   Socioeconomic History   Marital status: Single    Spouse name: Not on file   Number of children: Not on file   Years of education: Not on file   Highest education level: Not on file  Occupational History   Not on file  Tobacco Use   Smoking status: Never   Smokeless tobacco: Never  Vaping Use   Vaping Use: Never used  Substance and Sexual Activity   Alcohol use: Not Currently   Drug use: Not Currently   Sexual activity: Not on file  Other Topics Concern   Not on file  Social History Narrative   Born at [facility] in [place]. Jaundice in NBN.      Social Determinants of Health   Financial Resource Strain: Not on file  Food Insecurity: Not on file  Transportation Needs: Not on file  Physical Activity: Not on file  Stress: Not on file  Social Connections: Not on file    Additional Social History: none  Allergies:   Allergies  Allergen Reactions   Ibuprofen     Patient states that it causes "stomach thinning"     Metabolic Disorder Labs: Lab Results  Component Value Date   HGBA1C 5.0 12/02/2020   MPG 96.8 12/02/2020   MPG 99.67 05/27/2018   Lab Results  Component Value Date   PROLACTIN 40.7 (H) 05/30/2018   PROLACTIN 49.0 (H) 05/27/2018   Lab Results  Component Value Date   CHOL 158 12/02/2020   TRIG 28 12/02/2020   HDL 58 12/02/2020   CHOLHDL 2.7 12/02/2020   VLDL 6 12/02/2020   LDLCALC 94 12/02/2020   LDLCALC 70 05/27/2018   Lab Results  Component Value Date   TSH 1.614 12/02/2020    Therapeutic Level Labs: No results found for: LITHIUM No results found for: CBMZ No results found for: VALPROATE  Current Medications: Current Outpatient Medications  Medication Sig Dispense Refill   benzonatate (TESSALON) 100  MG capsule Take 1 capsule (100 mg total) by mouth every 8 (eight) hours. 21 capsule 0   hydrOXYzine (ATARAX) 25 MG tablet Take 1 tablet (25 mg total) by mouth 3 (three) times daily as needed for anxiety. 75 tablet 0   medroxyPROGESTERone (DEPO-PROVERA) 150 MG/ML injection Inject 1 mL (150 mg total) into the muscle every 3 (three) months. 1 mL 4   methocarbamol (ROBAXIN) 500 MG tablet Take 1 tablet (500 mg total) by mouth 2 (two) times daily. (Patient not taking: Reported on 06/03/2021) 20 tablet 0   mirtazapine (REMERON SOL-TAB) 15 MG disintegrating tablet Take 0.5 tablets (7.5 mg total) by mouth at bedtime. For depression.sleep 15 tablet 1   traMADol (ULTRAM) 50 MG tablet Take 1 tablet (50 mg total) by mouth every 6 (six) hours as needed. 15 tablet 0   traZODone (DESYREL) 50 MG tablet Take 1 tablet (50 mg total) by mouth at bedtime  as needed for sleep. 30 tablet 0   No current facility-administered medications for this visit.    Musculoskeletal: Strength & Muscle Tone:  n/a virtual visit Gait & Station:  n/a virtual visit Patient leans: N/A  Psychiatric Specialty Exam: Review of Systems  Psychiatric/Behavioral:  Positive for dysphoric mood and sleep disturbance. Negative for hallucinations, self-injury and suicidal ideas. The patient is nervous/anxious.   All other systems reviewed and are negative.  There were no vitals taken for this visit.There is no height or weight on file to calculate BMI.  General Appearance: fairly groomed  Eye Contact:  good  Speech:  Clear and Coherent  Volume:  Normal  Mood:  Depressed  Affect:  Congruent  Thought Process:  Goal Directed  Orientation:  Full (Time, Place, and Person)  Thought Content:  Logical  Suicidal Thoughts:  No  Homicidal Thoughts:  No  Memory:  Immediate;   Good Recent;   Good Remote;   Good  Judgement:  Good  Insight:  Good  Psychomotor Activity:  NA  Concentration:  Concentration: Good and Attention Span: Good  Recall:  Good   Fund of Knowledge:Good  Language: Good  Akathisia:  NA  Handed:  Right  AIMS (if indicated):  not done  Assets:  Communication Skills Desire for Improvement  ADL's:  Intact  Cognition: WNL  Sleep:  Poor   Screenings: AIMS    Flowsheet Row Admission (Discharged) from 12/01/2020 in [facility] Admission (Discharged) from OP Visit from 05/26/2018 in [place] Admission (Discharged) from 04/30/2015 in [place] Total Score 0 0 0      AUDIT    Flowsheet Row Admission (Discharged) from 12/01/2020 in [facility] [facility] Admission (Discharged) from OP Visit from 05/26/2018 in [place] [facility]  Alcohol Use Disorder Identification Test Final Score (AUDIT) 2 0      GAD-7    Flowsheet Row Office Visit from 11/30/2021 in [facility] Office Visit from 04/23/2020 in [place] [facility] [place] [facility]  Total GAD-7 Score 14 8      PHQ2-9    [place] Visit from 11/30/2021 in [facility] Office Visit from 04/23/2020 in [facility] [place] [facility]  PHQ-2 Total Score 4 1  PHQ-9 Total Score 12 [place] Office Visit from 11/30/2021 in [facility] ED from 09/13/2021 in [place] ED from 08/24/2021 in [place] No Risk No Risk       Assessment and Plan: Suzanne Garcia is a 21 year old female presenting to [facility] for an initial psychiatric evaluation. She reports a psychiatric history of anxiety and depression. Patient reports that she was prescribed Remeron 15 mg at bedtime, Trazodone 50 mg at bedtime as needed for sleep, and  Hydroxyzine 25 mg three times daily as needed for anxiety. Patient reports medication noncompliance for no voices reason for the past year but wants to restart medications today. Remeron reordered at lower dose of 7.5 mg at bedtime to titrate to previous dose if recommended. Trazodone and Hydroxyzine ordered at previous dosages. Medications benefits vs. Risks discussed. Individual therapy recommended but patient declined. Patient stated that she has had two unsuccessful attempts  to participate in effective therapy.  Collaboration of Care: Medication Management AEB medications e-scribed to patient's preferred pharmacy.  Treatment/Medications:  1. MDD (major depressive disorder), recurrent severe, without psychosis ([place]) Decreased to titrate slowly to reach previous dosage if recommended- mirtazapine (REMERON SOL-TAB) 15 MG disintegrating tablet; Take 0.5 tablets (7.5 mg total) by mouth at bedtime. For depression.sleep  Dispense: 15 tablet; Refill: 1  2. Anxiety state - hydrOXYzine (ATARAX) 25 MG tablet; Take 1 tablet (25 mg total) by mouth 3 (three) times daily as needed for anxiety.  Dispense: 75 tablet; Refill: 0  3. Insomnia related to another mental disorder - traZODone (DESYREL) 50 MG tablet; Take 1 tablet (50 mg total) by mouth at bedtime as needed for sleep.  Dispense: 30 tablet; Refill: 0   Meds ordered this encounter  Medications   hydrOXYzine (ATARAX) 25 MG tablet    Sig: Take 1 tablet (25 mg total) by mouth 3 (three) times daily as needed for anxiety.    Dispense:  75 tablet    Refill:  0    Order Specific Question:   Supervising Provider    Answer:   Hampton Abbot [[id number]]   mirtazapine (REMERON SOL-TAB) 15 MG disintegrating tablet    Sig: Take 0.5 tablets (7.5 mg total) by mouth at bedtime. For depression.sleep    Dispense:  15 tablet    Refill:  1    Order Specific Question:   Supervising Provider    Answer:   Hampton Abbot [[id number]]   traZODone (DESYREL) 50 MG tablet    Sig:  Take 1 tablet (50 mg total) by mouth at bedtime as needed for sleep.    Dispense:  30 tablet    Refill:  0    Order Specific Question:   Supervising Provider    Answer:   Hampton Abbot [[id number]]    Return to care in 6 weeks   Patient/Guardian was advised Release of Information must be obtained prior to any record release in order to collaborate their care with an outside provider. Patient/Guardian was advised if they have not already done so to contact the registration department to sign all necessary forms in order for [place] to release information regarding their care.   Consent: Patient/Guardian gives verbal consent for treatment and assignment of benefits for services provided during this visit. Patient/Guardian expressed understanding and agreed to proceed.   Franne Grip, NP 2/27/20236:58 PM

## 2021-11-30 NOTE — Addendum Note (Signed)
Addended by: Mcneil Sober on: 11/30/2021 07:01 PM   Modules accepted: Orders

## 2021-12-08 ENCOUNTER — Ambulatory Visit: Payer: Medicaid Other | Admitting: Family Medicine

## 2021-12-14 ENCOUNTER — Ambulatory Visit: Payer: Medicaid Other

## 2021-12-29 ENCOUNTER — Telehealth (HOSPITAL_COMMUNITY): Payer: Self-pay | Admitting: *Deleted

## 2021-12-29 NOTE — Telephone Encounter (Signed)
Pharmacy request for Trzazadone as well as for her Hydroxyzine. Will forward this request for Trazodone to her provider.  ?

## 2021-12-29 NOTE — Telephone Encounter (Signed)
Pharmacy request from [facility]  requesting rx for Hydroxyzine 25 mg, she was last seen on  2/27 and return appt on  4/110/23. She should have been out of meds today. Will forward request to Ce Ce NP for a new rx to be sent in for her.  ?

## 2021-12-30 ENCOUNTER — Ambulatory Visit (HOSPITAL_COMMUNITY)
Admission: EM | Admit: 2021-12-30 | Discharge: 2021-12-30 | Disposition: A | Discharge status: Home / Self Care | Payer: Medicaid Other | Attending: Nurse Practitioner | Admitting: Nurse Practitioner

## 2021-12-30 ENCOUNTER — Telehealth (HOSPITAL_COMMUNITY): Payer: Self-pay | Admitting: *Deleted

## 2021-12-30 ENCOUNTER — Encounter (HOSPITAL_COMMUNITY): Payer: Self-pay | Admitting: Emergency Medicine

## 2021-12-30 ENCOUNTER — Other Ambulatory Visit: Payer: Self-pay

## 2021-12-30 DIAGNOSIS — R8290 Unspecified abnormal findings in urine: Secondary | ICD-10-CM | POA: Diagnosis not present

## 2021-12-30 LAB — POCT URINALYSIS DIPSTICK, ED / UC
Bilirubin Urine: NEGATIVE
Glucose, UA: NEGATIVE mg/dL
Hgb urine dipstick: NEGATIVE
Ketones, ur: NEGATIVE mg/dL
Leukocytes,Ua: NEGATIVE
Nitrite: NEGATIVE
Protein, ur: NEGATIVE mg/dL
Specific Gravity, Urine: >=1.03 (ref 1.005–1.030)
Urobilinogen, UA: 0.2 mg/dL (ref 0.0–1.0)
pH: 6 (ref 5.0–8.0)

## 2021-12-30 LAB — POC URINE PREG, ED: Preg Test, Ur: NEGATIVE

## 2021-12-30 NOTE — Telephone Encounter (Signed)
Second pharmacy request for patients vistaril and trazodone. She should be out and her next appt with provider is on 01/11/22. Will forward this request for patients meds to be sent in to her preferred pharmacy. ?

## 2021-12-30 NOTE — ED Provider Notes (Signed)
?[facility] ? ? ? ?CSN: [id number] ?Arrival date & time: 12/30/21  0840 ? ? ?  ? ?History   ?Chief Complaint ?Chief Complaint  ?Patient presents with  ? urine odor  ? ? ?HPI ?Suzanne Garcia is a 21 y.o. female.  ? ?Patient reports malodorous urine for the past few weeks.  She denies dysuria, urinary frequency or urgency, incontinence, hematuria, abdominal or back pain, flank pain, fever, nausea, vomiting.  She denies any vaginal discharge or itching.  She also reports her urine is darker in color than normal.  She reports she recently did start new medications.  She does not drink very much water and is unable to quantify how much she drinks normally. ? ? ? ? ?Past Medical History:  ?Diagnosis Date  ? Anxiety   ? Bipolar disorder (HCC)   ? Depression   ? ? ?Patient Active Problem List  ? Diagnosis Date Noted  ? ASCUS with positive high risk HPV cervical 09/29/2021  ? Suicidal ideation 12/02/2020  ? MDD (major depressive disorder), recurrent severe, without psychosis (HCC) 12/01/2020  ? ADHD 06/04/2018  ? Short stature 06/24/2015  ? Failed vision screen 06/24/2015  ? ? ?Past Surgical History:  ?Procedure Laterality Date  ? NO PAST SURGERIES    ? ? ?OB History   ? ? Gravida  ?1  ? Para  ?0  ? Term  ?0  ? Preterm  ?0  ? AB  ?1  ? Living  ?0  ?  ? ? SAB  ?1  ? IAB  ?0  ? Ectopic  ?0  ? Multiple  ?0  ? Live Births  ?0  ?   ?  ?  ? ? ? ?Home Medications   ? ?Prior to Admission medications   ?Medication Sig Start Date End Date Taking? Authorizing Provider  ?hydrOXYzine (ATARAX) 25 MG tablet Take 1 tablet (25 mg total) by mouth 3 (three) times daily as needed for anxiety. 11/30/21   Mcneil Sober, NP  ?medroxyPROGESTERone (DEPO-PROVERA) 150 MG/ML injection Inject 1 mL (150 mg total) into the muscle every 3 (three) months. 09/21/21   Burleson, Brand Males, NP  ?methocarbamol (ROBAXIN) 500 MG tablet Take 1 tablet (500 mg total) by mouth 2 (two) times daily. ?Patient not taking: Reported on 06/03/2021 06/02/21   Claudie Leach, PA-C  ?mirtazapine (REMERON SOL-TAB) 15 MG disintegrating tablet Take 0.5 tablets (7.5 mg total) by mouth at bedtime. For depression.sleep 11/30/21   Mcneil Sober, NP  ?traZODone (DESYREL) 50 MG tablet Take 1 tablet (50 mg total) by mouth at bedtime as needed for sleep. 11/30/21   Mcneil Sober, NP  ? ? ?Family History ?Family History  ?Problem Relation Age of Onset  ? Obesity Mother   ? Asthma Mother   ? Miscarriages / [place] Mother   ? Diabetes Mother   ? Depression Mother   ? Mental illness Father   ? ADD / ADHD Sister   ? Bipolar disorder Sister   ? Heart disease Maternal Grandmother   ? Diabetes Maternal Grandmother   ? Bipolar disorder Maternal Grandmother   ? Bipolar disorder Maternal Grandfather   ? Multiple sclerosis Maternal Grandfather   ? ? ?Social History ?Social History  ? ?Tobacco Use  ? Smoking status: Never  ? Smokeless tobacco: Never  ?Vaping Use  ? Vaping Use: Never used  ?Substance Use Topics  ? Alcohol use: Not Currently  ? Drug use: Not Currently  ? ? ? ?Allergies   ?  Ibuprofen ? ? ?Review of Systems ?Review of Systems ?Per HPI ? ?Physical Exam ?Triage Vital Signs ?ED Triage Vitals  ?Enc Vitals Group  ?   BP 12/30/21 0906 104/66  ?   Pulse Rate 12/30/21 0906 67  ?   Resp 12/30/21 0906 17  ?   Temp 12/30/21 0906 98.3 ?F (36.8 ?C)  ?   Temp Source 12/30/21 0906 Oral  ?   SpO2 12/30/21 0906 99 %  ?   Weight --   ?   Height --   ?   Head Circumference --   ?   Peak Flow --   ?   Pain Score 12/30/21 0904 0  ?   Pain Loc --   ?   Pain Edu? --   ?   Excl. in GC? --   ? ?No data found. ? ?Updated Vital Signs ?BP 104/66   Pulse 67   Temp 98.3 ?F (36.8 ?C) (Oral)   Resp 17   SpO2 99%  ? ?Visual Acuity ?Right Eye Distance:   ?Left Eye Distance:   ?Bilateral Distance:   ? ?Right Eye Near:   ?Left Eye Near:    ?Bilateral Near:    ? ?Physical Exam ?Vitals and nursing note reviewed.  ?Constitutional:   ?   General: She is not in acute distress. ?   Appearance: Normal appearance. She is not  toxic-appearing.  ?Pulmonary:  ?   Effort: Pulmonary effort is normal. No respiratory distress.  ?Abdominal:  ?   General: Abdomen is flat. Bowel sounds are normal. There is no distension.  ?   Palpations: Abdomen is soft.  ?   Tenderness: There is no abdominal tenderness. There is no right CVA tenderness, left CVA tenderness or guarding.  ?Skin: ?   General: Skin is warm and dry.  ?   Coloration: Skin is not jaundiced or pale.  ?   Findings: No erythema.  ?Neurological:  ?   Mental Status: She is alert and oriented to person, place, and time.  ?Psychiatric:     ?   Mood and Affect: Mood normal.     ?   Behavior: Behavior normal.  ? ? ? ?UC Treatments / Results  ?Labs ?(all labs ordered are listed, but only abnormal results are displayed) ?Labs Reviewed  ?POCT URINALYSIS DIPSTICK, ED / UC  ?POC URINE PREG, ED  ? ? ?EKG ? ? ?Radiology ?No results found. ? ?Procedures ?Procedures (including critical care time) ? ?Medications Ordered in UC ?Medications - No data to display ? ?Initial Impression / Assessment and Plan / UC Course  ?I have reviewed the triage vital signs and the nursing notes. ? ?Pertinent labs & imaging results that were available during my care of the patient were reviewed by me and considered in my medical decision making (see chart for details). ? ?  ?UA today normal with exception of specific gravity which is elevated.  I suspect the patient's urine is very concentrated, therefore the specific gravity is elevated.  We discussed that this certainly can explain the change in the odor and color of her urine.  I encouraged her to drink 64 ounces of water daily.  We also discussed that the new medication may also attribute to darker urine.  If her symptoms do not improve with increasing water intake, follow-up with PCP for further work-up. ?Final Clinical Impressions(s) / UC Diagnoses  ? ?Final diagnoses:  ?Urine abnormality  ? ? ? ?Discharge Instructions   ? ?  ?-  The urine test today shows that your  urine is more concentrated, suggesting you may be dehydrated.  Please start drinking more water - a good goal is 64 oz daily ?- Please seek care if your symptoms do not improve after increasing water intake ? ? ? ? ?ED Prescriptions   ?None ?  ? ?PDMP not reviewed this encounter. ?  ?Valentino Nose, NP ?12/30/21 0935 ? ?

## 2021-12-30 NOTE — ED Triage Notes (Signed)
Pt is present today with c/o foul smelling urine. Pt denies any vaginal discharge, urine frequency, or vaginal irritation. Pt sx started a few weeks ago.  ?

## 2021-12-30 NOTE — Discharge Instructions (Addendum)
-   The urine test today shows that your urine is more concentrated, suggesting you may be dehydrated.  Please start drinking more water - a good goal is 64 oz daily ?- Please seek care if your symptoms do not improve after increasing water intake ?

## 2022-01-07 ENCOUNTER — Other Ambulatory Visit (HOSPITAL_COMMUNITY)
Admission: RE | Admit: 2022-01-07 | Discharge: 2022-01-07 | Disposition: A | Discharge status: Home / Self Care | Payer: Medicaid Other | Source: Ambulatory Visit | Attending: Family Medicine | Admitting: Family Medicine

## 2022-01-07 ENCOUNTER — Encounter: Payer: Self-pay | Admitting: Obstetrics

## 2022-01-07 ENCOUNTER — Ambulatory Visit (INDEPENDENT_AMBULATORY_CARE_PROVIDER_SITE_OTHER): Payer: Medicaid Other | Admitting: Obstetrics

## 2022-01-07 VITALS — BP 105/65 | HR 77 | Ht <= 58 in | Wt 117.3 lb

## 2022-01-07 DIAGNOSIS — B009 Herpesviral infection, unspecified: Secondary | ICD-10-CM

## 2022-01-07 DIAGNOSIS — N898 Other specified noninflammatory disorders of vagina: Secondary | ICD-10-CM | POA: Diagnosis present

## 2022-01-07 DIAGNOSIS — Z113 Encounter for screening for infections with a predominantly sexual mode of transmission: Secondary | ICD-10-CM

## 2022-01-07 DIAGNOSIS — N912 Amenorrhea, unspecified: Secondary | ICD-10-CM | POA: Diagnosis not present

## 2022-01-07 LAB — POCT URINE PREGNANCY: Preg Test, Ur: NEGATIVE

## 2022-01-07 MED ORDER — VALACYCLOVIR HCL 1 G PO TABS
1000.0000 mg | ORAL_TABLET | Freq: Two times a day (BID) | ORAL | 5 refills | Status: DC
Start: 2022-01-07 — End: 2024-07-27

## 2022-01-07 MED ORDER — METRONIDAZOLE 500 MG PO TABS: 500.0000 mg | ORAL_TABLET | Freq: Two times a day (BID) | ORAL | 2 refills | Status: DC

## 2022-01-07 NOTE — Progress Notes (Signed)
Patient ID: Suzanne Garcia, female   DOB: [birth year], 21 y.o.   MRN: [id number] ? ?Chief Complaint  ?Patient presents with  ? Results  ? ? ?HPI ?Suzanne Garcia is a 21 y.o. female.  Malodorous vaginal discharge.  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 ?HPI ? ?Past Medical History:  ?Diagnosis Date  ? Anxiety   ? Bipolar disorder (HCC)   ? Depression   ? ? ?Past Surgical History:  ?Procedure Laterality Date  ? NO PAST SURGERIES    ? ? ?Family History  ?Problem Relation Age of Onset  ? Obesity Mother   ? Asthma Mother   ? Miscarriages / [place] Mother   ? Diabetes Mother   ? Depression Mother   ? Mental illness Father   ? ADD / ADHD Sister   ? Bipolar disorder Sister   ? Heart disease Maternal Grandmother   ? Diabetes Maternal Grandmother   ? Bipolar disorder Maternal Grandmother   ? Bipolar disorder Maternal Grandfather   ? Multiple sclerosis Maternal Grandfather   ? ? ?Social History ?Social History  ? ?Tobacco Use  ? Smoking status: Never  ? Smokeless tobacco: Never  ?Vaping Use  ? Vaping Use: Never used  ?Substance Use Topics  ? Alcohol use: Not Currently  ? Drug use: Not Currently  ? ? ?Allergies  ?Allergen Reactions  ? Ibuprofen   ?  Patient states that it causes "stomach thinning"   ? ? ?Current Outpatient Medications  ?Medication Sig Dispense Refill  ? hydrOXYzine (ATARAX) 25 MG tablet Take 1 tablet (25 mg total) by mouth 3 (three) times daily as needed for anxiety. 75 tablet 0  ? metroNIDAZOLE (FLAGYL) 500 MG tablet Take 1 tablet (500 mg total) by mouth 2 (two) times daily. 14 tablet 2  ? mirtazapine (REMERON SOL-TAB) 15 MG disintegrating tablet Take 0.5 tablets (7.5 mg total) by mouth at bedtime. For depression.sleep 15 tablet 1  ? traZODone (DESYREL) 50 MG tablet Take 1 tablet (50 mg total) by mouth at bedtime as needed for sleep. 30 tablet 0  ? valACYclovir (VALTREX) 1000 MG tablet Take 1 tablet (1,000 mg total) by mouth 2 (two) times daily. Take for 5 days with each outbreak. 30  tablet 5  ? ?No current facility-administered medications for this visit.  ? ? ?Review of Systems ?Review of Systems ?Constitutional: negative for fatigue and weight loss ?Respiratory: negative for cough and wheezing ?Cardiovascular: negative for chest pain, fatigue and palpitations ?Gastrointestinal: negative for abdominal pain and change in bowel habits ?Genitourinary: positive for vaginal discharge ?Integument/breast: negative for nipple discharge ?Musculoskeletal:negative for myalgias ?Neurological: negative for gait problems and tremors ?Behavioral/Psych: negative for abusive relationship, depression ?Endocrine: negative for temperature intolerance    ?  ?Blood pressure 105/65, pulse 77, height 4\' 10"  (1.473 m), weight 117 lb 4.8 oz (53.2 kg). ? ?Physical Exam ?Physical Exam ?General:   Alert and no distress  ?Skin:   no rash or abnormalities  ?Lungs:   clear to auscultation bilaterally  ?Heart:   regular rate and rhythm, S1, S2 normal, no murmur, click, rub or gallop  ?Breasts:   Not examined  ?Abdomen:  normal findings: no organomegaly, soft, non-tender and no hernia  ?Pelvis:  External genitalia: normal general appearance ?Urinary system: urethral meatus normal and bladder without fullness, nontender ?Vaginal: normal without tenderness, induration or masses ?Cervix: normal appearance ?Adnexa: normal bimanual exam ?Uterus: anteverted and non-tender, normal size  ?  ?I have spent a total  of 20 minutes of face-to-face time, excluding clinical staff time, reviewing notes and preparing to see patient, ordering tests and/or medications, and counseling the patient.  ? ?Data Reviewed ?Wet Prep and Cultures ? ?Assessment  ?   ?1. Vaginal discharge ?Rx: ?- Cervicovaginal ancillary only( [place]) ?- metroNIDAZOLE (FLAGYL) 500 MG tablet; Take 1 tablet (500 mg total) by mouth 2 (two) times daily.  Dispense: 14 tablet; Refill: 2 ? ?2. Screening for STD (sexually transmitted disease) ?Rx{ ?- Hepatitis B surface  antigen ?- Hepatitis C antibody ?- HIV Antibody (routine testing w rflx) ?- RPR ? ?3. HSV-2 infection ?Rx: ?- valACYclovir (VALTREX) 1000 MG tablet; Take 1 tablet (1,000 mg total) by mouth 2 (two) times daily. Take for 5 days with each outbreak.  Dispense: 30 tablet; Refill: 5 ? ?4. Amenorrhea due to Depo Provera ?Rx: ?- POCT urine pregnancy  ?  ? ?Plan ?  Follow up prn ? ?Orders Placed This Encounter  ?Procedures  ? Hepatitis B surface antigen  ? Hepatitis C antibody  ? HIV Antibody (routine testing w rflx)  ? RPR  ? POCT urine pregnancy  ? ?Meds ordered this encounter  ?Medications  ? metroNIDAZOLE (FLAGYL) 500 MG tablet  ?  Sig: Take 1 tablet (500 mg total) by mouth 2 (two) times daily.  ?  Dispense:  14 tablet  ?  Refill:  2  ? valACYclovir (VALTREX) 1000 MG tablet  ?  Sig: Take 1 tablet (1,000 mg total) by mouth 2 (two) times daily. Take for 5 days with each outbreak.  ?  Dispense:  30 tablet  ?  Refill:  5  ? ?  ? ? Brock Bad, MD ?01/07/2022 11:52 AM  ? ?

## 2022-01-07 NOTE — Progress Notes (Signed)
Patient presents to discuss HSV results. Patient [facility] and was told that she had hsv. Most recent outbreak was last August. Patient would like more education about hsv and start valtrex. She also complains of having a vaginal odor. She denies having a vaginal discharge or irritation at this time. Would like STD testing. ? ?Has not had a cycle since she received her last depo in 12/22. Does not want to start another method at this time. ? ?Last pap: 09/2021 ASCUS and HPV positive ?

## 2022-01-08 ENCOUNTER — Other Ambulatory Visit: Payer: Self-pay | Admitting: Obstetrics

## 2022-01-08 LAB — CERVICOVAGINAL ANCILLARY ONLY
Bacterial Vaginitis (gardnerella): POSITIVE — AB
Candida Glabrata: NEGATIVE
Candida Vaginitis: NEGATIVE
Chlamydia: NEGATIVE
Comment: NEGATIVE
Comment: NEGATIVE
Comment: NEGATIVE
Comment: NEGATIVE
Comment: NEGATIVE
Comment: NORMAL
Neisseria Gonorrhea: NEGATIVE
Trichomonas: NEGATIVE

## 2022-01-08 LAB — RPR: RPR Ser Ql: NONREACTIVE

## 2022-01-08 LAB — HEPATITIS C ANTIBODY: Hep C Virus Ab: NONREACTIVE

## 2022-01-08 LAB — HEPATITIS B SURFACE ANTIGEN: Hepatitis B Surface Ag: NEGATIVE

## 2022-01-08 LAB — HIV ANTIBODY (ROUTINE TESTING W REFLEX): HIV Screen 4th Generation wRfx: NONREACTIVE

## 2022-01-11 ENCOUNTER — Telehealth (HOSPITAL_COMMUNITY): Payer: Medicaid Other | Admitting: Psychiatry

## 2022-01-11 ENCOUNTER — Encounter (HOSPITAL_COMMUNITY): Payer: Self-pay

## 2022-01-11 MED ORDER — HYDROXYZINE HCL 25 MG PO TABS: 25.0000 mg | ORAL_TABLET | Freq: Three times a day (TID) | ORAL | 2 refills | Status: DC | PRN

## 2022-01-11 MED ORDER — TRAZODONE HCL 50 MG PO TABS: 50.0000 mg | ORAL_TABLET | Freq: Every evening | ORAL | 2 refills | Status: DC | PRN

## 2022-03-08 ENCOUNTER — Encounter (HOSPITAL_BASED_OUTPATIENT_CLINIC_OR_DEPARTMENT_OTHER): Payer: Self-pay

## 2022-03-08 ENCOUNTER — Emergency Department (HOSPITAL_BASED_OUTPATIENT_CLINIC_OR_DEPARTMENT_OTHER)
Admission: EM | Admit: 2022-03-08 | Discharge: 2022-03-08 | Disposition: A | Discharge status: Home / Self Care | Payer: Medicaid Other | Attending: Student | Admitting: Student

## 2022-03-08 ENCOUNTER — Other Ambulatory Visit: Payer: Self-pay

## 2022-03-08 DIAGNOSIS — M542 Cervicalgia: Secondary | ICD-10-CM | POA: Diagnosis not present

## 2022-03-08 DIAGNOSIS — Z5321 Procedure and treatment not carried out due to patient leaving prior to being seen by health care provider: Secondary | ICD-10-CM | POA: Diagnosis not present

## 2022-03-08 DIAGNOSIS — R42 Dizziness and giddiness: Secondary | ICD-10-CM | POA: Insufficient documentation

## 2022-03-08 DIAGNOSIS — W228XXA Striking against or struck by other objects, initial encounter: Secondary | ICD-10-CM | POA: Insufficient documentation

## 2022-03-08 LAB — URINALYSIS, ROUTINE W REFLEX MICROSCOPIC
Bilirubin Urine: NEGATIVE
Glucose, UA: NEGATIVE mg/dL
Hgb urine dipstick: NEGATIVE
Ketones, ur: NEGATIVE mg/dL
Leukocytes,Ua: NEGATIVE
Nitrite: NEGATIVE
Protein, ur: NEGATIVE mg/dL
Specific Gravity, Urine: 1.02 (ref 1.005–1.030)
pH: 7 (ref 5.0–8.0)

## 2022-03-08 LAB — PREGNANCY, URINE: Preg Test, Ur: NEGATIVE

## 2022-03-08 NOTE — ED Triage Notes (Signed)
PT reports she was hit in the by a roll of styrofoam roll that was on a forklift. Hit on the back of head. No loss of consciousness. Reports feeling dizzy and pain goes into neck

## 2022-03-08 NOTE — ED Notes (Signed)
In NAD.

## 2022-04-23 ENCOUNTER — Encounter (HOSPITAL_COMMUNITY): Payer: Self-pay

## 2022-04-23 ENCOUNTER — Ambulatory Visit (HOSPITAL_COMMUNITY)
Admission: EM | Admit: 2022-04-23 | Discharge: 2022-04-23 | Disposition: A | Discharge status: Home / Self Care | Payer: Medicaid Other

## 2022-04-23 DIAGNOSIS — Z789 Other specified health status: Secondary | ICD-10-CM | POA: Diagnosis not present

## 2022-04-23 DIAGNOSIS — L299 Pruritus, unspecified: Secondary | ICD-10-CM | POA: Diagnosis not present

## 2022-04-23 NOTE — ED Triage Notes (Signed)
Pt reports getting her belly button pierced 2 weeks. She reports some drainage and itching.

## 2022-04-23 NOTE — Discharge Instructions (Signed)
Advised to observe body piercing at the navel, use Vaseline only lightly to the area.  If over the next 6 months you noticed the keloid starting develop there then it is advised to remove the body piercing and just let it heal on its own.  If you notice any increased redness, swelling, irritation or unusual drainage then return for recheck.

## 2022-04-23 NOTE — ED Provider Notes (Signed)
[facility]    CSN: [id number] Arrival date & time: 04/23/22  1884      History   Chief Complaint Chief Complaint  Patient presents with   NAVAL PAIN    HPI Suzanne Garcia is a 21 y.o. female.   21 year old female presents with body piercing at the navel.  He Suzanne Garcia that 2 weeks ago she had her navel pierced.  She has a silver attachment at the piercing site.  Patient is concerned about possibly developing a keloid at the site patient indicates she is not having itching, there is no redness, and just some faint clear drainage that occurs at the top of the piercing patient decays she is not having fever or chills.  Patient indicates she has not had any problems with reacting to silver in the past.  Patient does indicate that she has had a keloid development at a wound area on the leg in the past and that is what makes her concerned.     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 Authorizing Provider  hydrOXYzine (ATARAX) 25 MG tablet Take 1 tablet (25 mg total) by mouth 3 (three) times daily as needed for anxiety. 01/11/22   Penn, Cranston Neighbor, NP  metroNIDAZOLE (FLAGYL) 500 MG tablet Take 1 tablet (500 mg total) by mouth 2 (two) times daily. 01/07/22   Brock Bad, MD  mirtazapine (REMERON SOL-TAB) 15 MG disintegrating tablet Take 0.5 tablets (7.5 mg total) by mouth at bedtime. For depression.sleep 11/30/21   Mcneil Sober, NP  traZODone (DESYREL) 50 MG tablet Take 1 tablet (50 mg total) by mouth at bedtime as needed for sleep. 01/11/22   Penn, Cranston Neighbor, NP  valACYclovir (VALTREX) 1000 MG tablet Take 1 tablet (1,000 mg total) by mouth 2 (two) times daily. Take for 5 days with each outbreak. 01/07/22   Brock Bad, MD    Family History Family History  Problem Relation Age of Onset   Obesity Mother    Asthma Mother    Miscarriages / [place] Mother    Diabetes Mother    Depression Mother    Mental illness Father    ADD / ADHD Sister    Bipolar disorder Sister    Heart disease Maternal Grandmother    Diabetes Maternal Grandmother    Bipolar disorder Maternal Grandmother    Bipolar disorder Maternal Grandfather    Multiple sclerosis Maternal Grandfather     Social History Social History  Tobacco Use   Smoking status: Never   Smokeless tobacco: Never  Vaping Use   Vaping Use: Never used  Substance Use Topics   Alcohol use: Not Currently   Drug use: Not Currently     Allergies   Ibuprofen   Review of Systems Review of Systems  Skin:  Positive for wound (naval piercing).     Physical Exam Triage Vital Signs ED Triage Vitals [04/23/22 0852]  Enc Vitals Group     BP 95/61     Pulse Rate 61     Resp 18     Temp 98.2 F (36.8 C)     Temp Source Oral     SpO2 99 %     Weight      Height      Head Circumference      Peak Flow      Pain Score      Pain Loc      Pain Edu?      Excl. in GC?    No data found.  Updated Vital Signs BP 95/61 (BP Location: Left Arm)   Pulse 61   Temp 98.2 F (36.8 C) (Oral)   Resp 18   SpO2 99%   Visual Acuity Right Eye Distance:   Left Eye Distance:   Bilateral Distance:    Right Eye Near:   Left Eye Near:    Bilateral Near:     Physical Exam Constitutional:       Appearance: Normal appearance.  Abdominal:       Comments: Abdomen: The navel has 2 piercings that are vertical.  The first piercing is a centimeter above the navel and the second piercing is below the first right at the navel fold.  There is a silver ornament that goes through the piercing.  There is no redness at the piercing site, drainage, or swelling.  The piercing seems to be healing well without scar formation today.  Neurological:     Mental Status: She is alert.      UC Treatments / Results  Labs (all labs ordered are listed, but only abnormal results are displayed) Labs Reviewed - No data to display  EKG   Radiology No results found.  Procedures Procedures (including critical care time)  Medications Ordered in UC Medications - No data to display  Initial Impression / Assessment and Plan / UC Course  I have reviewed the triage vital signs and the nursing notes.  Pertinent labs & imaging results that were available during my care of the patient were reviewed by me and considered in my medical decision making (see chart for details).    Pain: 1.  Patient has been advised to use Vaseline only at the piercing site to reduce skin irritation. 2.  Patient has been advised if she starts developing keloid formation at either the piercing sites over the next 3 months that she is to remove the piercing and is advised to avoid other piercings due to keloid formation 3.  Advised to follow-up with PCP or return if symptoms fail to improve Final Clinical Impressions(s) / UC Diagnoses   Final diagnoses:  Body piercing  Itching     Discharge Instructions      Advised to observe body piercing at the navel, use Vaseline only lightly to the area.  If over the next 6 months you noticed the keloid starting develop there then it is advised to remove the body piercing and just let it heal on its  own.  If you notice any increased redness, swelling, irritation or unusual drainage  then return for recheck.    ED Prescriptions   None    PDMP not reviewed this encounter.   Ellsworth Lennox, PA-C 04/23/22 [phone]

## 2022-05-06 ENCOUNTER — Ambulatory Visit (INDEPENDENT_AMBULATORY_CARE_PROVIDER_SITE_OTHER): Payer: Medicaid Other | Admitting: Emergency Medicine

## 2022-05-06 VITALS — BP 103/72 | Ht <= 58 in | Wt 123.4 lb

## 2022-05-06 DIAGNOSIS — Z30013 Encounter for initial prescription of injectable contraceptive: Secondary | ICD-10-CM

## 2022-05-06 NOTE — Progress Notes (Signed)
Pt presents for Depo restart. Last Depo in December 2022. UPT- Negative today in office. Instructed to return in 2 weeks for 2nd UPT for Depo restart. Verbalized understanding.

## 2022-05-20 ENCOUNTER — Ambulatory Visit (INDEPENDENT_AMBULATORY_CARE_PROVIDER_SITE_OTHER): Payer: Medicaid Other

## 2022-05-20 VITALS — BP 101/66 | HR 69 | Ht <= 58 in | Wt 127.0 lb

## 2022-05-20 DIAGNOSIS — Z30013 Encounter for initial prescription of injectable contraceptive: Secondary | ICD-10-CM

## 2022-05-20 LAB — POCT URINE PREGNANCY: Preg Test, Ur: NEGATIVE

## 2022-05-20 MED ORDER — MEDROXYPROGESTERONE ACETATE 150 MG/ML IM SUSP
150.0000 mg | Freq: Once | INTRAMUSCULAR | Status: AC
  Administered 2022-05-20: 150 mg via INTRAMUSCULAR

## 2022-05-20 MED ORDER — MEDROXYPROGESTERONE ACETATE 150 MG/ML IM SUSP: 150.0000 mg | INTRAMUSCULAR | 4 refills | Status: DC

## 2022-05-20 NOTE — Progress Notes (Addendum)
SUBJECTIVE: Suzanne Garcia is a 21 y.o. female who presents for DEPO Injection.  OBJECTIVE: Appears well, in no apparent distress.  Vital signs are normal.   ASSESSMENT: Need for BC. UPT today is NEGATIVE.    Last PAP 09/21/2021 Abnormal  PLAN: Office stock DEPO Injection given in [facility]OQ, tolerated well.  Next DEPO due November 2-16, 2023. RX sent to Pharmacy, Pt is to pick up Rx and take with her to DEPO appt.  Patient agreed to schedule an appointment for PAP smear for 09/22/2022.  Administrations This Visit     medroxyPROGESTERone (DEPO-PROVERA) injection 150 mg     Admin Date 05/20/2022 Action Given Dose 150 mg Route Intramuscular Administered By Maretta Bees, RMA

## 2022-05-20 NOTE — Progress Notes (Signed)
Patient was assessed and managed by nursing staff during this encounter. I have reviewed the chart and agree with the documentation and plan. I have also made any necessary editorial changes.  Warden Fillers, MD 05/20/2022 12:41 PM

## 2022-05-24 ENCOUNTER — Encounter: Payer: Self-pay | Admitting: Obstetrics

## 2022-05-24 ENCOUNTER — Other Ambulatory Visit: Payer: Self-pay

## 2022-05-24 DIAGNOSIS — N898 Other specified noninflammatory disorders of vagina: Secondary | ICD-10-CM

## 2022-05-24 MED ORDER — METRONIDAZOLE 500 MG PO TABS: 500.0000 mg | ORAL_TABLET | Freq: Two times a day (BID) | ORAL | 0 refills | Status: DC

## 2022-05-24 NOTE — Progress Notes (Signed)
Pt requesting rx for Flagyl.  Flagyl sent to the pharmacy per protocol.

## 2022-08-12 ENCOUNTER — Ambulatory Visit: Payer: Medicaid Other

## 2022-08-19 ENCOUNTER — Other Ambulatory Visit: Payer: Self-pay | Admitting: Obstetrics and Gynecology

## 2022-08-19 DIAGNOSIS — Z30013 Encounter for initial prescription of injectable contraceptive: Secondary | ICD-10-CM

## 2022-08-23 ENCOUNTER — Telehealth: Payer: Self-pay

## 2022-08-23 NOTE — Telephone Encounter (Signed)
Called pt to discuss depo not being covered by insurance, no answer, left vm

## 2023-07-20 IMAGING — US US PELVIS COMPLETE WITH TRANSVAGINAL
1 series · 14 of 25 positions shown · non-contrast
Comparison: Ultrasound from 09/03/2018.

CLINICAL DATA: Initial evaluation for dysmenorrhea.



[Series 1: us pelvic complete with transvaginal · 14 of 61 slices shown]
[im 1/61]
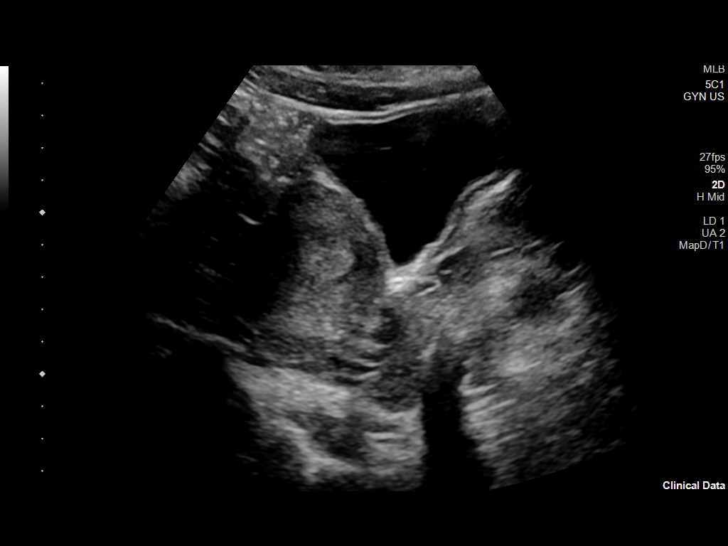
[im 6/61]
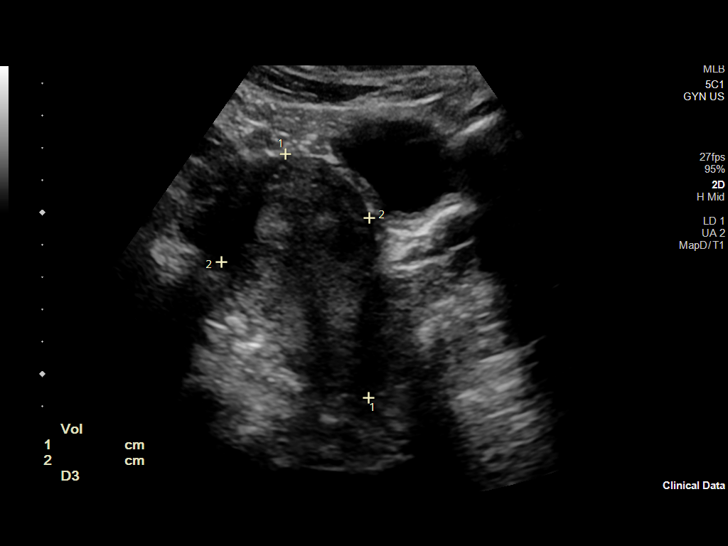
[im 11/61]
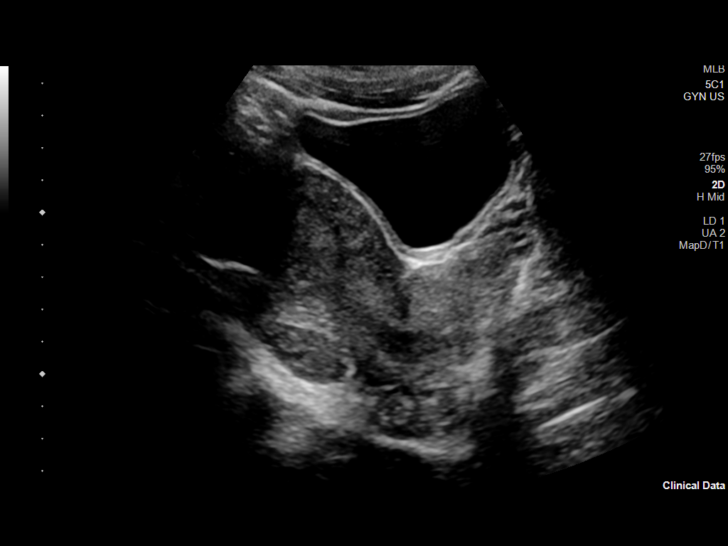
[im 16/61]
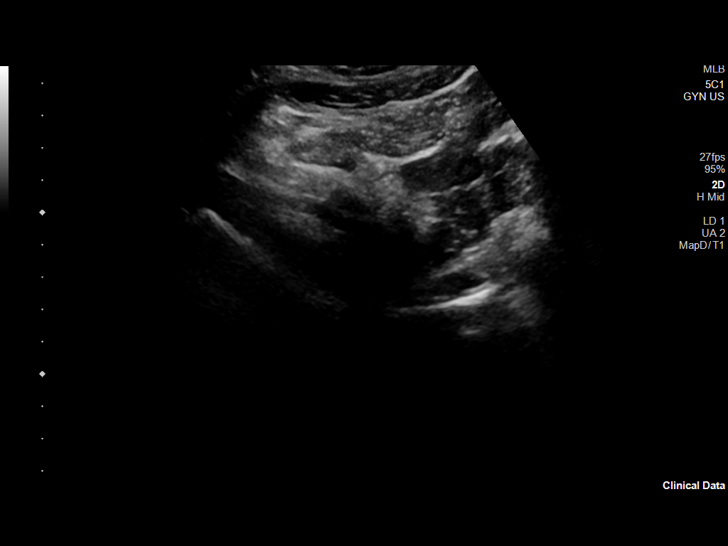
[im 21/61]
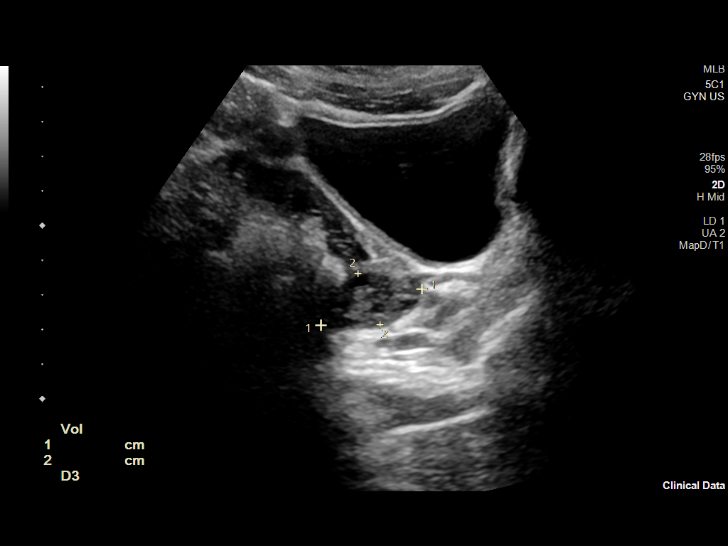
[im 23/61]
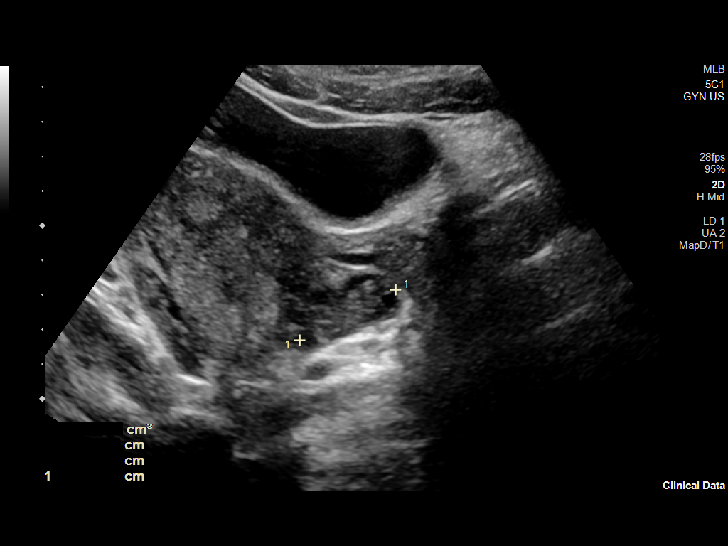
[im 28/61]
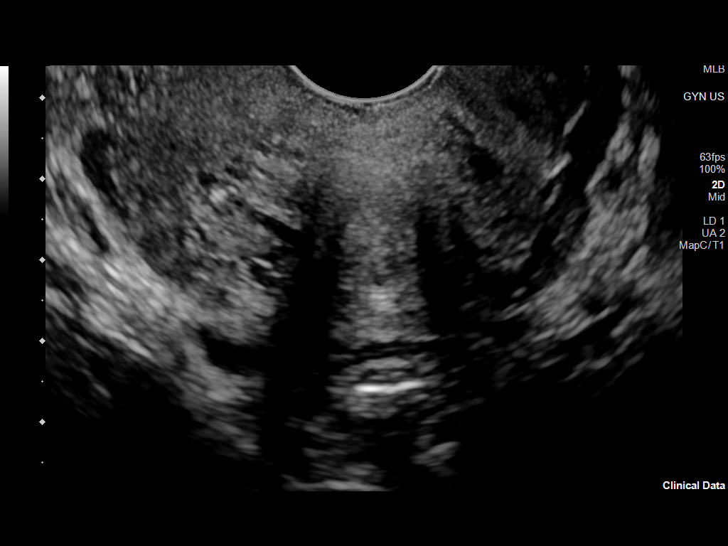
[im 33/61]
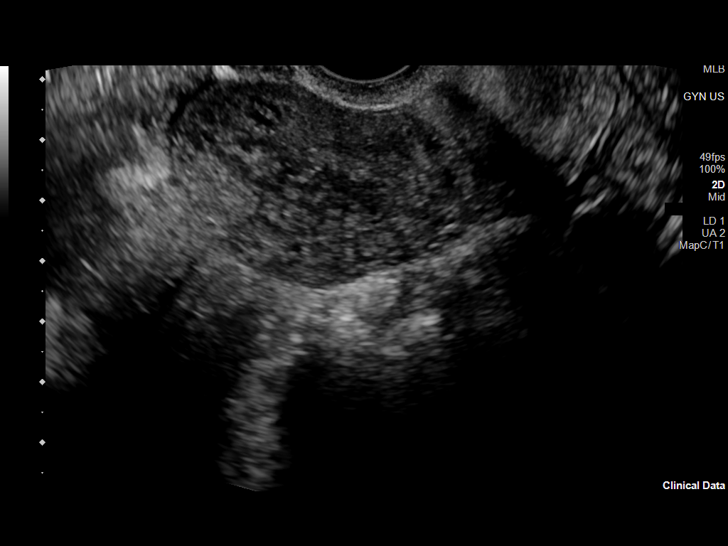
[im 38/61]
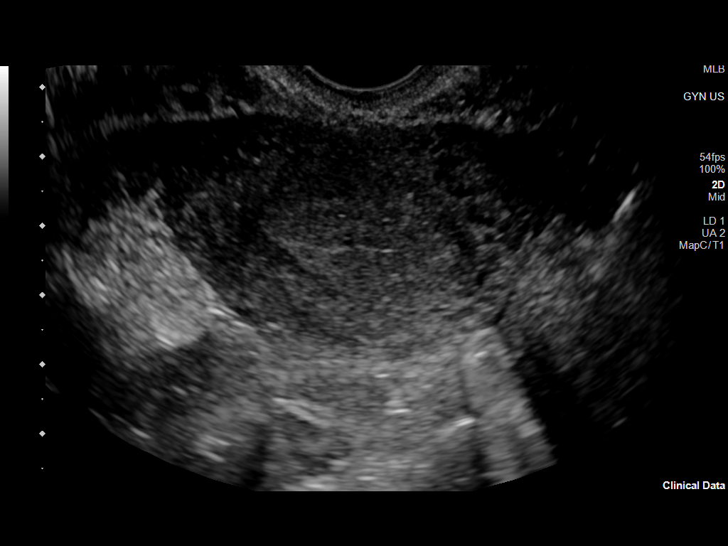
[im 41/61]
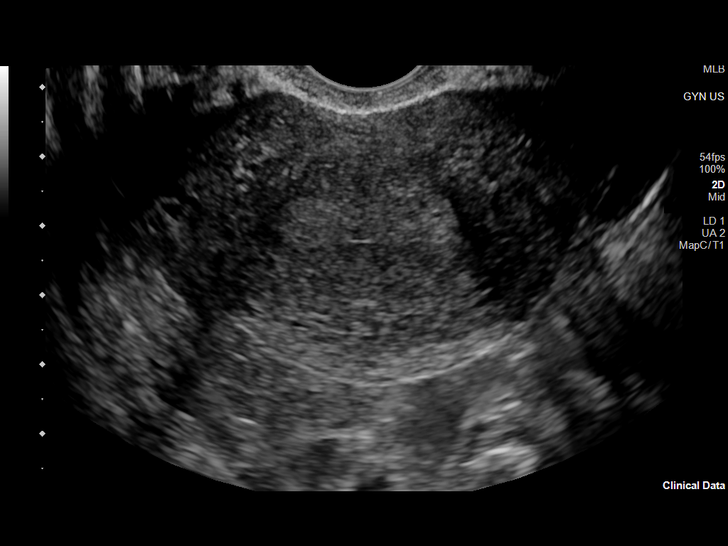
[im 46/61]
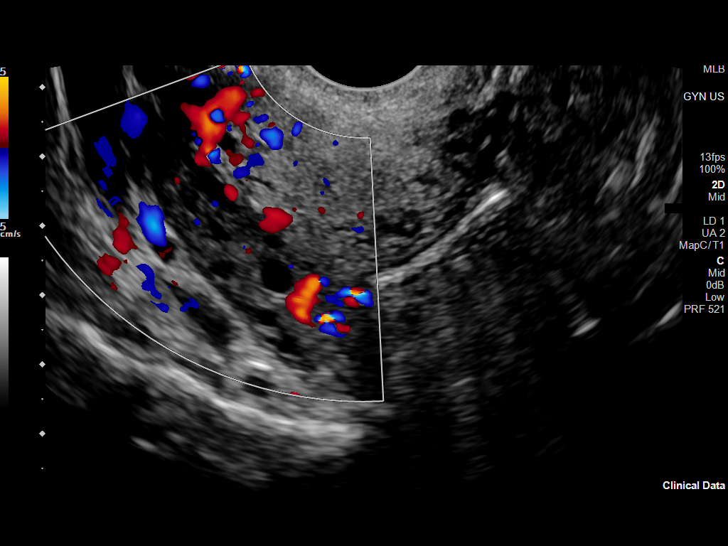
[im 51/61]
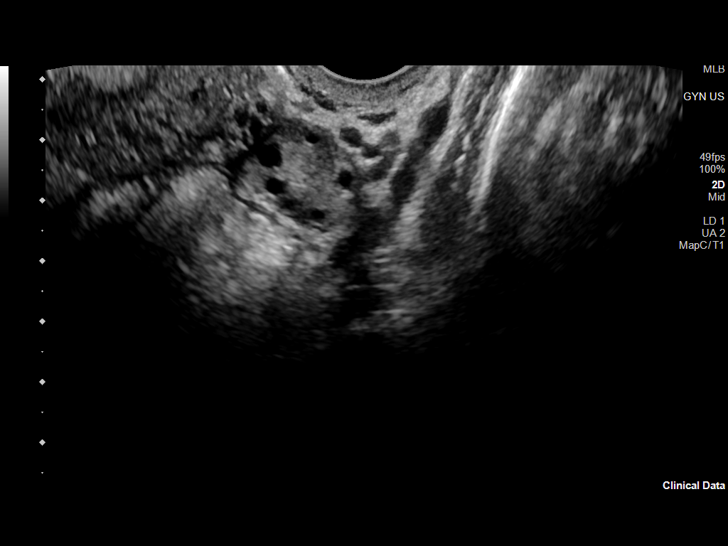
[im 56/61]
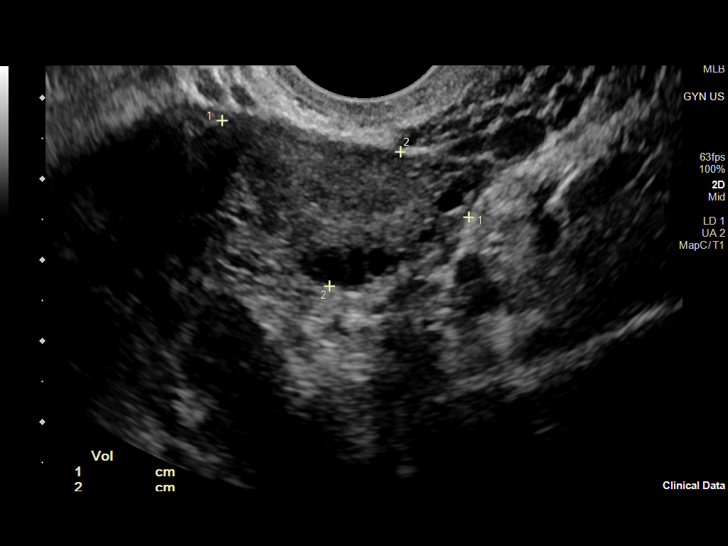
[im 61/61]
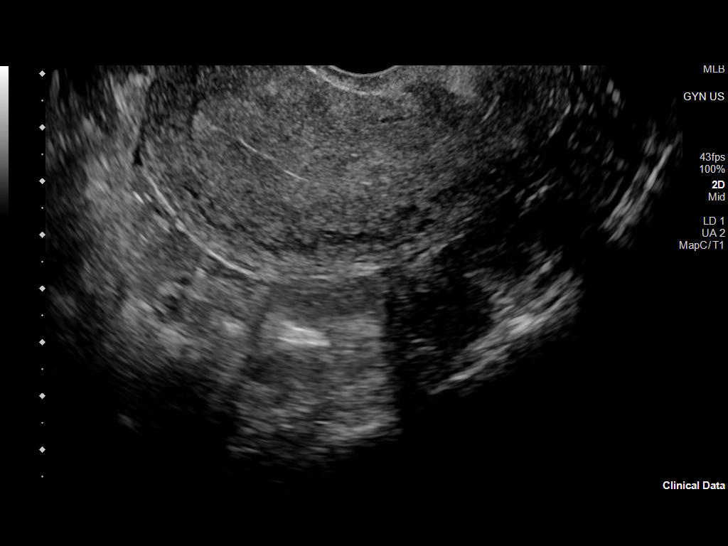

[14 of 25 positions shown; findings below may reference images not displayed]

FINDINGS: Uterus

Measurements: 8.0 x 4.8 x 5.3 cm = volume: 105.3 mL. Uterus is
anteverted. No discrete fibroid or other mass.

Endometrium

Thickness: 13.4 mm.  No focal abnormality visualized.

Right ovary

Measurements: 4.0 x 1.4 x 1.5 cm = volume: 4.6 mL. Normal
appearance/no adnexal mass.

Left ovary

Measurements: 3.3 x 1.9 x 2.9 cm = volume: 9.3 mL. Normal
appearance/no adnexal mass.

Other findings

No abnormal free fluid.
IMPRESSION: Normal pelvic ultrasound.

## 2024-03-28 NOTE — Telephone Encounter (Signed)
 Call [employer]  Call being placed to:: Patient         Call Reason (PT)  What is the reason for the call?: Appointment Reminder                                                 Appointment Reminder  Patient Reached?: No  Voicemail Left?: Yes

## 2024-03-29 NOTE — Progress Notes (Signed)
  Subjective:   Patient ID: Suzanne Garcia is a 23 y.o. female here for a TB skin test.  TB Placement    1. Why are you getting a TB test today? Select all that apply.: c. My school requires it   2. Have you ever received a BCG (bacille Calmette-Guerin) vaccination for TB? This vaccine is not widely used in the [place]  and is more common in countries where TB is common.: b. No 3. Have you ever received a positive test result for TB?: b. No 4. Have you ever had a serious reaction to a TB skin test?: b. No 5. Have you received a TB diagnosis or treatment for TB in the past?: b. No 6. Are you experiencing any of the following TB symptoms? Select all that apply.: g. No symptoms 7. Do you have a known allergy to phenol?: b. No 8. Have you had any of the following vaccines in the last 30 days? MMR (measles, mumps, rubella), rotavirus, smallpox, chickenpox or yellow fever: b. No 9. Have you had the flu, chickenpox, measles or mumps in the last 30 days?: b. No 10. Do you have a history of fainting after receiving a vaccine or injection? This can include fainting, feeling like you might faint, or feeling concerned about fainting.: b. No 11. Does the patient agree to be seated in a chair with arms or lie on the exam table?: a. Yes 12. Does the patient agree to be observed for 15 minutes post-procedure to assess for reaction or fainting risk?: b. No Any patient-supplied information was reviewed and discussed with the patient.: Suzanne Garcia as reviewed                                                             Objective:   Assessment/Plan:     PPD placed per [facility] Guidelines.  Patient Instructed to return in 48-72 hours for PPD reading.

## 2024-03-31 NOTE — Patient Instructions (Signed)
 Tuberculin Skin Test- Reading       What is tuberculosis (TB)?   Tuberculosis is a bacterial infection that affects the lungs. It may also be found in other parts of the body like the brain, kidneys, or spine. It is spread from person-to-person through the air. Latent TB infection is when the TB germs live in your body without causing symptoms. These "sleeping" germs cannot be passed on to anyone else.   Active TB disease is when the germs are active in your body, causing symptoms such as weakness, weight loss, fever, night sweats, and coughing (with or without blood). It is important to screen for this disease because people infected with TB can become seriously ill if they do not receive treatment.       What do I do if the tuberculin skin test is negative?   A negative skin test usually means you are not infected with tuberculosis. Keep in mind the following:   The test may be falsely negative if you were recently infected. It can take 2 to 10 weeks after exposure to TB for the skin test to be positive. If you find yourself in this situation, it is recommended to re-test again in 3 months.   Even with a negative skin test, if you have continued TB symptoms, you should be evaluated by your primary care provider.       What do I do if the tuberculin skin test is positive?   A positive skin test usually means you have a latent TB infection. It does not necessarily mean you have TB disease.   It is very important to see your primary care provider. Other tests, such as an x-ray or cultures, are needed to check for active disease.   Once you have a positive test, avoid having another one done. A positive result will remain that way for life. The skin reaction of any further TB skin tests could become increasingly severe.

## 2024-03-31 NOTE — Progress Notes (Signed)
 Subjective:    Patient ID:  The patient is a 23 y.o. for a TB skin test.          Objective:      Assessment/Plan:    PPD read completed and is NEGATIVE.  Results and recommendations reviewed with and acknowledged by the patient in accordance with [facility] Guidelines.

## 2024-07-26 ENCOUNTER — Emergency Department (HOSPITAL_COMMUNITY): Payer: Self-pay

## 2024-07-26 ENCOUNTER — Inpatient Hospital Stay (HOSPITAL_COMMUNITY)
Admission: EM | Admit: 2024-07-26 | Discharge: 2024-07-29 | DRG: 917 | Disposition: A | Discharge status: Other Acute Inpatient Hospital | Payer: Self-pay | Attending: Internal Medicine | Admitting: Internal Medicine

## 2024-07-26 DIAGNOSIS — J9601 Acute respiratory failure with hypoxia: Secondary | ICD-10-CM

## 2024-07-26 DIAGNOSIS — G9340 Encephalopathy, unspecified: Secondary | ICD-10-CM

## 2024-07-26 DIAGNOSIS — D649 Anemia, unspecified: Secondary | ICD-10-CM | POA: Diagnosis present

## 2024-07-26 DIAGNOSIS — T450X2A Poisoning by antiallergic and antiemetic drugs, intentional self-harm, initial encounter: Secondary | ICD-10-CM | POA: Diagnosis present

## 2024-07-26 DIAGNOSIS — T391X2A Poisoning by 4-Aminophenol derivatives, intentional self-harm, initial encounter: Principal | ICD-10-CM | POA: Diagnosis present

## 2024-07-26 DIAGNOSIS — Z79899 Other long term (current) drug therapy: Secondary | ICD-10-CM

## 2024-07-26 DIAGNOSIS — E872 Acidosis, unspecified: Secondary | ICD-10-CM | POA: Diagnosis present

## 2024-07-26 DIAGNOSIS — T50902A Poisoning by unspecified drugs, medicaments and biological substances, intentional self-harm, initial encounter: Principal | ICD-10-CM

## 2024-07-26 DIAGNOSIS — Z818 Family history of other mental and behavioral disorders: Secondary | ICD-10-CM

## 2024-07-26 DIAGNOSIS — R0689 Other abnormalities of breathing: Secondary | ICD-10-CM | POA: Diagnosis present

## 2024-07-26 DIAGNOSIS — E66811 Obesity, class 1: Secondary | ICD-10-CM | POA: Diagnosis present

## 2024-07-26 DIAGNOSIS — F319 Bipolar disorder, unspecified: Secondary | ICD-10-CM | POA: Diagnosis present

## 2024-07-26 DIAGNOSIS — Z6834 Body mass index (BMI) 34.0-34.9, adult: Secondary | ICD-10-CM

## 2024-07-26 DIAGNOSIS — Z886 Allergy status to analgesic agent status: Secondary | ICD-10-CM

## 2024-07-26 DIAGNOSIS — R569 Unspecified convulsions: Secondary | ICD-10-CM | POA: Diagnosis present

## 2024-07-26 DIAGNOSIS — G928 Other toxic encephalopathy: Secondary | ICD-10-CM | POA: Diagnosis present

## 2024-07-26 DIAGNOSIS — Z825 Family history of asthma and other chronic lower respiratory diseases: Secondary | ICD-10-CM

## 2024-07-26 DIAGNOSIS — Z833 Family history of diabetes mellitus: Secondary | ICD-10-CM

## 2024-07-26 DIAGNOSIS — F419 Anxiety disorder, unspecified: Secondary | ICD-10-CM | POA: Diagnosis present

## 2024-07-26 DIAGNOSIS — Z820 Family history of epilepsy and other diseases of the nervous system: Secondary | ICD-10-CM

## 2024-07-26 DIAGNOSIS — Z8249 Family history of ischemic heart disease and other diseases of the circulatory system: Secondary | ICD-10-CM

## 2024-07-26 LAB — CBC WITH DIFFERENTIAL/PLATELET
Abs Immature Granulocytes: 0.04 K/uL (ref 0.00–0.07)
Basophils Absolute: 0 K/uL (ref 0.0–0.1)
Basophils Relative: 1 %
Eosinophils Absolute: 0 K/uL (ref 0.0–0.5)
Eosinophils Relative: 1 %
HCT: 31.3 % — ABNORMAL LOW (ref 36.0–46.0)
Hemoglobin: 10.1 g/dL — ABNORMAL LOW (ref 12.0–15.0)
Immature Granulocytes: 1 %
Lymphocytes Relative: 24 %
Lymphs Abs: 1.8 K/uL (ref 0.7–4.0)
MCH: 28.9 pg (ref 26.0–34.0)
MCHC: 32.3 g/dL (ref 30.0–36.0)
MCV: 89.4 fL (ref 80.0–100.0)
Monocytes Absolute: 0.5 K/uL (ref 0.1–1.0)
Monocytes Relative: 6 %
Neutro Abs: 5.1 K/uL (ref 1.7–7.7)
Neutrophils Relative %: 67 %
Platelets: 305 K/uL (ref 150–400)
RBC: 3.5 MIL/uL — ABNORMAL LOW (ref 3.87–5.11)
RDW: 12.8 % (ref 11.5–15.5)
WBC: 7.5 K/uL (ref 4.0–10.5)
nRBC: 0 % (ref 0.0–0.2)

## 2024-07-26 LAB — I-STAT CHEM 8, ED
BUN: 8 mg/dL (ref 6–20)
Calcium, Ion: 0.97 mmol/L — ABNORMAL LOW (ref 1.15–1.40)
Chloride: 106 mmol/L (ref 98–111)
Creatinine, Ser: 1 mg/dL (ref 0.44–1.00)
Glucose, Bld: 129 mg/dL — ABNORMAL HIGH (ref 70–99)
HCT: 33 % — ABNORMAL LOW (ref 36.0–46.0)
Hemoglobin: 11.2 g/dL — ABNORMAL LOW (ref 12.0–15.0)
Potassium: 3.3 mmol/L — ABNORMAL LOW (ref 3.5–5.1)
Sodium: 141 mmol/L (ref 135–145)
TCO2: 22 mmol/L (ref 22–32)

## 2024-07-26 LAB — I-STAT ARTERIAL BLOOD GAS, ED
Acid-base deficit: 6 mmol/L — ABNORMAL HIGH (ref 0.0–2.0)
Bicarbonate: 22.3 mmol/L (ref 20.0–28.0)
Calcium, Ion: 1.07 mmol/L — ABNORMAL LOW (ref 1.15–1.40)
HCT: 33 % — ABNORMAL LOW (ref 36.0–46.0)
Hemoglobin: 11.2 g/dL — ABNORMAL LOW (ref 12.0–15.0)
O2 Saturation: 100 %
Patient temperature: 97.9
Potassium: 3.4 mmol/L — ABNORMAL LOW (ref 3.5–5.1)
Sodium: 139 mmol/L (ref 135–145)
TCO2: 24 mmol/L (ref 22–32)
pCO2 arterial: 53 mmHg — ABNORMAL HIGH (ref 32–48)
pH, Arterial: 7.23 — ABNORMAL LOW (ref 7.35–7.45)
pO2, Arterial: 469 mmHg — ABNORMAL HIGH (ref 83–108)

## 2024-07-26 LAB — COMPREHENSIVE METABOLIC PANEL WITH GFR
ALT: 12 U/L (ref 0–44)
AST: 19 U/L (ref 15–41)
Albumin: 3.2 g/dL — ABNORMAL LOW (ref 3.5–5.0)
Alkaline Phosphatase: 39 U/L (ref 38–126)
Anion gap: 10 (ref 5–15)
BUN: 8 mg/dL (ref 6–20)
CO2: 21 mmol/L — ABNORMAL LOW (ref 22–32)
Calcium: 7 mg/dL — ABNORMAL LOW (ref 8.9–10.3)
Chloride: 107 mmol/L (ref 98–111)
Creatinine, Ser: 0.52 mg/dL (ref 0.44–1.00)
GFR, Estimated: >60 mL/min (ref >60)
Glucose, Bld: 126 mg/dL — ABNORMAL HIGH (ref 70–99)
Potassium: 3.2 mmol/L — ABNORMAL LOW (ref 3.5–5.1)
Sodium: 138 mmol/L (ref 135–145)
Total Bilirubin: 0.3 mg/dL (ref 0.0–1.2)
Total Protein: 5.6 g/dL — ABNORMAL LOW (ref 6.5–8.1)

## 2024-07-26 LAB — MAGNESIUM: Magnesium: 1.5 mg/dL — ABNORMAL LOW (ref 1.7–2.4)

## 2024-07-26 LAB — CBG MONITORING, ED: Glucose-Capillary: 82 mg/dL (ref 70–99)

## 2024-07-26 MED ORDER — CHARCOAL ACTIVATED PO LIQD
50.0000 g | Freq: Once | ORAL | Status: AC
  Administered 2024-07-26: 50 g via ORAL
  Filled 2024-07-26: qty 240

## 2024-07-26 MED ORDER — CHARCOAL ACTIVATED PO LIQD
50.0000 g | Freq: Once | ORAL | Status: AC
  Administered 2024-07-27: 50 g via ORAL
  Filled 2024-07-26: qty 240

## 2024-07-26 MED ORDER — ACETYLCYSTEINE LOAD VIA INFUSION
150.0000 mg/kg | Freq: Once | INTRAVENOUS | Status: AC
  Administered 2024-07-26: 8640 mg via INTRAVENOUS
  Filled 2024-07-26: qty 284

## 2024-07-26 MED ORDER — DEXTROSE 5 % IV SOLN
15.0000 mg/kg/h | INTRAVENOUS | Status: AC
  Administered 2024-07-27 (×2): 15 mg/kg/h via INTRAVENOUS
  Filled 2024-07-26 (×2): qty 90

## 2024-07-26 MED ORDER — PROPOFOL 1000 MG/100ML IV EMUL
INTRAVENOUS | Status: AC
  Administered 2024-07-26: 35 ug/kg/min via INTRAVENOUS
  Filled 2024-07-26: qty 100

## 2024-07-26 MED ORDER — LORAZEPAM 2 MG/ML IJ SOLN
INTRAMUSCULAR | Status: AC
  Filled 2024-07-26: qty 1

## 2024-07-26 NOTE — Progress Notes (Signed)
 Rt at bedside to assist with intubation. ED provider intubated, possible color changed noted. Patients vitals remained stable throughout procedure. RT will continue to monitor.

## 2024-07-26 NOTE — ED Provider Notes (Signed)
 [name] EMERGENCY DEPARTMENT AT [facility] Provider Note   CSN: [id number] Arrival date & time: 07/26/24  2223     Patient presents with: Ingestion   Suzanne Garcia is a 23 y.o. female.   This is a 23 year old female who is here today after an intentional ingestion of Tylenol  and Benadryl .  Per EMS report, mother found the patient today surrounded by pill bottles.  Mother reports that the patient had been depressed recently.  When EMS arrived, patient was lethargic, minimally responsive.  She had a seizure in the ambulance, received 5 of IM Versed.   Ingestion       Prior to Admission medications   Medication Sig Start Date End Date Taking? Authorizing Provider  hydrOXYzine  (ATARAX ) 25 MG tablet Take 1 tablet (25 mg total) by mouth 3 (three) times daily as needed for anxiety. 01/11/22   Penn, Reymundo, NP  medroxyPROGESTERone  (DEPO-PROVERA ) 150 MG/ML injection Inject 1 mL (150 mg total) into the muscle every 3 (three) months. 05/20/22   Zina Jerilynn LABOR, MD  metroNIDAZOLE  (FLAGYL ) 500 MG tablet Take 1 tablet (500 mg total) by mouth 2 (two) times daily. 05/24/22   Constant, Peggy, MD  mirtazapine  (REMERON  SOL-TAB) 15 MG disintegrating tablet Take 0.5 tablets (7.5 mg total) by mouth at bedtime. For depression.sleep 11/30/21   Sammy Reymundo, NP  traZODone  (DESYREL ) 50 MG tablet Take 1 tablet (50 mg total) by mouth at bedtime as needed for sleep. 01/11/22   Penn, Reymundo, NP  valACYclovir  (VALTREX ) 1000 MG tablet Take 1 tablet (1,000 mg total) by mouth 2 (two) times daily. Take for 5 days with each outbreak. 01/07/22   Rudy Carlin LABOR, MD    Allergies: Ibuprofen     Review of Systems  Updated Vital Signs BP 134/80   Pulse 85   Temp 97.9 F (36.6 C) (Axillary)   Resp (!) 21   Ht 4' 10 (1.473 m)   Wt 57.6 kg   SpO2 100%   BMI 26.54 kg/m   Physical Exam Vitals and nursing note reviewed.  Constitutional:      Appearance: She is ill-appearing.  HENT:     Head:  Normocephalic and atraumatic.     Nose: Nose normal.  Eyes:     Extraocular Movements: Extraocular movements intact.     Pupils: Pupils are equal, round, and reactive to light.  Cardiovascular:     Rate and Rhythm: Normal rate.  Pulmonary:     Comments: Sonorous respirations Abdominal:     General: Abdomen is flat. There is no distension.     Palpations: Abdomen is soft.  Musculoskeletal:     Cervical back: Normal range of motion.  Skin:    Comments: Dry axilla bilaterally  Neurological:     Comments: Patient unable to follow commands.  Does withdraw to painful stimuli  Psychiatric:     Comments: Unable to assess     (all labs ordered are listed, but only abnormal results are displayed) Labs Reviewed  ACETAMINOPHEN  LEVEL  COMPREHENSIVE METABOLIC PANEL WITH GFR  CBC WITH DIFFERENTIAL/PLATELET  SALICYLATE LEVEL  MAGNESIUM   ETHANOL  HCG, QUANTITATIVE, PREGNANCY  RAPID URINE DRUG SCREEN, HOSP PERFORMED  I-STAT ARTERIAL BLOOD GAS, ED  I-STAT CHEM 8, ED  CBG MONITORING, ED    EKG: EKG Interpretation Date/Time:  Thursday July 26 2024 22:37:42 EDT Ventricular Rate:  88 PR Interval:  151 QRS Duration:  95 QT Interval:  422 QTC Calculation: 511 R Axis:   57  Text Interpretation:  Sinus rhythm Borderline T abnormalities, inferior leads Prolonged QT interval Confirmed by Mannie Pac [phone]) on 07/26/2024 10:51:10 PM  Radiology: DG Chest Portable 1 View Result Date: 07/26/2024 CLINICAL DATA:  Intubated EXAM: PORTABLE CHEST 1 VIEW COMPARISON:  07/09/2019 FINDINGS: Endotracheal tube tip about a cm superior to carina. Enteric tube tip below the diaphragm but incompletely assessed. No acute airspace disease or effusion. Borderline cardiac enlargement. No pneumothorax IMPRESSION: Endotracheal tube tip about a cm superior to carina. Enteric tube tip below the diaphragm but incompletely assessed. No acute airspace disease. Electronically Signed   By: Luke Bun M.D.   On:  07/26/2024 23:07     .Critical Care  Performed by: Mannie Pac DASEN, DO Authorized by: Mannie Pac DASEN, DO   Critical care provider statement:    Critical care time (minutes):  45   Critical care was necessary to treat or prevent imminent or life-threatening deterioration of the following conditions:  Toxidrome   Critical care was time spent personally by me on the following activities:  Development of treatment plan with patient or surrogate, discussions with consultants, evaluation of patient's response to treatment, examination of patient, ordering and review of laboratory studies, ordering and review of radiographic studies, ordering and performing treatments and interventions, pulse oximetry, re-evaluation of patient's condition and review of old charts Procedure Name: Intubation Date/Time: 07/26/2024 11:26 PM  Performed by: Mannie Pac DASEN, DOPre-anesthesia Checklist: Patient identified, Patient being monitored, Emergency Drugs available, Timeout performed and Suction available Oxygen Delivery Method: Non-rebreather mask Preoxygenation: Pre-oxygenation with 100% oxygen Induction Type: Rapid sequence Ventilation: Mask ventilation without difficulty Laryngoscope Size: Glidescope and 3 Grade View: Grade I Tube size: 7.5 mm Number of attempts: 1 Placement Confirmation: ETT inserted through vocal cords under direct vision, CO2 detector and Breath sounds checked- equal and bilateral Secured at: 20 cm Tube secured with: ETT holder       Medications Ordered in the ED  propofol (DIPRIVAN) 1000 MG/100ML infusion (has no administration in time range)  LORazepam (ATIVAN) 2 MG/ML injection (has no administration in time range)  charcoal activated (NO SORBITOL) (ACTIDOSE-AQUA) suspension 50 g (has no administration in time range)  acetylcysteine (ACETADOTE) 30.5 mg/mL load via infusion 8,640 mg (has no administration in time range)  acetylcysteine (ACETADOTE) 18,000 mg in dextrose 5  % 590 mL (30.5085 mg/mL) infusion (has no administration in time range)                                    Medical Decision Making 23 year old female who is here today after an intentional ingestion of Tylenol  and Benadryl .  Plan-upon arrival to the emergency room, I was concerned about the patient's ability to protect her airway.  Respirations were sonorous, she had already had 1 seizure which EMS was able to treat.  It sounds that the patient ingested her pills about 40 minutes prior to ED arrival.  Protect airway, and facilitate activated charcoal, made decision to intubate patient.  Tube in appropriate position.  Patient with a blood sugar of 96.  ABG ordered.  Estimate is the patient ingested 30 g of Tylenol .  Will start her on N-acetylcysteine.  Reached out to poison control, recommendations appreciated.  Normal intervals on EKG.  Per pharmacy review, patient also has access to mirtazapine , trazodone  and hydroxyzine .  These pill bottles were not found with the patient.  Patient mated to intensivist.   Amount and/or Complexity of Data  Reviewed Labs: ordered. Radiology: ordered.  Risk OTC drugs. Decision regarding hospitalization.        Final diagnoses:  Intentional overdose, initial encounter [facility])    ED Discharge Orders     None          Mannie Fairy DASEN, DO 07/26/24 2329

## 2024-07-26 NOTE — ED Notes (Addendum)
 Spoke with Danielle with poison control. Estimated ingestion time at 2130.    Ingested 30.5 g tylenol   Suggest giving Activated charcoal in NG 1g/kg; max 75 g  4 hour post ingestion tylenol  now and 0130. If any are higher than 25 give acetylcysteine   Needs BMP, ASP, ETOH, magnesium , trend EKG every 4 hours.  With qtc greater than 500, give potassium if K level is less than 4; give magnesium  if level is less than 2    High risk for Urinary retention and seizures  Recommendations reported to Ozell, Charity fundraiser and Dr.Stevens

## 2024-07-26 NOTE — H&P (Incomplete)
 NAME:  Suzanne Garcia, MRN:  [id number], DOB:  Feb 12, 2001, LOS: 0 ADMISSION DATE:  07/26/2024, CONSULTATION DATE: 10/23 REFERRING MD: Dr. Mannie, CHIEF COMPLAINT: Altered mental status  History of Present Illness:  23 year old female with past medical history as below.  Mother reports she has been under increased stress recently due to work and school.  On 10/23 there was a stressful event regarding her boyfriend, but she was acting pretty normally.  The evening hours she was seen to be acting strange and was shaking violently raising concern for seizure of which she has no history.  EMS was called.  Upon their arrival the patient's mother realized that a bottle of Tylenol  and bottle of Tylenol  PM were both empty.  Her mom estimates that she could have taken as many as 50 Tylenol  PM tablets and 15 Tylenol  Extra Strength tablets.  Upon EMS arrival the patient was lethargic and minimally responsive.  She had a witnessed seizure in the ambulance and received 5 mg of IM Versed.  Upon arrival to [facility] emergency department she was intubated for airway protection.  N-acetylcysteine was initiated.  Poison control was called and made recommendations for labs.  PCCM was asked to admit  Pertinent  Medical History   has a past medical history of Anxiety, Bipolar disorder (HCC), and Depression.   Significant Hospital Events: Including procedures, antibiotic start and stop dates in addition to other pertinent events   10/23: Admitted following intentional Tylenol  overdose of an estimated 30 g.  Interim History / Subjective:    Objective    Blood pressure 134/80, pulse 85, temperature 97.9 F (36.6 C), temperature source Axillary, resp. rate (!) 21, height 4' 10 (1.473 m), weight 57.6 kg, SpO2 100%.    Vent Mode: PRVC FiO2 (%):  [40 %-100 %] 40 % Set Rate:  [20 bmp] 20 bmp Vt Set:  [330 mL-480 mL] 480 mL PEEP:  [5 cmH20] 5 cmH20 Plateau Pressure:  [13 cmH20] 13 cmH20  No intake or output  data in the 24 hours ending 07/26/24 2342 Filed Weights   07/26/24 2226  Weight: 57.6 kg    Examination: General: Young adult female in no acute distress on mechanical ventilator HENT: Normocephalic, atraumatic, PERRL, no JVD Lungs: Clear bilateral breath sounds Cardiovascular: Regular rate and rhythm Abdomen: Soft, nondistended Extremities: No acute deformity or edema Neuro: Sedated   Resolved problem list   Assessment and Plan   Intentional overdose of acetaminophen  and diphenhydramine  - Admit to ICU for close monitoring - Activated charcoal being given now - N-acetylcysteine initiated in ED will continue - Acetaminophen  level, alcohol level, and LFTs pending - Low threshold to seek transfer to transplant center if LFTs climb - Appreciate poison control recs.  - Keep K > 4 and Mag > 2 - Bladder scans - Repeat EKG in AM  Acute encephalopathy secondary to toxic ingestion - Check EEG - Supportive care - ETT, Vent for airway protection  Respiratory insufficiency secondary to above - Full vent support - 8cc/kg was only 330 mL and she was hypoventilating despite increased rate. Peak pressures were low.  Vt increased above typical threshold.  - Propofol and PRN fentanyl for RASS goal 0 to -1.    Labs   CBC: Recent Labs  Lab 07/26/24 2310 07/26/24 2321 07/26/24 2330  WBC 7.5  --   --   NEUTROABS 5.1  --   --   HGB 10.1* 11.2* 11.2*  HCT 31.3* 33.0* 33.0*  MCV 89.4  --   --  PLT 305  --   --     Basic Metabolic Panel: Recent Labs  Lab 07/26/24 2321 07/26/24 2330  NA 141 139  K 3.3* 3.4*  CL 106  --   GLUCOSE 129*  --   BUN 8  --   CREATININE 1.00  --    GFR: Estimated Creatinine Clearance: 65.2 mL/min (by C-G formula based on SCr of 1 mg/dL). Recent Labs  Lab 07/26/24 2310  WBC 7.5    Liver Function Tests: No results for input(s): AST, ALT, ALKPHOS, BILITOT, PROT, ALBUMIN in the last 168 hours. No results for input(s): LIPASE,  AMYLASE in the last 168 hours. No results for input(s): AMMONIA in the last 168 hours.  ABG    Component Value Date/Time   PHART 7.230 (L) 07/26/2024 2330   PCO2ART 53.0 (H) 07/26/2024 2330   PO2ART 469 (H) 07/26/2024 2330   HCO3 22.3 07/26/2024 2330   TCO2 24 07/26/2024 2330   ACIDBASEDEF 6.0 (H) 07/26/2024 2330   O2SAT 100 07/26/2024 2330     Coagulation Profile: No results for input(s): INR, PROTIME in the last 168 hours.  Cardiac Enzymes: No results for input(s): CKTOTAL, CKMB, CKMBINDEX, TROPONINI in the last 168 hours.  HbA1C: Hgb A1c MFr Bld  Date/Time Value Ref Range Status  12/02/2020 06:24 AM 5.0 4.8 - 5.6 % Final    Comment:    (NOTE) Pre diabetes:          5.7%-6.4%  Diabetes:              >6.4%  Glycemic control for   <7.0% adults with diabetes   05/27/2018 06:08 AM 5.1 4.8 - 5.6 % Final    Comment:    (NOTE) Pre diabetes:          5.7%-6.4% Diabetes:              >6.4% Glycemic control for   <7.0% adults with diabetes     CBG: Recent Labs  Lab 07/26/24 2310  GLUCAP 82    Review of Systems:   ***  Past Medical History:  She,  has a past medical history of Anxiety, Bipolar disorder (HCC), and Depression.   Surgical History:   Past Surgical History:  Procedure Laterality Date  . NO PAST SURGERIES       Social History:   reports that she has never smoked. She has never used smokeless tobacco. She reports that she does not currently use alcohol. She reports that she does not currently use drugs.   Family History:  Her family history includes ADD / ADHD in her sister; Asthma in her mother; Bipolar disorder in her maternal grandfather, maternal grandmother, and sister; Depression in her mother; Diabetes in her maternal grandmother and mother; Heart disease in her maternal grandmother; Mental illness in her father; Miscarriages / Stillbirths in her mother; Multiple sclerosis in her maternal grandfather; Obesity in her mother.    Allergies Allergies  Allergen Reactions  . Ibuprofen      Patient states that it causes stomach thinning      Home Medications  Prior to Admission medications   Medication Sig Start Date End Date Taking? Authorizing Provider  hydrOXYzine  (ATARAX ) 25 MG tablet Take 1 tablet (25 mg total) by mouth 3 (three) times daily as needed for anxiety. 01/11/22   Penn, Reymundo, NP  medroxyPROGESTERone  (DEPO-PROVERA ) 150 MG/ML injection Inject 1 mL (150 mg total) into the muscle every 3 (three) months. 05/20/22   Zina Jerilynn LABOR, MD  metroNIDAZOLE  (FLAGYL ) 500 MG tablet Take 1 tablet (500 mg total) by mouth 2 (two) times daily. 05/24/22   Constant, Peggy, MD  mirtazapine  (REMERON  SOL-TAB) 15 MG disintegrating tablet Take 0.5 tablets (7.5 mg total) by mouth at bedtime. For depression.sleep 11/30/21   Sammy Molder, NP  traZODone  (DESYREL ) 50 MG tablet Take 1 tablet (50 mg total) by mouth at bedtime as needed for sleep. 01/11/22   Penn, Molder, NP  valACYclovir  (VALTREX ) 1000 MG tablet Take 1 tablet (1,000 mg total) by mouth 2 (two) times daily. Take for 5 days with each outbreak. 01/07/22   Rudy Carlin LABOR, MD     Critical care time: ***

## 2024-07-26 NOTE — ED Notes (Signed)
 Chief Complaint  Patient presents with   Ingestion   Past Medical History:  Diagnosis Date   Anxiety    Bipolar disorder (HCC)    Depression    BP 134/80   Pulse 85   Temp 97.9 F (36.6 C) (Axillary)   Resp (!) 21   Ht 4' 10 (1.473 m)   Wt 57.6 kg   SpO2 100%   BMI 26.54 kg/m   Pt arrived via EMS, pt took 30.5G tylenol  pm, pt was brought into room and MD determined that she would need to be intubated,   Pushed 100 of Rocuronium at 2226 Pushed 10 of Etomidate at 2227  Pt rec'd 2mg  of ativan at 2231  Pt is on a propofol drip at 30mcg/min started at 2233

## 2024-07-26 NOTE — H&P (Signed)
 NAME:  Suzanne Garcia, MRN:  [id number], DOB:  [birth year], LOS: 0 ADMISSION DATE:  07/26/2024, CONSULTATION DATE: 10/23 REFERRING MD: Dr. Mannie, CHIEF COMPLAINT: Altered mental status  History of Present Illness:  23 year old female with past medical history as below.  Mother reports she has been under increased stress recently due to work and school.  On 10/23 there was a stressful event regarding her boyfriend, but she was acting pretty normally.  The evening hours she was seen to be acting strange and was shaking violently raising concern for seizure of which she has no history.  EMS was called.  Upon their arrival the patient's mother realized that a bottle of Tylenol  and bottle of Tylenol  PM were both empty.  Her mom estimates that she could have taken as many as 50 Tylenol  PM tablets and 15 Tylenol  Extra Strength tablets.  Upon EMS arrival the patient was lethargic and minimally responsive.  She had a witnessed seizure in the ambulance and received 5 mg of IM Versed.  Upon arrival to [facility] emergency department she was intubated for airway protection.  N-acetylcysteine was initiated.  Poison control was called and made recommendations for labs.  PCCM was asked to admit  Pertinent  Medical History   has a past medical history of Anxiety, Bipolar disorder (HCC), and Depression.   Significant Hospital Events: Including procedures, antibiotic start and stop dates in addition to other pertinent events   10/23: Admitted following intentional Tylenol  overdose of an estimated 30 g.  Interim History / Subjective:    Objective    Blood pressure 134/80, pulse 85, temperature 97.9 F (36.6 C), temperature source Axillary, resp. rate (!) 21, height 4' 10 (1.473 m), weight 57.6 kg, SpO2 100%.    Vent Mode: PRVC FiO2 (%):  [40 %-100 %] 40 % Set Rate:  [20 bmp] 20 bmp Vt Set:  [330 mL-480 mL] 480 mL PEEP:  [5 cmH20] 5 cmH20 Plateau Pressure:  [13 cmH20] 13 cmH20  No intake or output  data in the 24 hours ending 07/26/24 2342 Filed Weights   07/26/24 2226  Weight: 57.6 kg    Examination: General: Young adult female in no acute distress on mechanical ventilator HENT: Normocephalic, atraumatic, PERRL, no JVD Lungs: Clear bilateral breath sounds Cardiovascular: Regular rate and rhythm Abdomen: Soft, nondistended Extremities: No acute deformity or edema Neuro: Sedated   Resolved problem list   Assessment and Plan   Intentional overdose of acetaminophen  and diphenhydramine  - Admit to ICU for close monitoring - Activated charcoal being given now - N-acetylcysteine initiated in ED will continue - Acetaminophen  level, salicylate, alcohol level, and LFTs pending. UDS pending - Low threshold to seek transfer to transplant center if LFTs climb - Appreciate poison control recs.  - Keep K > 4 and Mag > 2 - Bladder scans - Repeat EKG in AM - Home medications list mirtazapine , hydroxyzine , and trazodone . Mother was not aware of any of these an did not find those pill bottles with the tylenol .   Acute encephalopathy secondary to toxic ingestion - Check EEG - Supportive care - ETT, Vent for airway protection  Respiratory insufficiency secondary to above - Full vent support - 8cc/kg was only 330 mL and she was hypoventilating despite increased rate. Peak pressures were low.  Vt increased above typical threshold.  - Propofol and PRN fentanyl for RASS goal 0 to -1.    Labs   CBC: Recent Labs  Lab 07/26/24 2310 07/26/24 2321 07/26/24 2330  WBC  7.5  --   --   NEUTROABS 5.1  --   --   HGB 10.1* 11.2* 11.2*  HCT 31.3* 33.0* 33.0*  MCV 89.4  --   --   PLT 305  --   --     Basic Metabolic Panel: Recent Labs  Lab 07/26/24 2321 07/26/24 2330  NA 141 139  K 3.3* 3.4*  CL 106  --   GLUCOSE 129*  --   BUN 8  --   CREATININE 1.00  --    GFR: Estimated Creatinine Clearance: 65.2 mL/min (by C-G formula based on SCr of 1 mg/dL). Recent Labs  Lab  07/26/24 2310  WBC 7.5    Liver Function Tests: No results for input(s): AST, ALT, ALKPHOS, BILITOT, PROT, ALBUMIN in the last 168 hours. No results for input(s): LIPASE, AMYLASE in the last 168 hours. No results for input(s): AMMONIA in the last 168 hours.  ABG    Component Value Date/Time   PHART 7.230 (L) 07/26/2024 2330   PCO2ART 53.0 (H) 07/26/2024 2330   PO2ART 469 (H) 07/26/2024 2330   HCO3 22.3 07/26/2024 2330   TCO2 24 07/26/2024 2330   ACIDBASEDEF 6.0 (H) 07/26/2024 2330   O2SAT 100 07/26/2024 2330     Coagulation Profile: No results for input(s): INR, PROTIME in the last 168 hours.  Cardiac Enzymes: No results for input(s): CKTOTAL, CKMB, CKMBINDEX, TROPONINI in the last 168 hours.  HbA1C: Hgb A1c MFr Bld  Date/Time Value Ref Range Status  12/02/2020 06:24 AM 5.0 4.8 - 5.6 % Final    Comment:    (NOTE) Pre diabetes:          5.7%-6.4%  Diabetes:              >6.4%  Glycemic control for   <7.0% adults with diabetes   05/27/2018 06:08 AM 5.1 4.8 - 5.6 % Final    Comment:    (NOTE) Pre diabetes:          5.7%-6.4% Diabetes:              >6.4% Glycemic control for   <7.0% adults with diabetes     CBG: Recent Labs  Lab 07/26/24 2310  GLUCAP 82    Review of Systems:   Patient is encephalopathic and/or intubated; therefore, history has been obtained from chart review.    Past Medical History:  She,  has a past medical history of Anxiety, Bipolar disorder (HCC), and Depression.   Surgical History:   Past Surgical History:  Procedure Laterality Date   NO PAST SURGERIES       Social History:   reports that she has never smoked. She has never used smokeless tobacco. She reports that she does not currently use alcohol. She reports that she does not currently use drugs.   Family History:  Her family history includes ADD / ADHD in her sister; Asthma in her mother; Bipolar disorder in her maternal grandfather,  maternal grandmother, and sister; Depression in her mother; Diabetes in her maternal grandmother and mother; Heart disease in her maternal grandmother; Mental illness in her father; Miscarriages / Stillbirths in her mother; Multiple sclerosis in her maternal grandfather; Obesity in her mother.   Allergies Allergies  Allergen Reactions   Ibuprofen      Patient states that it causes stomach thinning      Home Medications  Prior to Admission medications   Medication Sig Start Date End Date Taking? Authorizing Provider  hydrOXYzine  (ATARAX ) 25 MG  tablet Take 1 tablet (25 mg total) by mouth 3 (three) times daily as needed for anxiety. 01/11/22   Penn, Reymundo, NP  medroxyPROGESTERone  (DEPO-PROVERA ) 150 MG/ML injection Inject 1 mL (150 mg total) into the muscle every 3 (three) months. 05/20/22   Zina Jerilynn LABOR, MD  metroNIDAZOLE  (FLAGYL ) 500 MG tablet Take 1 tablet (500 mg total) by mouth 2 (two) times daily. 05/24/22   Constant, Peggy, MD  mirtazapine  (REMERON  SOL-TAB) 15 MG disintegrating tablet Take 0.5 tablets (7.5 mg total) by mouth at bedtime. For depression.sleep 11/30/21   Sammy Reymundo, NP  traZODone  (DESYREL ) 50 MG tablet Take 1 tablet (50 mg total) by mouth at bedtime as needed for sleep. 01/11/22   Penn, Reymundo, NP  valACYclovir  (VALTREX ) 1000 MG tablet Take 1 tablet (1,000 mg total) by mouth 2 (two) times daily. Take for 5 days with each outbreak. 01/07/22   Rudy Carlin LABOR, MD     Critical care time: 46 minutes     Deward Eastern, AGACNP-BC [facility]  See Amion for personal pager PCCM on call pager [phone] until 7pm. Please call Elink 7p-7a. [phone]  07/27/2024 12:14 AM

## 2024-07-27 ENCOUNTER — Encounter (HOSPITAL_COMMUNITY): Payer: Self-pay

## 2024-07-27 ENCOUNTER — Inpatient Hospital Stay: Payer: Self-pay

## 2024-07-27 ENCOUNTER — Other Ambulatory Visit: Payer: Self-pay

## 2024-07-27 ENCOUNTER — Inpatient Hospital Stay (HOSPITAL_COMMUNITY): Payer: Self-pay

## 2024-07-27 DIAGNOSIS — T50902A Poisoning by unspecified drugs, medicaments and biological substances, intentional self-harm, initial encounter: Secondary | ICD-10-CM

## 2024-07-27 DIAGNOSIS — R4182 Altered mental status, unspecified: Secondary | ICD-10-CM

## 2024-07-27 DIAGNOSIS — T391X2A Poisoning by 4-Aminophenol derivatives, intentional self-harm, initial encounter: Secondary | ICD-10-CM | POA: Diagnosis present

## 2024-07-27 DIAGNOSIS — R569 Unspecified convulsions: Secondary | ICD-10-CM

## 2024-07-27 DIAGNOSIS — J9600 Acute respiratory failure, unspecified whether with hypoxia or hypercapnia: Secondary | ICD-10-CM

## 2024-07-27 DIAGNOSIS — F419 Anxiety disorder, unspecified: Secondary | ICD-10-CM

## 2024-07-27 DIAGNOSIS — G928 Other toxic encephalopathy: Secondary | ICD-10-CM

## 2024-07-27 DIAGNOSIS — F32A Depression, unspecified: Secondary | ICD-10-CM

## 2024-07-27 LAB — CBC
HCT: 36.3 % (ref 36.0–46.0)
Hemoglobin: 11.8 g/dL — ABNORMAL LOW (ref 12.0–15.0)
MCH: 28.8 pg (ref 26.0–34.0)
MCHC: 32.5 g/dL (ref 30.0–36.0)
MCV: 88.5 fL (ref 80.0–100.0)
Platelets: 351 K/uL (ref 150–400)
RBC: 4.1 MIL/uL (ref 3.87–5.11)
RDW: 12.7 % (ref 11.5–15.5)
WBC: 8.4 K/uL (ref 4.0–10.5)
nRBC: 0 % (ref 0.0–0.2)

## 2024-07-27 LAB — I-STAT ARTERIAL BLOOD GAS, ED
Acid-base deficit: 10 mmol/L — ABNORMAL HIGH (ref 0.0–2.0)
Bicarbonate: 17.4 mmol/L — ABNORMAL LOW (ref 20.0–28.0)
Calcium, Ion: 1.03 mmol/L — ABNORMAL LOW (ref 1.15–1.40)
HCT: 35 % — ABNORMAL LOW (ref 36.0–46.0)
Hemoglobin: 11.9 g/dL — ABNORMAL LOW (ref 12.0–15.0)
O2 Saturation: 99 %
Patient temperature: 97.9
Potassium: 3.7 mmol/L (ref 3.5–5.1)
Sodium: 140 mmol/L (ref 135–145)
TCO2: 19 mmol/L — ABNORMAL LOW (ref 22–32)
pCO2 arterial: 40.6 mmHg (ref 32–48)
pH, Arterial: 7.239 — ABNORMAL LOW (ref 7.35–7.45)
pO2, Arterial: 162 mmHg — ABNORMAL HIGH (ref 83–108)

## 2024-07-27 LAB — BASIC METABOLIC PANEL WITH GFR
Anion gap: 14 (ref 5–15)
BUN: 8 mg/dL (ref 6–20)
CO2: 18 mmol/L — ABNORMAL LOW (ref 22–32)
Calcium: 7.4 mg/dL — ABNORMAL LOW (ref 8.9–10.3)
Chloride: 105 mmol/L (ref 98–111)
Creatinine, Ser: 0.52 mg/dL (ref 0.44–1.00)
GFR, Estimated: >60 mL/min (ref >60)
Glucose, Bld: 226 mg/dL — ABNORMAL HIGH (ref 70–99)
Potassium: 3.8 mmol/L (ref 3.5–5.1)
Sodium: 137 mmol/L (ref 135–145)

## 2024-07-27 LAB — POCT I-STAT 7, (LYTES, BLD GAS, ICA,H+H)
Acid-base deficit: 8 mmol/L — ABNORMAL HIGH (ref 0.0–2.0)
Bicarbonate: 18.1 mmol/L — ABNORMAL LOW (ref 20.0–28.0)
Calcium, Ion: 1.08 mmol/L — ABNORMAL LOW (ref 1.15–1.40)
HCT: 40 % (ref 36.0–46.0)
Hemoglobin: 13.6 g/dL (ref 12.0–15.0)
O2 Saturation: 100 %
Patient temperature: 36.6
Potassium: 5.1 mmol/L (ref 3.5–5.1)
Sodium: 138 mmol/L (ref 135–145)
TCO2: 19 mmol/L — ABNORMAL LOW (ref 22–32)
pCO2 arterial: 36.2 mmHg (ref 32–48)
pH, Arterial: 7.306 — ABNORMAL LOW (ref 7.35–7.45)
pO2, Arterial: 204 mmHg — ABNORMAL HIGH (ref 83–108)

## 2024-07-27 LAB — GLUCOSE, CAPILLARY
Glucose-Capillary: 104 mg/dL — ABNORMAL HIGH (ref 70–99)
Glucose-Capillary: 105 mg/dL — ABNORMAL HIGH (ref 70–99)
Glucose-Capillary: 105 mg/dL — ABNORMAL HIGH (ref 70–99)
Glucose-Capillary: 110 mg/dL — ABNORMAL HIGH (ref 70–99)
Glucose-Capillary: 179 mg/dL — ABNORMAL HIGH (ref 70–99)
Glucose-Capillary: 99 mg/dL (ref 70–99)

## 2024-07-27 LAB — COMPREHENSIVE METABOLIC PANEL WITH GFR
ALT: 16 U/L (ref 0–44)
ALT: 16 U/L (ref 0–44)
AST: 24 U/L (ref 15–41)
AST: 19 U/L (ref 15–41)
Albumin: 3.7 g/dL (ref 3.5–5.0)
Albumin: 3.5 g/dL (ref 3.5–5.0)
Alkaline Phosphatase: 48 U/L (ref 38–126)
Alkaline Phosphatase: 42 U/L (ref 38–126)
Anion gap: 11 (ref 5–15)
Anion gap: 10 (ref 5–15)
BUN: <5 mg/dL — ABNORMAL LOW (ref 6–20)
BUN: <5 mg/dL — ABNORMAL LOW (ref 6–20)
CO2: 19 mmol/L — ABNORMAL LOW (ref 22–32)
CO2: 20 mmol/L — ABNORMAL LOW (ref 22–32)
Calcium: 7.7 mg/dL — ABNORMAL LOW (ref 8.9–10.3)
Calcium: 7.7 mg/dL — ABNORMAL LOW (ref 8.9–10.3)
Chloride: 107 mmol/L (ref 98–111)
Chloride: 107 mmol/L (ref 98–111)
Creatinine, Ser: 0.6 mg/dL (ref 0.44–1.00)
Creatinine, Ser: 0.65 mg/dL (ref 0.44–1.00)
GFR, Estimated: >60 mL/min (ref >60)
GFR, Estimated: >60 mL/min (ref >60)
Glucose, Bld: 183 mg/dL — ABNORMAL HIGH (ref 70–99)
Glucose, Bld: 137 mg/dL — ABNORMAL HIGH (ref 70–99)
Potassium: 5.1 mmol/L (ref 3.5–5.1)
Potassium: 3.7 mmol/L (ref 3.5–5.1)
Sodium: 137 mmol/L (ref 135–145)
Sodium: 137 mmol/L (ref 135–145)
Total Bilirubin: 0.8 mg/dL (ref 0.0–1.2)
Total Bilirubin: 0.5 mg/dL (ref 0.0–1.2)
Total Protein: 6.8 g/dL (ref 6.5–8.1)
Total Protein: 6.6 g/dL (ref 6.5–8.1)

## 2024-07-27 LAB — RAPID URINE DRUG SCREEN, HOSP PERFORMED
Amphetamines: NOT DETECTED
Barbiturates: NOT DETECTED
Benzodiazepines: POSITIVE — AB
Cocaine: NOT DETECTED
Opiates: NOT DETECTED
Tetrahydrocannabinol: NOT DETECTED

## 2024-07-27 LAB — I-STAT CG4 LACTIC ACID, ED: Lactic Acid, Venous: 3.1 mmol/L (ref 0.5–1.9)

## 2024-07-27 LAB — MAGNESIUM
Magnesium: 1.6 mg/dL — ABNORMAL LOW (ref 1.7–2.4)
Magnesium: 2.6 mg/dL — ABNORMAL HIGH (ref 1.7–2.4)

## 2024-07-27 LAB — HEMOGLOBIN A1C
Hgb A1c MFr Bld: 5.2 % (ref 4.8–5.6)
Mean Plasma Glucose: 102.54 mg/dL

## 2024-07-27 LAB — HIV ANTIBODY (ROUTINE TESTING W REFLEX): HIV Screen 4th Generation wRfx: NONREACTIVE

## 2024-07-27 LAB — MRSA NEXT GEN BY PCR, NASAL: MRSA by PCR Next Gen: NOT DETECTED

## 2024-07-27 LAB — PROTIME-INR
INR: 1 (ref 0.8–1.2)
Prothrombin Time: 14.2 s (ref 11.4–15.2)

## 2024-07-27 LAB — ACETAMINOPHEN LEVEL: Acetaminophen (Tylenol), Serum: 357 ug/mL (ref 10–30)

## 2024-07-27 LAB — HCG, QUANTITATIVE, PREGNANCY: hCG, Beta Chain, Quant, S: <1 m[IU]/mL (ref <5)

## 2024-07-27 LAB — AMMONIA: Ammonia: 23 umol/L (ref 9–35)

## 2024-07-27 LAB — ETHANOL: Alcohol, Ethyl (B): <15 mg/dL (ref <15)

## 2024-07-27 LAB — SALICYLATE LEVEL: Salicylate Lvl: <7 mg/dL — ABNORMAL LOW (ref 7.0–30.0)

## 2024-07-27 LAB — PHOSPHORUS: Phosphorus: 2.9 mg/dL (ref 2.5–4.6)

## 2024-07-27 LAB — LACTIC ACID, PLASMA: Lactic Acid, Venous: 1.2 mmol/L (ref 0.5–1.9)

## 2024-07-27 MED ORDER — PROPOFOL 1000 MG/100ML IV EMUL
0.0000 ug/kg/min | INTRAVENOUS | Status: DC
  Administered 2024-07-27: 55 ug/kg/min via INTRAVENOUS
  Filled 2024-07-27: qty 100

## 2024-07-27 MED ORDER — HEPARIN SODIUM (PORCINE) 5000 UNIT/ML IJ SOLN
5000.0000 [IU] | Freq: Three times a day (TID) | INTRAMUSCULAR | Status: DC
  Administered 2024-07-27 – 2024-07-28 (×4): 5000 [IU] via SUBCUTANEOUS
  Filled 2024-07-27 (×3): qty 1

## 2024-07-27 MED ORDER — ORAL CARE MOUTH RINSE
15.0000 mL | OROMUCOSAL | Status: DC
  Administered 2024-07-27 (×10): 15 mL via OROMUCOSAL

## 2024-07-27 MED ORDER — CHLORHEXIDINE GLUCONATE CLOTH 2 % EX PADS
6.0000 | MEDICATED_PAD | Freq: Every day | CUTANEOUS | Status: DC
  Administered 2024-07-27 – 2024-07-29 (×3): 6 via TOPICAL

## 2024-07-27 MED ORDER — POLYETHYLENE GLYCOL 3350 17 G PO PACK
17.0000 g | PACK | Freq: Every day | ORAL | Status: DC
  Administered 2024-07-28 – 2024-07-29 (×2): 17 g via ORAL
  Filled 2024-07-27 (×2): qty 1

## 2024-07-27 MED ORDER — DOCUSATE SODIUM 50 MG/5ML PO LIQD: 100.0000 mg | Freq: Two times a day (BID) | ORAL | Status: DC | PRN

## 2024-07-27 MED ORDER — POTASSIUM CHLORIDE 20 MEQ PO PACK
40.0000 meq | PACK | Freq: Once | ORAL | Status: AC
  Administered 2024-07-27: 40 meq
  Filled 2024-07-27: qty 2

## 2024-07-27 MED ORDER — DOCUSATE SODIUM 50 MG/5ML PO LIQD
100.0000 mg | Freq: Two times a day (BID) | ORAL | Status: DC
  Administered 2024-07-27 – 2024-07-29 (×4): 100 mg via ORAL
  Filled 2024-07-27 (×4): qty 10

## 2024-07-27 MED ORDER — POLYETHYLENE GLYCOL 3350 17 G PO PACK
17.0000 g | PACK | Freq: Every day | ORAL | Status: DC
  Administered 2024-07-27: 17 g
  Filled 2024-07-27: qty 1

## 2024-07-27 MED ORDER — POTASSIUM CHLORIDE 10 MEQ/100ML IV SOLN
10.0000 meq | INTRAVENOUS | Status: AC
  Administered 2024-07-27 (×5): 10 meq via INTRAVENOUS
  Filled 2024-07-27 (×4): qty 100

## 2024-07-27 MED ORDER — FAMOTIDINE 20 MG PO TABS
20.0000 mg | ORAL_TABLET | Freq: Two times a day (BID) | ORAL | Status: DC
  Filled 2024-07-27: qty 1

## 2024-07-27 MED ORDER — ONDANSETRON HCL 4 MG/2ML IJ SOLN
4.0000 mg | Freq: Four times a day (QID) | INTRAMUSCULAR | Status: DC | PRN
  Administered 2024-07-27: 4 mg via INTRAVENOUS
  Filled 2024-07-27: qty 2

## 2024-07-27 MED ORDER — SODIUM CHLORIDE 0.9% FLUSH: 10.0000 mL | INTRAVENOUS | Status: DC | PRN

## 2024-07-27 MED ORDER — POLYETHYLENE GLYCOL 3350 17 G PO PACK: 17.0000 g | PACK | Freq: Every day | ORAL | Status: DC | PRN

## 2024-07-27 MED ORDER — FENTANYL CITRATE (PF) 50 MCG/ML IJ SOSY
50.0000 ug | PREFILLED_SYRINGE | INTRAMUSCULAR | Status: DC | PRN
  Administered 2024-07-27: 100 ug via INTRAVENOUS
  Administered 2024-07-27: 50 ug via INTRAVENOUS
  Filled 2024-07-27: qty 2
  Filled 2024-07-27: qty 1

## 2024-07-27 MED ORDER — SODIUM CHLORIDE 0.9% FLUSH
10.0000 mL | Freq: Two times a day (BID) | INTRAVENOUS | Status: DC
  Administered 2024-07-27 – 2024-07-29 (×5): 10 mL

## 2024-07-27 MED ORDER — ORAL CARE MOUTH RINSE: 15.0000 mL | OROMUCOSAL | Status: DC | PRN

## 2024-07-27 MED ORDER — DOCUSATE SODIUM 100 MG PO CAPS: 100.0000 mg | ORAL_CAPSULE | Freq: Two times a day (BID) | ORAL | Status: DC | PRN

## 2024-07-27 MED ORDER — FENTANYL CITRATE (PF) 50 MCG/ML IJ SOSY: 50.0000 ug | PREFILLED_SYRINGE | INTRAMUSCULAR | Status: DC | PRN

## 2024-07-27 MED ORDER — DOCUSATE SODIUM 50 MG/5ML PO LIQD
100.0000 mg | Freq: Two times a day (BID) | ORAL | Status: DC
  Filled 2024-07-27: qty 10

## 2024-07-27 MED ORDER — MAGNESIUM SULFATE 4 GM/100ML IV SOLN
4.0000 g | Freq: Once | INTRAVENOUS | Status: AC
  Administered 2024-07-27: 4 g via INTRAVENOUS
  Filled 2024-07-27: qty 100

## 2024-07-27 MED ORDER — FENTANYL 2500MCG IN NS 250ML (10MCG/ML) PREMIX INFUSION
0.0000 ug/h | INTRAVENOUS | Status: DC
  Administered 2024-07-27: 50 ug/h via INTRAVENOUS
  Filled 2024-07-27: qty 250

## 2024-07-27 NOTE — Plan of Care (Signed)
  Problem: Elimination: Goal: Will not experience complications related to urinary retention Outcome: Progressing   Problem: Pain Managment: Goal: General experience of comfort will improve and/or be controlled Outcome: Progressing   Problem: Safety: Goal: Ability to remain free from injury will improve Outcome: Progressing

## 2024-07-27 NOTE — Progress Notes (Signed)
 RT transported patient on the ventilator from ED25 to 2H20. Patient's vitals remained stable throughout transport. RN and NT also present. Report was given to RT on the Unit.

## 2024-07-27 NOTE — Progress Notes (Signed)
 Peripherally Inserted Central Catheter Placement  The IV Nurse has discussed with the patient and/or persons authorized to consent for the patient, the purpose of this procedure and the potential benefits and risks involved with this procedure.  The benefits include less needle sticks, lab draws from the catheter, and the patient may be discharged home with the catheter. Risks include, but not limited to, infection, bleeding, blood clot (thrombus formation), and puncture of an artery; nerve damage and irregular heartbeat and possibility to perform a PICC exchange if needed/ordered by physician.  Alternatives to this procedure were also discussed.  Bard Power PICC patient education guide, fact sheet on infection prevention and patient information card has been provided to patient /or left at bedside.   Consent obtained with mother at bedside  PICC Placement Documentation  PICC Double Lumen 07/27/24 Right Brachial 35 cm 2 cm (Active)  Indication for Insertion or Continuance of Line Limited venous access - need for IV therapy >5 days (PICC only) 07/27/24 0900  Exposed Catheter (cm) 2 cm 07/27/24 0900  Site Assessment Clean, Dry, Intact 07/27/24 0900  Lumen #1 Status Flushed;Saline locked;Blood return noted 07/27/24 0900  Lumen #2 Status Flushed;Saline locked;Blood return noted 07/27/24 0900  Dressing Type Transparent;Securing device 07/27/24 0900  Dressing Status Antimicrobial disc/dressing in place;Clean, Dry, Intact 07/27/24 0900  Line Care Connections checked and tightened 07/27/24 0900  Line Adjustment (NICU/IV Team Only) No 07/27/24 0900  Dressing Intervention New dressing;Adhesive placed at insertion site (IV team only) 07/27/24 0900  Dressing Change Due 08/03/24 07/27/24 0900       Ethyl Priestly Renee 07/27/2024, 9:55 AM

## 2024-07-27 NOTE — Procedures (Signed)
 Patient Name: Suzanne Garcia  MRN: [id number]  Epilepsy Attending: Arlin MALVA Krebs  Referring Physician/Provider: Rosan Deward ORN, NP  Date: 07/27/2024 Duration: 22.30 mins  Patient history: 23yo F with ams. EEG to evaluate for seizure  Level of alertness: Awake  AEDs during EEG study: None  Technical aspects: This EEG study was done with scalp electrodes positioned according to the 10-20 International system of electrode placement. Electrical activity was reviewed with band pass filter of 1-70Hz , sensitivity of 7 uV/mm, display speed of 61mm/sec with a 60Hz  notched filter applied as appropriate. EEG data were recorded continuously and digitally stored.  Video monitoring was available and reviewed as appropriate.  Description: EEG showed continuous generalized 6-9Hz  theta-alpha activity admixed with 12-13hz  beta activity. Hyperventilation and photic stimulation were not performed.     ABNORMALITY - Continuous slow, generalized  IMPRESSION: This study is suggestive of generalized cerebral dysfunction (encephalopathy). No seizures or epileptiform discharges were seen throughout the recording.  Suzanne Garcia

## 2024-07-27 NOTE — Progress Notes (Signed)
 Called EEG for updates, was told that they are short staffed and only have one tech today and will be with this pt ASAP.  NP notified.

## 2024-07-27 NOTE — TOC CM/SW Note (Signed)
 Transition of Care (TOC) CM/SW Note    Patient IVC'd.Envelope number [id number]. Case #[id number].

## 2024-07-27 NOTE — TOC CM/SW Note (Addendum)
 Transition of Care [facility]) - Inpatient Brief Assessment   Patient Details  Name: Suzanne Garcia MRN: [id number] Date of Birth: [birth year]  Transition of Care [facility]) CM/SW Contact:    Sudie Erminio Deems, RN Phone Number: 07/27/2024, 10:56 AM   Clinical Narrative: Patient presented for altered mental status-currently intubated and sedated. Inpatient Case Manager will continue to follow for disposition needs.   1254 Patient extubated and Per MD Rollene, IV C paperwork is on the shadow chart. CSW to follow for additional needs.  Transition of Care Asessment: Insurance and Status:  (No insurance on file) Patient has primary care physician: No Readmission risk has been reviewed: Yes Transition of care needs: transition of care needs identified, TOC will continue to follow

## 2024-07-27 NOTE — Progress Notes (Addendum)
 eLink Physician-Brief Progress Note Patient Name: Suzanne Garcia DOB: 2001-04-29 MRN: [id number]   Date of Service  07/27/2024  HPI/Events of Note  patient is A/Ox4 now and asking for diet orders to be able to eat/drink. Also meds are listed as per tube and patient does not have NG/OGT  eICU Interventions  Advance to regular diet  Discontinue GI prophylaxis  Switch meds to p.o.   0331 -add Chloraseptic throat spray  Intervention Category Minor Interventions: Routine modifications to care plan (e.g. PRN medications for pain, fever)  Russel Morain 07/27/2024, 9:40 PM

## 2024-07-27 NOTE — Plan of Care (Signed)

## 2024-07-27 NOTE — Progress Notes (Signed)
 EEG complete - results pending

## 2024-07-27 NOTE — Progress Notes (Addendum)
 eLink Physician-Brief Progress Note Patient Name: KANSAS SPAINHOWER DOB: 24-Jun-2001 MRN: [id number]   Date of Service  07/27/2024  HPI/Events of Note  23 y/o female who has a h/o Depression and Endometriosis who was BIB EMS after she had ingested 50 tablets of Tylenol  with Diphenhydramine  and 15 tablets of 500 mg extra strength Tylenol  in an apparent suicide attempt.   Patient is tachypneic, tachycardic, and has otherwise normal vitals saturating 99% on 40% FiO2, mechanically ventilated.  Results are consistent with adequate ventilation and oxygenation, metabolic acidosis, mild electrolyte disturbances, normocytic anemia, acetaminophen  toxicity, and benzodiazepine and UDS.  Imaging reviewed.  eICU Interventions  Maintain mechanical ventilation, daily spontaneous awakening/breathing trials  Electrolyte supplementation already in place  Monitor for coagulopathy, continue N-acetylcysteine, monitor for hypoglycemia.  DVT prophylaxis with heparin GI prophylaxis with famotidine    0319 - Okay to stick patient's feet for labs; extremely limited venous access  0547 -initiate fentanyl drip, difficult to obtain RASS/CPOT scores with as needed pushes  Intervention Category Evaluation Type: New Patient Evaluation  Roniyah Llorens 07/27/2024, 2:42 AM

## 2024-07-27 NOTE — Progress Notes (Addendum)
 NAME:  Suzanne Garcia, MRN:  [id number], DOB:  11-09-2000, LOS: 0 ADMISSION DATE:  07/26/2024, CONSULTATION DATE: 10/23 REFERRING MD: Dr. Mannie, CHIEF COMPLAINT: Altered mental status  History of Present Illness:  23 year old female with past medical history as below.  Mother reports she has been under increased stress recently due to work and school.  On 10/23 there was a stressful event regarding her boyfriend, but she was acting pretty normally.  The evening hours she was seen to be acting strange and was shaking violently raising concern for seizure of which she has no history.  EMS was called.  Upon their arrival the patient's mother realized that a bottle of Tylenol  and bottle of Tylenol  PM were both empty.  Her mom estimates that she could have taken as many as 50 Tylenol  PM tablets and 15 Tylenol  Extra Strength tablets.  Upon EMS arrival the patient was lethargic and minimally responsive.  She had a witnessed seizure in the ambulance and received 5 mg of IM Versed.  Upon arrival to [facility] emergency department she was intubated for airway protection.  N-acetylcysteine was initiated.  Poison control was called and made recommendations for labs.  PCCM was asked to admit  Pertinent  Medical History   has a past medical history of Anxiety, Bipolar disorder (HCC), and Depression.   Significant Hospital Events: Including procedures, antibiotic start and stop dates in addition to other pertinent events   10/23: Admitted following intentional Tylenol  overdose of an estimated 30 g. 10/24: Intubated, sedated, pending EEG  Interim History / Subjective:  Admitted overnight   Intubated, sedated- prop/fentanyl   No reported seizure activity while in ICU   Mother at beside   Objective    Blood pressure 120/84, pulse (!) 108, temperature 97.8 F (36.6 C), temperature source Axillary, resp. rate 20, height 4' 10 (1.473 m), weight 74 kg, SpO2 99%.    Vent Mode: PRVC FiO2 (%):  [40  %-100 %] 40 % Set Rate:  [20 bmp] 20 bmp Vt Set:  [330 mL-480 mL] 480 mL PEEP:  [5 cmH20] 5 cmH20 Plateau Pressure:  [6 cmH20-13 cmH20] 6 cmH20   Intake/Output Summary (Last 24 hours) at 07/27/2024 0709 Last data filed at 07/27/2024 0600 Gross per 24 hour  Intake 599.07 ml  Output 5000 ml  Net -4400.93 ml   Filed Weights   07/26/24 2226 07/27/24 0210  Weight: 57.6 kg 74 kg    Examination: General: acute on chronic young adult female, lying in icu bed on vent in NAD HEENT: Normocephalic, PERRLA intact, ETT, OG, Pink MM CV: s1,s2, RRR, no MRG, No JVD  pulm: clear, diminished, no distress on vent  Abs: bs active, soft  Extremities: no edema, no deformity, sedated- moves to painful stimuli  Skin: no rash  Neuro: Rass -2 to -3, responds to painful stimuli, cough gag reflex present  GU: foley intact   Resolved problem list   Assessment and Plan   Intentional overdose of acetaminophen  and diphenhydramine  Received Activated Charcoal  Lactic Acidosis- 3.1, pending trend  10/23 Acetaminophen  level 357, salicylate < 7  Etoh < 15  Ammonia 23  PT 14.2, INR 1 Home medications list mirtazapine , hydroxyzine , and trazodone . Mother was not aware of any of these an did not find those pill bottles with the tylenol .  P: Continue to check LFTs, PT/INR Poison control following, appreciate assistance  Repeat CMET to check K- 5.1 (but K was infusing in extremity), continue to Keep Mag> 2, K >4  Patient making adequate urine, continue to trend  Obtain Repeat EKG Repeat Lactic Acid  PICC line ordered   Acute encephalopathy secondary to toxic ingestion P: EEG pending, notify EEG tech to place leads Continue supportive care  Continue frequent neuro checks Continue airway protection for now  Initiate delirium precautions Once extubated, will need SI sitter, and psych evaluation   Respiratory insufficiency secondary to above - 8cc/kg was only 330 mL and she was hypoventilating despite  increased rate. Peak pressures were low.  Vt increased above typical threshold.  Last ABG at 0459 10/24- 7.306, pCo2 36.2, PO2 204, bicarb 18.1  P: Continue prop and fentanyl gtt for Rass 0 to -1 Continue ventilator support and lung protective strategies  Continue LTVV  Wean PEEP and Fio2 requirements to sat goal of >92%  HOB > 30 degrees Plat < 30  Aim for Driving pressures < 15  Intermittent Chest X-ray and ABGS VAP and PAD protocols in place  Wean sedation as tolerated, SBT and WUA daily    Anxiety Bipolar Disorder  Depression  P: Will need psych consult once extubated  Continue supportive care for now  Hold home medications at this time    Labs   CBC: Recent Labs  Lab 07/26/24 2310 07/26/24 2321 07/26/24 2330 07/27/24 0017 07/27/24 0033 07/27/24 0459  WBC 7.5  --   --  8.4  --   --   NEUTROABS 5.1  --   --   --   --   --   HGB 10.1* 11.2* 11.2* 11.8* 11.9* 13.6  HCT 31.3* 33.0* 33.0* 36.3 35.0* 40.0  MCV 89.4  --   --  88.5  --   --   PLT 305  --   --  351  --   --     Basic Metabolic Panel: Recent Labs  Lab 07/26/24 2310 07/26/24 2321 07/26/24 2330 07/27/24 0017 07/27/24 0033 07/27/24 0327 07/27/24 0459  NA 138 141 139 137 140 137 138  K 3.2* 3.3* 3.4* 3.8 3.7 5.1 5.1  CL 107 106  --  105  --  107  --   CO2 21*  --   --  18*  --  19*  --   GLUCOSE 126* 129*  --  226*  --  183*  --   BUN 8 8  --  8  --  <5*  --   CREATININE 0.52 1.00  --  0.52  --  0.60  --   CALCIUM 7.0*  --   --  7.4*  --  7.7*  --   MG 1.5*  --   --  1.6*  --   --   --   PHOS  --   --   --  2.9  --   --   --    GFR: Estimated Creatinine Clearance: 92.6 mL/min (by C-G formula based on SCr of 0.6 mg/dL). Recent Labs  Lab 07/26/24 2310 07/27/24 0017 07/27/24 0020  WBC 7.5 8.4  --   LATICACIDVEN  --   --  3.1*    Liver Function Tests: Recent Labs  Lab 07/26/24 2310 07/27/24 0327  AST 19 24  ALT 12 16  ALKPHOS 39 48  BILITOT 0.3 0.8  PROT 5.6* 6.8  ALBUMIN 3.2* 3.7    No results for input(s): LIPASE, AMYLASE in the last 168 hours. Recent Labs  Lab 07/27/24 0327  AMMONIA 23    ABG    Component Value Date/Time  PHART 7.306 (L) 07/27/2024 0459   PCO2ART 36.2 07/27/2024 0459   PO2ART 204 (H) 07/27/2024 0459   HCO3 18.1 (L) 07/27/2024 0459   TCO2 19 (L) 07/27/2024 0459   ACIDBASEDEF 8.0 (H) 07/27/2024 0459   O2SAT 100 07/27/2024 0459     Coagulation Profile: Recent Labs  Lab 07/27/24 0327  INR 1.0    Cardiac Enzymes: No results for input(s): CKTOTAL, CKMB, CKMBINDEX, TROPONINI in the last 168 hours.  HbA1C: Hgb A1c MFr Bld  Date/Time Value Ref Range Status  07/27/2024 12:17 AM 5.2 4.8 - 5.6 % Final    Comment:    (NOTE) Diagnosis of Diabetes The following HbA1c ranges recommended by the American Diabetes Association (ADA) may be used as an aid in the diagnosis of diabetes mellitus.  Hemoglobin             Suggested A1C NGSP%              Diagnosis  <5.7                   Non Diabetic  5.7-6.4                Pre-Diabetic  >6.4                   Diabetic  <7.0                   Glycemic control for                       adults with diabetes.    12/02/2020 06:24 AM 5.0 4.8 - 5.6 % Final    Comment:    (NOTE) Pre diabetes:          5.7%-6.4%  Diabetes:              >6.4%  Glycemic control for   <7.0% adults with diabetes     CBG: Recent Labs  Lab 07/26/24 2310 07/27/24 0247 07/27/24 0604  GLUCAP 82 179* 105*    Review of Systems:   Patient is encephalopathic and/or intubated; therefore, history has been obtained from chart review.    Past Medical History:  She,  has a past medical history of Anxiety, Bipolar disorder (HCC), and Depression.   Surgical History:   Past Surgical History:  Procedure Laterality Date   NO PAST SURGERIES       Social History:   reports that she has never smoked. She has never used smokeless tobacco. She reports that she does not currently use alcohol. She  reports that she does not currently use drugs.   Family History:  Her family history includes ADD / ADHD in her sister; Asthma in her mother; Bipolar disorder in her maternal grandfather, maternal grandmother, and sister; Depression in her mother; Diabetes in her maternal grandmother and mother; Heart disease in her maternal grandmother; Mental illness in her father; Miscarriages / Stillbirths in her mother; Multiple sclerosis in her maternal grandfather; Obesity in her mother.   Allergies Allergies  Allergen Reactions   Ibuprofen      Patient states that it causes stomach thinning      Home Medications  Prior to Admission medications   Medication Sig Start Date End Date Taking? Authorizing Provider  hydrOXYzine  (ATARAX ) 25 MG tablet Take 1 tablet (25 mg total) by mouth 3 (three) times daily as needed for anxiety. 01/11/22   Penn, Reymundo, NP  medroxyPROGESTERone  (DEPO-PROVERA ) 150 MG/ML injection Inject 1  mL (150 mg total) into the muscle every 3 (three) months. 05/20/22   Zina Jerilynn LABOR, MD  metroNIDAZOLE  (FLAGYL ) 500 MG tablet Take 1 tablet (500 mg total) by mouth 2 (two) times daily. 05/24/22   Constant, Peggy, MD  mirtazapine  (REMERON  SOL-TAB) 15 MG disintegrating tablet Take 0.5 tablets (7.5 mg total) by mouth at bedtime. For depression.sleep 11/30/21   Sammy Molder, NP  traZODone  (DESYREL ) 50 MG tablet Take 1 tablet (50 mg total) by mouth at bedtime as needed for sleep. 01/11/22   Penn, Molder, NP  valACYclovir  (VALTREX ) 1000 MG tablet Take 1 tablet (1,000 mg total) by mouth 2 (two) times daily. Take for 5 days with each outbreak. 01/07/22   Rudy Carlin LABOR, MD     Critical care time: 50 mins    [facility]   [facility] 07/27/2024, 7:54 AM  Please see [email or web address] for pager details.  From 7A-7P if no response, please call [phone]. After hours, please call ELink [phone].

## 2024-07-27 NOTE — TOC Initial Note (Signed)
 Transition of Care [facility]) - Initial/Assessment Note    Patient Details  Name: Suzanne Garcia MRN: [id number] Date of Birth: Aug 13, 2001  Transition of Care [facility]) CM/SW Contact:    Lauraine FORBES Saa, LCSWA Phone Number: 07/27/2024, 4:25 PM  Clinical Narrative:                 4:25 PM Per chart review, patient is currently IVC'ed and Psychiatry recommended [facility] discharge to [facility]. Patient resides at home. Patient does not have a PCP or insurance. CSW consulted RNCM and financial counseling for further assistance. Patient does not have SNF/HH/DME history. Patient's preferred pharmacy's are [facility], [facility] [address], and [phone] [place]. CSW will continue to follow.   Expected Discharge Plan: Psychiatric Hospital Barriers to Discharge: Continued Medical Work up   Patient Goals and CMS Choice            Expected Discharge Plan and Services In-house Referral: Clinical Social Work     Living arrangements for the past 2 months: Apartment                                      Prior Living Arrangements/Services Living arrangements for the past 2 months: Apartment   Patient language and need for interpreter reviewed:: Yes        Need for Family Participation in Patient Care: No (Comment)     Criminal Activity/Legal Involvement Pertinent to Current Situation/Hospitalization: No - Comment as needed  Activities of Daily Living      Permission Sought/Granted Permission sought to share information with : Family Supports Permission granted to share information with : No (Contact information on chart)  Share Information with NAME: Suzanne Garcia     Permission granted to share info w Relationship: Mother  Permission granted to share info w Contact Information: [phone]  Emotional Assessment       Orientation: : Oriented to Self, Oriented to Place, Oriented to  Time, Oriented to  Situation Alcohol / Substance Use: Not Applicable Psych Involvement: Yes (comment)  Admission diagnosis:  Intentional acetaminophen  overdose (HCC) [T39.1X2A] Intentional acetaminophen  overdose, initial encounter (HCC) [T39.1X2A] Intentional overdose, initial encounter (HCC) [T50.902A] Patient Active Problem List   Diagnosis Date Noted   Intentional acetaminophen  overdose (HCC) 07/27/2024   ASCUS with positive high risk HPV cervical 09/29/2021   Suicidal ideation 12/02/2020   MDD (major depressive disorder), recurrent severe, 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 [place] [address] Phone: [phone] Fax: [phone]  [facility] [address] [place] [address] Phone: [phone] Fax: [phone]  [phone] [place] [place] - [address] AT [facility] OF [address] [place] [address] Phone: [phone] Fax: [phone]     Social Drivers of Health (SDOH) Social History: SDOH Screenings   Food Insecurity: No Food Insecurity (04/23/2020)  Transportation Needs: No Transportation Needs (04/23/2020)  Alcohol Screen: Low Risk  (12/01/2020)  Depression (PHQ2-9): High Risk (11/30/2021)  Tobacco Use: Low Risk  (07/27/2024)   SDOH Interventions:     Readmission Risk Interventions     No data to display

## 2024-07-27 NOTE — Consult Note (Signed)
 [facility] Psychiatric Consult Initial  Patient Name: .Suzanne Garcia  MRN: [id number]  DOB: [birth year]  Consult Order details:  Orders (From admission, onward)     Start     Ordered   07/27/24 1032  IP CONSULT TO PSYCHIATRY       Ordering Provider: Claudene Fonda BROCKS, NP  Provider:  (Not yet assigned)  Question Answer Comment  Location [facility]   Reason for Consult? Intentional Overdose      07/27/24 1031             Mode of Visit: In person    Psychiatry Consult Evaluation  Service Date: July 27, 2024 LOS:  LOS: 0 days  Chief Complaint Suicide Attempt via overdose  Primary Psychiatric Diagnoses  Depression 2.  Anxiety  Assessment  Suzanne Garcia is a 23 y.o. female admitted: Medicallyfor 07/26/2024 10:23 PM for Acetaminophen  overdose. She carries the psychiatric diagnoses of Depression and Anxiety.   On initial examination, patient was seen laying in bed sedated and intubated. Interview was conducted through the patient's mother at bedside. She reports that prior to coming to the hospital the patient had come home from work and was not acting like her normal self. She went outside into her car and then when she came inside the house she laid on her mother's bed with one of her siblings. The patient's sibling became concerned stating that the patient looked possessed. The patient's mother was alerted and she called EMS. Law enforcement checked the patient's vehicle and found two empty pill bottles of Tylenol  PM. She believes that the patient took the overdose because a guy that she was seeing romantically at work came in with another woman and this trigger the patient. Please see plan below for detailed recommendations.   Diagnoses:  Active Hospital problems: Principal Problem:   Intentional acetaminophen  overdose (HCC)    Plan   ## Psychiatric Medication Recommendations:  -No medication recommendations   ## Medical Decision Making  Capacity: Not specifically addressed in this encounter  ## Further Work-up:  -- Per Primary Team -- Pertinent labwork reviewed earlier this admission includes:  Recent Results (from the past 2160 hours)  Acetaminophen  level     Status: Abnormal   Collection Time: 07/26/24 11:10 PM  Result Value Ref Range   Acetaminophen  (Tylenol ), Serum 357 (HH) 10 - 30 ug/mL    Comment: RESULT CONFIRMED BY MANUAL DILUTION 1st attempt 07/27/24 0021 maules CRITICAL RESULT CALLED TO, READ BACK BY AND VERIFIED WITH RN, MICHEAL BOWEN 07/27/24 MAULES (NOTE) Therapeutic concentrations vary significantly. A range of 10-30 ug/mL  may be an effective concentration for many patients. However, some  are best treated at concentrations outside of this range. Acetaminophen  concentrations >150 ug/mL at 4 hours after ingestion  and >50 ug/mL at 12 hours after ingestion are often associated with  toxic reactions.  Performed at [facility], [address] [place], [place] [address]   Comprehensive metabolic panel     Status: Abnormal   Collection Time: 07/26/24 11:10 PM  Result Value Ref Range   Sodium 138 135 - 145 mmol/L   Potassium 3.2 (L) 3.5 - 5.1 mmol/L   Chloride 107 98 - 111 mmol/L   CO2 21 (L) 22 - 32 mmol/L   Glucose, Bld 126 (H) 70 - 99 mg/dL    Comment: Glucose reference range applies only to samples taken after fasting for at least 8 hours.   BUN 8 6 - 20 mg/dL  Creatinine, Ser 0.52 0.44 - 1.00 mg/dL   Calcium 7.0 (L) 8.9 - 10.3 mg/dL   Total Protein 5.6 (L) 6.5 - 8.1 g/dL   Albumin 3.2 (L) 3.5 - 5.0 g/dL   AST 19 15 - 41 U/L   ALT 12 0 - 44 U/L   Alkaline Phosphatase 39 38 - 126 U/L   Total Bilirubin 0.3 0.0 - 1.2 mg/dL   GFR, Estimated >39 >39 mL/min    Comment: (NOTE) Calculated using the CKD-EPI Creatinine Equation (2021)    Anion gap 10 5 - 15    Comment: Performed at [facility], [address] [place], [place] [address]  CBC with Differential     Status: Abnormal    Collection Time: 07/26/24 11:10 PM  Result Value Ref Range   WBC 7.5 4.0 - 10.5 K/uL   RBC 3.50 (L) 3.87 - 5.11 MIL/uL   Hemoglobin 10.1 (L) 12.0 - 15.0 g/dL   HCT 68.6 (L) 63.9 - 53.9 %   MCV 89.4 80.0 - 100.0 fL   MCH 28.9 26.0 - 34.0 pg   MCHC 32.3 30.0 - 36.0 g/dL   RDW 87.1 88.4 - 84.4 %   Platelets 305 150 - 400 K/uL   nRBC 0.0 0.0 - 0.2 %   Neutrophils Relative % 67 %   Neutro Abs 5.1 1.7 - 7.7 K/uL   Lymphocytes Relative 24 %   Lymphs Abs 1.8 0.7 - 4.0 K/uL   Monocytes Relative 6 %   Monocytes Absolute 0.5 0.1 - 1.0 K/uL   Eosinophils Relative 1 %   Eosinophils Absolute 0.0 0.0 - 0.5 K/uL   Basophils Relative 1 %   Basophils Absolute 0.0 0.0 - 0.1 K/uL   Immature Granulocytes 1 %   Abs Immature Granulocytes 0.04 0.00 - 0.07 K/uL    Comment: Performed at [facility], [address], [place], [place] [address]  Salicylate level     Status: Abnormal   Collection Time: 07/26/24 11:10 PM  Result Value Ref Range   Salicylate Lvl <7.0 (L) 7.0 - 30.0 mg/dL    Comment: Performed at [facility], [address], [place], [place] [address]  Magnesium      Status: Abnormal   Collection Time: 07/26/24 11:10 PM  Result Value Ref Range   Magnesium  1.5 (L) 1.7 - 2.4 mg/dL    Comment: Performed at [facility], [address] [place], [place] [address]  Ethanol     Status: None   Collection Time: 07/26/24 11:10 PM  Result Value Ref Range   Alcohol, Ethyl (B) <15 <15 mg/dL    Comment: (NOTE) For medical purposes only. Performed at [facility], [address], [place], [place] [address]   hCG, quantitative, pregnancy     Status: None   Collection Time: 07/26/24 11:10 PM  Result Value Ref Range   hCG, Beta Chain, Quant, S <1 <5 mIU/mL    Comment:          GEST. AGE      CONC.  (mIU/mL)   <=1 WEEK        5 - 50     2 WEEKS       50 - 500     3 WEEKS       100 - 10,000     4 WEEKS     1,000 - 30,000     5 WEEKS     3,500 - 115,000  6-8 WEEKS      12,000 - 270,000    12 WEEKS     15,000 - 220,000        FEMALE AND NON-PREGNANT FEMALE:     LESS THAN 5 mIU/mL Performed at [facility], [address], [place], [place] [address]   POC CBG, ED     Status: None   Collection Time: 07/26/24 11:10 PM  Result Value Ref Range   Glucose-Capillary 82 70 - 99 mg/dL    Comment: Glucose reference range applies only to samples taken after fasting for at least 8 hours.  I-stat chem 8, ED (not at [facility], [facility] or [facility])     Status: Abnormal   Collection Time: 07/26/24 11:21 PM  Result Value Ref Range   Sodium 141 135 - 145 mmol/L   Potassium 3.3 (L) 3.5 - 5.1 mmol/L   Chloride 106 98 - 111 mmol/L   BUN 8 6 - 20 mg/dL   Creatinine, Ser 8.99 0.44 - 1.00 mg/dL   Glucose, Bld 870 (H) 70 - 99 mg/dL    Comment: Glucose reference range applies only to samples taken after fasting for at least 8 hours.   Calcium, Ion 0.97 (L) 1.15 - 1.40 mmol/L   TCO2 22 22 - 32 mmol/L   Hemoglobin 11.2 (L) 12.0 - 15.0 g/dL   HCT 66.9 (L) 63.9 - 53.9 %  I-Stat arterial blood gas, ED ([facility], [facility], [facility])     Status: Abnormal   Collection Time: 07/26/24 11:30 PM  Result Value Ref Range   pH, Arterial 7.230 (L) 7.35 - 7.45   pCO2 arterial 53.0 (H) 32 - 48 mmHg   pO2, Arterial 469 (H) 83 - 108 mmHg   Bicarbonate 22.3 20.0 - 28.0 mmol/L   TCO2 24 22 - 32 mmol/L   O2 Saturation 100 %   Acid-base deficit 6.0 (H) 0.0 - 2.0 mmol/L   Sodium 139 135 - 145 mmol/L   Potassium 3.4 (L) 3.5 - 5.1 mmol/L   Calcium, Ion 1.07 (L) 1.15 - 1.40 mmol/L   HCT 33.0 (L) 36.0 - 46.0 %   Hemoglobin 11.2 (L) 12.0 - 15.0 g/dL   Patient temperature 02.0 F    Sample type ARTERIAL   Rapid urine drug screen (hospital performed)     Status: Abnormal   Collection Time: 07/26/24 11:30 PM  Result Value Ref Range   Opiates NONE DETECTED NONE DETECTED   Cocaine NONE DETECTED NONE DETECTED   Benzodiazepines POSITIVE (A) NONE DETECTED   Amphetamines NONE DETECTED NONE DETECTED   Tetrahydrocannabinol  NONE DETECTED NONE DETECTED   Barbiturates NONE DETECTED NONE DETECTED    Comment: (NOTE) DRUG SCREEN FOR MEDICAL PURPOSES ONLY.  IF CONFIRMATION IS NEEDED FOR ANY PURPOSE, NOTIFY LAB WITHIN 5 DAYS.  LOWEST DETECTABLE LIMITS FOR URINE DRUG SCREEN Drug Class                     Cutoff (ng/mL) Amphetamine and metabolites    1000 Barbiturate and metabolites    200 Benzodiazepine                 200 Opiates and metabolites        300 Cocaine and metabolites        300 THC                            50 Performed at [facility],  [address], [place], [place] [address]   CBC     Status: Abnormal   Collection Time: 07/27/24 12:17 AM  Result Value Ref Range   WBC 8.4 4.0 - 10.5 K/uL   RBC 4.10 3.87 - 5.11 MIL/uL   Hemoglobin 11.8 (L) 12.0 - 15.0 g/dL   HCT 63.6 63.9 - 53.9 %   MCV 88.5 80.0 - 100.0 fL   MCH 28.8 26.0 - 34.0 pg   MCHC 32.5 30.0 - 36.0 g/dL   RDW 87.2 88.4 - 84.4 %   Platelets 351 150 - 400 K/uL   nRBC 0.0 0.0 - 0.2 %    Comment: Performed at [facility], [address], [place], [place] [address]  Basic metabolic panel     Status: Abnormal   Collection Time: 07/27/24 12:17 AM  Result Value Ref Range   Sodium 137 135 - 145 mmol/L   Potassium 3.8 3.5 - 5.1 mmol/L   Chloride 105 98 - 111 mmol/L   CO2 18 (L) 22 - 32 mmol/L   Glucose, Bld 226 (H) 70 - 99 mg/dL    Comment: Glucose reference range applies only to samples taken after fasting for at least 8 hours.   BUN 8 6 - 20 mg/dL   Creatinine, Ser 9.47 0.44 - 1.00 mg/dL   Calcium 7.4 (L) 8.9 - 10.3 mg/dL   GFR, Estimated >39 >39 mL/min    Comment: (NOTE) Calculated using the CKD-EPI Creatinine Equation (2021)    Anion gap 14 5 - 15    Comment: Performed at [facility], [address], [place], [place] [address]  Magnesium      Status: Abnormal   Collection Time: 07/27/24 12:17 AM  Result Value Ref Range   Magnesium  1.6 (L) 1.7 - 2.4 mg/dL    Comment: Performed at [facility]  Lab, [address], [place], [place] [address]  Phosphorus     Status: None   Collection Time: 07/27/24 12:17 AM  Result Value Ref Range   Phosphorus 2.9 2.5 - 4.6 mg/dL    Comment: Performed at [facility], [address] [place], [place] [address]  Hemoglobin A1c     Status: None   Collection Time: 07/27/24 12:17 AM  Result Value Ref Range   Hgb A1c MFr Bld 5.2 4.8 - 5.6 %    Comment: (NOTE) Diagnosis of Diabetes The following HbA1c ranges recommended by the American Diabetes Association (ADA) may be used as an aid in the diagnosis of diabetes mellitus.  Hemoglobin             Suggested A1C NGSP%              Diagnosis  <5.7                   Non Diabetic  5.7-6.4                Pre-Diabetic  >6.4                   Diabetic  <7.0                   Glycemic control for                       adults with diabetes.     Mean Plasma Glucose 102.54 mg/dL    Comment: Performed at [facility], [address] [place],  [place] [address]  I-Stat CG4 Lactic Acid     Status: Abnormal   Collection Time: 07/27/24 12:20 AM  Result Value Ref Range   Lactic Acid, Venous 3.1 (HH) 0.5 - 1.9 mmol/L   Comment NOTIFIED PHYSICIAN   I-Stat arterial blood gas, ED     Status: Abnormal   Collection Time: 07/27/24 12:33 AM  Result Value Ref Range   pH, Arterial 7.239 (L) 7.35 - 7.45   pCO2 arterial 40.6 32 - 48 mmHg   pO2, Arterial 162 (H) 83 - 108 mmHg   Bicarbonate 17.4 (L) 20.0 - 28.0 mmol/L   TCO2 19 (L) 22 - 32 mmol/L   O2 Saturation 99 %   Acid-base deficit 10.0 (H) 0.0 - 2.0 mmol/L   Sodium 140 135 - 145 mmol/L   Potassium 3.7 3.5 - 5.1 mmol/L   Calcium, Ion 1.03 (L) 1.15 - 1.40 mmol/L   HCT 35.0 (L) 36.0 - 46.0 %   Hemoglobin 11.9 (L) 12.0 - 15.0 g/dL   Patient temperature 02.0 F    Sample type ARTERIAL   Glucose, capillary     Status: Abnormal   Collection Time: 07/27/24  2:47 AM  Result Value Ref Range   Glucose-Capillary 179 (H) 70 - 99 mg/dL    Comment: Glucose  reference range applies only to samples taken after fasting for at least 8 hours.  MRSA Next Gen by PCR, Nasal     Status: None   Collection Time: 07/27/24  2:51 AM   Specimen: Nasal Mucosa; Nasal Swab  Result Value Ref Range   MRSA by PCR Next Gen NOT DETECTED NOT DETECTED    Comment: (NOTE) The GeneXpert MRSA Assay (FDA approved for NASAL specimens only), is one component of a comprehensive MRSA colonization surveillance program. It is not intended to diagnose MRSA infection nor to guide or monitor treatment for MRSA infections. Test performance is not FDA approved in patients less than 29 years old. Performed at [facility], [address] [place], [place] [address]   Protime-INR     Status: None   Collection Time: 07/27/24  3:27 AM  Result Value Ref Range   Prothrombin Time 14.2 11.4 - 15.2 seconds   INR 1.0 0.8 - 1.2    Comment: (NOTE) INR goal varies based on device and disease states. Performed at [facility], [address], [place], [place] [address]   Comprehensive metabolic panel     Status: Abnormal   Collection Time: 07/27/24  3:27 AM  Result Value Ref Range   Sodium 137 135 - 145 mmol/L   Potassium 5.1 3.5 - 5.1 mmol/L    Comment: DELTA CHECK NOTED   Chloride 107 98 - 111 mmol/L   CO2 19 (L) 22 - 32 mmol/L   Glucose, Bld 183 (H) 70 - 99 mg/dL    Comment: Glucose reference range applies only to samples taken after fasting for at least 8 hours.   BUN <5 (L) 6 - 20 mg/dL   Creatinine, Ser 9.39 0.44 - 1.00 mg/dL   Calcium 7.7 (L) 8.9 - 10.3 mg/dL   Total Protein 6.8 6.5 - 8.1 g/dL   Albumin 3.7 3.5 - 5.0 g/dL   AST 24 15 - 41 U/L   ALT 16 0 - 44 U/L   Alkaline Phosphatase 48 38 - 126 U/L   Total Bilirubin 0.8 0.0 - 1.2 mg/dL   GFR, Estimated >39 >39 mL/min    Comment: (NOTE) Calculated using the CKD-EPI  Creatinine Equation (2021)    Anion gap 11 5 - 15    Comment: Performed at [facility], [address], [place], [place] [address]   Ammonia     Status: None   Collection Time: 07/27/24  3:27 AM  Result Value Ref Range   Ammonia 23 9 - 35 umol/L    Comment: Performed at [facility], [address], [place], [place] [address]  I-STAT 7, (LYTES, BLD GAS, ICA, H+H)     Status: Abnormal   Collection Time: 07/27/24  4:59 AM  Result Value Ref Range   pH, Arterial 7.306 (L) 7.35 - 7.45   pCO2 arterial 36.2 32 - 48 mmHg   pO2, Arterial 204 (H) 83 - 108 mmHg   Bicarbonate 18.1 (L) 20.0 - 28.0 mmol/L   TCO2 19 (L) 22 - 32 mmol/L   O2 Saturation 100 %   Acid-base deficit 8.0 (H) 0.0 - 2.0 mmol/L   Sodium 138 135 - 145 mmol/L   Potassium 5.1 3.5 - 5.1 mmol/L   Calcium, Ion 1.08 (L) 1.15 - 1.40 mmol/L   HCT 40.0 36.0 - 46.0 %   Hemoglobin 13.6 12.0 - 15.0 g/dL   Patient temperature 63.3 C    Collection site RADIAL, ALLEN'S TEST ACCEPTABLE    Drawn by RT    Sample type ARTERIAL   Glucose, capillary     Status: Abnormal   Collection Time: 07/27/24  6:04 AM  Result Value Ref Range   Glucose-Capillary 105 (H) 70 - 99 mg/dL    Comment: Glucose reference range applies only to samples taken after fasting for at least 8 hours.  Glucose, capillary     Status: Abnormal   Collection Time: 07/27/24  8:08 AM  Result Value Ref Range   Glucose-Capillary 110 (H) 70 - 99 mg/dL    Comment: Glucose reference range applies only to samples taken after fasting for at least 8 hours.  Lactic acid, plasma     Status: None   Collection Time: 07/27/24  8:28 AM  Result Value Ref Range   Lactic Acid, Venous 1.2 0.5 - 1.9 mmol/L    Comment: Performed at [facility], [address] [place], [place] [address]      ## Disposition:-- We recommend inpatient psychiatric hospitalization when medically cleared. Patient is under voluntary admission status at this time; please IVC if attempts to leave hospital.  ## Behavioral / Environmental: - No specific recommendations at this time.     ## Safety and Observation Level:  - Based on  my clinical evaluation, I estimate the patient to be at high risk of self harm in the current setting. - At this time, we recommend  1:1 Observation. This decision is based on my review of the chart including patient's history and current presentation, interview of the patient, mental status examination, and consideration of suicide risk including evaluating suicidal ideation, plan, intent, suicidal or self-harm behaviors, risk factors, and protective factors. This judgment is based on our ability to directly address suicide risk, implement suicide prevention strategies, and develop a safety plan while the patient is in the clinical setting. Please contact our team if there is a concern that risk level has changed.  CSSR Risk Category:C-SSRS RISK CATEGORY: High Risk  Suicide Risk Assessment: Patient has following modifiable risk factors for suicide: Recent suicide attempt, which we are addressing by recommending inpatient psychiatric placement. Patient has following non-modifiable or demographic risk factors for suicide: history of  suicide attempt and psychiatric hospitalization Patient has the following protective factors against suicide: Access to outpatient mental health care and Supportive family  Thank you for this consult request. Recommendations have been communicated to the primary team.  We will continue to follow at this time.   Porfirio LITTIE Glatter, DO       History of Present Illness  Patient Report:  On initial examination, patient was seen laying in bed sedated and intubated. Interview was conducted through the patient's mother at bedside. She reports that prior to coming to the hospital the patient had come home from work and was not acting like her normal self. She went outside into her car and then when she came inside the house she laid on her mother's bed with one of her siblings. The patient's sibling became concerned stating that the patient looked possessed. The patient's  mother was alerted and she called EMS. Law enforcement checked the patient's vehicle and found two empty pill bottles of Tylenol  PM. She believes that the patient took the overdose because a guy that she was seeing romantically at work came in with another woman and this trigger the patient  Psych ROS:  Depression: Yes Anxiety:  Yes Mania (lifetime and current): No Psychosis: (lifetime and current): No  Collateral information:  Spoke to mother at bedside see HPI  ROS   Psychiatric and Social History  Psychiatric History:  Information collected from Patient's mother  Prev Dx/Sx: Depression and Anxiety Current Psych Provider: None reported Home Meds (current): None reported Previous Med Trials: Hydroxyzine , Remeron , Trazodone  Therapy: None reported  Prior Psych Hospitalization: Twice  Prior Self Harm: History of cutting Prior Violence: None  Family Psych History: Father unknown psychiatric history Family Hx suicide: Not reported  Social History:  Developmental Hx: Unremarkable Educational Hx: Some college Occupational Hx: Employed at [employer] Hx: Denies Living Situation: Home with mother, brother, and two sisters Spiritual Hx: Did not assess Access to weapons/lethal means: Denies   Substance History Alcohol: Denies  Type of alcohol NA Last Drink NA Number of drinks per day NA History of alcohol withdrawal seizures NA History of DT's NA Tobacco: Denies Illicit drugs: Denies Prescription drug abuse: Denies Rehab hx: NA  Exam Findings  Physical Exam:  Vital Signs:  Temp:  [97.8 F (36.6 C)-98 F (36.7 C)] 97.8 F (36.6 C) (10/24 0335) Pulse Rate:  [83-120] 89 (10/24 1055) Resp:  [12-32] 12 (10/24 1055) BP: (95-134)/(68-97) 125/78 (10/24 0900) SpO2:  [98 %-100 %] 100 % (10/24 1055) FiO2 (%):  [40 %-100 %] 40 % (10/24 1055) Weight:  [57.6 kg-74 kg] 74 kg (10/24 0210) Blood pressure 125/78, pulse 89, temperature 97.8 F (36.6 C), temperature source  Axillary, resp. rate 12, height 4' 10 (1.473 m), weight 74 kg, SpO2 100%. Body mass index is 34.1 kg/m.  Physical Exam  Mental Status Exam: General Appearance: Laying in bed intubated  Orientation:  UTA  Memory:  UTA  Concentration:  UTA  Recall:  UTA  Attention  UTA  Eye Contact:  UTA  Speech:  UTA  Language:  UTA  Volume:  UTA  Mood: UTA  Affect:  UTA  Thought Process:  UTA  Thought Content:  UTA  Suicidal Thoughts:  Recent suicide attempte  Homicidal Thoughts:  UTA  Judgement:  Poor  Insight:  UTA  Psychomotor Activity:  UTA  Akathisia:  UTA  Fund of Knowledge:  UTA      Assets:  UTA  Cognition:  UTA  ADL's:  UTA  AIMS (if indicated):        Other History   These have been pulled in through the EMR, reviewed, and updated if appropriate.  Family History:  The patient's family history includes ADD / ADHD in her sister; Asthma in her mother; Bipolar disorder in her maternal grandfather, maternal grandmother, and sister; Depression in her mother; Diabetes in her maternal grandmother and mother; Heart disease in her maternal grandmother; Mental illness in her father; Miscarriages / Stillbirths in her mother; Multiple sclerosis in her maternal grandfather; Obesity in her mother.  Medical History: Past Medical History:  Diagnosis Date   Anxiety    Bipolar disorder (HCC)    Depression     Surgical History: Past Surgical History:  Procedure Laterality Date   NO PAST SURGERIES       Medications:   Current Facility-Administered Medications:    acetylcysteine (ACETADOTE) 18,000 mg in dextrose 5 % 590 mL (30.5085 mg/mL) infusion, 15 mg/kg/hr, Intravenous, Continuous, Mannie Pac T, DO, Last Rate: 28.3 mL/hr at 07/27/24 0800, 15 mg/kg/hr at 07/27/24 0800   Chlorhexidine Gluconate Cloth 2 % PADS 6 each, 6 each, Topical, Daily, Rosan Deward ORN, NP, 6 each at 07/27/24 1049   docusate (COLACE) 50 MG/5ML liquid 100 mg, 100 mg, Per Tube, BID, Hoffman, Paul W, NP, 100  mg at 07/27/24 1058   docusate (COLACE) 50 MG/5ML liquid 100 mg, 100 mg, Per Tube, BID PRN, Laron Agent, RPH   famotidine  (PEPCID ) tablet 20 mg, 20 mg, Per Tube, BID, Hoffman, Paul W, NP, 20 mg at 07/27/24 1058   fentaNYL (SUBLIMAZE) injection 50 mcg, 50 mcg, Intravenous, Q15 min PRN, Hoffman, Paul W, NP   fentaNYL (SUBLIMAZE) injection 50-200 mcg, 50-200 mcg, Intravenous, Q30 min PRN, Hoffman, Paul W, NP, 100 mcg at 07/27/24 0541   fentaNYL in NS (41mcg/ml) infusion-PREMIX, 0-400 mcg/hr, Intravenous, Continuous, Paliwal, Aditya, MD, Last Rate: 5 mL/hr at 07/27/24 0800, 50 mcg/hr at 07/27/24 0800   heparin injection 5,000 Units, 5,000 Units, Subcutaneous, Q8H, Rosan Deward ORN, NP, 5,000 Units at 07/27/24 0541   Oral care mouth rinse, 15 mL, Mouth Rinse, Q2H, Maree Harder, MD, 15 mL at 07/27/24 1059   Oral care mouth rinse, 15 mL, Mouth Rinse, PRN, Maree Harder, MD   polyethylene glycol (MIRALAX / GLYCOLAX) packet 17 g, 17 g, Per Tube, Daily PRN, Rosan Deward ORN, NP   polyethylene glycol (MIRALAX / GLYCOLAX) packet 17 g, 17 g, Per Tube, Daily, Rosan Deward ORN, NP, 17 g at 07/27/24 1058   propofol (DIPRIVAN) 1000 MG/100ML infusion, 0-80 mcg/kg/min, Intravenous, Continuous, Rosan Deward ORN, NP, Last Rate: 6.91 mL/hr at 07/27/24 0800, 20 mcg/kg/min at 07/27/24 0800   sodium chloride  flush (NS) 0.9 % injection 10-40 mL, 10-40 mL, Intracatheter, Q12H, Maree Harder, MD, 10 mL at 07/27/24 1050   sodium chloride  flush (NS) 0.9 % injection 10-40 mL, 10-40 mL, Intracatheter, PRN, Maree Harder, MD  Allergies: Allergies  Allergen Reactions   Ibuprofen      Patient states that it causes stomach thinning     Porfirio LITTIE Glatter, DO

## 2024-07-27 NOTE — Procedures (Signed)
 Extubation Procedure Note  Patient Details:   Name: Suzanne Garcia DOB: Nov 05, 2000 MRN: [id number]   Airway Documentation:    Vent end date: 07/27/24 Vent end time: 1204   Evaluation  O2 sats: stable throughout Complications: No apparent complications Patient did tolerate procedure well. Bilateral Breath Sounds: Clear, Diminished   Yes  RT extubated patient per NP order with RN and NP at bedside. Positive cuff leak noted. No distress or stridor noted at this time. RT will continue to monitor as needed.   Paulla ONEIDA Gaskins 07/27/2024, 12:16 PM

## 2024-07-28 ENCOUNTER — Encounter (HOSPITAL_COMMUNITY): Payer: Self-pay

## 2024-07-28 DIAGNOSIS — G929 Unspecified toxic encephalopathy: Secondary | ICD-10-CM

## 2024-07-28 DIAGNOSIS — E8729 Other acidosis: Secondary | ICD-10-CM

## 2024-07-28 LAB — COMPREHENSIVE METABOLIC PANEL WITH GFR
ALT: 14 U/L (ref 0–44)
ALT: 13 U/L (ref 0–44)
AST: 16 U/L (ref 15–41)
AST: 14 U/L — ABNORMAL LOW (ref 15–41)
Albumin: 3.1 g/dL — ABNORMAL LOW (ref 3.5–5.0)
Albumin: 3.2 g/dL — ABNORMAL LOW (ref 3.5–5.0)
Alkaline Phosphatase: 40 U/L (ref 38–126)
Alkaline Phosphatase: 38 U/L (ref 38–126)
Anion gap: 10 (ref 5–15)
Anion gap: 10 (ref 5–15)
BUN: <5 mg/dL — ABNORMAL LOW (ref 6–20)
BUN: 5 mg/dL — ABNORMAL LOW (ref 6–20)
CO2: 21 mmol/L — ABNORMAL LOW (ref 22–32)
CO2: 21 mmol/L — ABNORMAL LOW (ref 22–32)
Calcium: 8.1 mg/dL — ABNORMAL LOW (ref 8.9–10.3)
Calcium: 8.1 mg/dL — ABNORMAL LOW (ref 8.9–10.3)
Chloride: 106 mmol/L (ref 98–111)
Chloride: 105 mmol/L (ref 98–111)
Creatinine, Ser: 0.65 mg/dL (ref 0.44–1.00)
Creatinine, Ser: 0.62 mg/dL (ref 0.44–1.00)
GFR, Estimated: >60 mL/min (ref >60)
GFR, Estimated: >60 mL/min (ref >60)
Glucose, Bld: 97 mg/dL (ref 70–99)
Glucose, Bld: 89 mg/dL (ref 70–99)
Potassium: 3.5 mmol/L (ref 3.5–5.1)
Potassium: 3.4 mmol/L — ABNORMAL LOW (ref 3.5–5.1)
Sodium: 137 mmol/L (ref 135–145)
Sodium: 136 mmol/L (ref 135–145)
Total Bilirubin: 0.6 mg/dL (ref 0.0–1.2)
Total Bilirubin: 0.6 mg/dL (ref 0.0–1.2)
Total Protein: 5.9 g/dL — ABNORMAL LOW (ref 6.5–8.1)
Total Protein: 6.1 g/dL — ABNORMAL LOW (ref 6.5–8.1)

## 2024-07-28 LAB — GLUCOSE, CAPILLARY
Glucose-Capillary: 96 mg/dL (ref 70–99)
Glucose-Capillary: 85 mg/dL (ref 70–99)
Glucose-Capillary: 94 mg/dL (ref 70–99)
Glucose-Capillary: 83 mg/dL (ref 70–99)
Glucose-Capillary: 84 mg/dL (ref 70–99)

## 2024-07-28 LAB — PROTIME-INR
INR: 1.1 (ref 0.8–1.2)
INR: 1.2 (ref 0.8–1.2)
Prothrombin Time: 15.2 s (ref 11.4–15.2)
Prothrombin Time: 15.8 s — ABNORMAL HIGH (ref 11.4–15.2)

## 2024-07-28 LAB — ACETAMINOPHEN LEVEL: Acetaminophen (Tylenol), Serum: <10 ug/mL — ABNORMAL LOW (ref 10–30)

## 2024-07-28 MED ORDER — POTASSIUM CHLORIDE CRYS ER 20 MEQ PO TBCR
40.0000 meq | EXTENDED_RELEASE_TABLET | Freq: Once | ORAL | Status: DC
  Filled 2024-07-28: qty 2

## 2024-07-28 MED ORDER — POTASSIUM CHLORIDE CRYS ER 20 MEQ PO TBCR: 40.0000 meq | EXTENDED_RELEASE_TABLET | Freq: Once | ORAL | Status: DC

## 2024-07-28 MED ORDER — POTASSIUM CHLORIDE 20 MEQ PO PACK
40.0000 meq | PACK | Freq: Once | ORAL | Status: AC
  Administered 2024-07-28: 40 meq via ORAL
  Filled 2024-07-28: qty 2

## 2024-07-28 MED ORDER — PHENOL 1.4 % MT LIQD
1.0000 | OROMUCOSAL | Status: DC | PRN
  Filled 2024-07-28: qty 177

## 2024-07-28 MED ORDER — ENOXAPARIN SODIUM 40 MG/0.4ML IJ SOSY
40.0000 mg | PREFILLED_SYRINGE | INTRAMUSCULAR | Status: DC
  Administered 2024-07-28 – 2024-07-29 (×2): 40 mg via SUBCUTANEOUS
  Filled 2024-07-28 (×2): qty 0.4

## 2024-07-28 NOTE — Progress Notes (Signed)
 Pharmacy Note   Contacted poison control 10/25 0150 to report updated labs:  APAP <10 INR 1.1 AST/ALT 16/14  Poison control also requested ECG info and latest VS, which were provided.  They recommend discontinuing N-acetylcysteine.  D/w APaliwal MD who agrees to d/c NAC when current bag is complete.  D/w RN [place] who is aware to d/c NAC ~0600.  Marvetta Dauphin, PharmD, BCPS 07/28/2024 2:15 AM

## 2024-07-28 NOTE — Evaluation (Signed)
 Physical Therapy Brief Evaluation and Discharge Note Patient Details Name: Suzanne Garcia MRN: [id number] DOB: August 09, 2001 Today's Date: 07/28/2024   History of Present Illness  23 y.o. female admitted 10/23 following intentional Tylenol  overdose of an estimated 30 g. 10/24: Intubated, sedated, extubated. past medical history of Anxiety, Bipolar disorder (HCC), and Depression.  Clinical Impression  Patient evaluated by Physical Therapy with no further acute PT needs identified. All education has been completed and the patient has no further questions. Mobilizing at a mod I level. Mildly decreased gait speed but no overt LOB noted, no assistive device needed. DGI indicates low fall risk. Pt does have some delayed and impaired processing with instructions for tasks, may benefit from OT cognitive assessment. See below for any follow-up Physical Therapy or equipment needs. PT is signing off. Thank you for this referral.        PT Assessment Patient does not need any further PT services  Assistance Needed at Discharge  None    Equipment Recommendations None recommended by PT  Recommendations for Other Services  OT consult    Precautions/Restrictions Precautions Precautions: Fall Recall of Precautions/Restrictions: Intact Restrictions Weight Bearing Restrictions Per Provider Order: No        Mobility  Bed Mobility Rolling: Independent        Transfers Overall transfer level: Modified independent Equipment used: None               General transfer comment: Slow to rise but stable    Ambulation/Gait Ambulation/Gait assistance: Modified independent (Device/Increase time) Gait Distance (Feet): 175 Feet Assistive device: None Gait Pattern/deviations: WFL(Within Functional Limits) Gait Speed: Below normal General Gait Details: Grossly stable with minimal deviations from straight path. Had some processing delays at times with instructions but over all no overt LOB,  buckling, dizziness, or SOB reported.  Home Activity Instructions Home Activity Instructions: Gradually increase as tolerated.  Stairs            Modified Rankin (Stroke Patients Only)        Balance Overall balance assessment: Mild deficits observed, not formally tested Sitting-balance support: No upper extremity supported, Feet supported Sitting balance-Leahy Scale: Normal           Standardized Balance Assessment Standardized Balance Assessment : Dynamic Gait Index Dynamic Gait Index Level Surface: Normal Change in Gait Speed: Mild Impairment Gait with Horizontal Head Turns: Normal Gait with Vertical Head Turns: Normal Gait and Pivot Turn: Mild Impairment Step Over Obstacle: Mild Impairment Step Around Obstacles: Normal Steps: Mild Impairment Total Score: 20      Pertinent Vitals/Pain PT - Brief Vital Signs All Vital Signs Stable: Yes Pain Assessment Pain Assessment: Faces Faces Pain Scale: Hurts a little bit Pain Location: headache Pain Descriptors / Indicators: Aching Pain Intervention(s): Limited activity within patient's tolerance, Monitored during session     Home Living Family/patient expects to be discharged to:: Unsure Living Arrangements: Parent           Additional Comments: From home with parents, mother works from home. Pt works and goes to school. Apartment with level entrance. Tub with no DME.    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    UE/LE Assessment   UE ROM/Strength/Tone/Coordination: WFL    LE ROM/Strength/Tone/Coordination: [facility]      Communication   Communication Communication: No apparent difficulties     Cognition Overall Cognitive Status: Appears within functional limits for tasks assessed/performed (Some delay  in processing.)       General Comments General comments (skin integrity, edema, etc.): Educated on safety and symptom  awareness, graduall progression with activities.    Exercises     Assessment/Plan    PT Problem List         PT Visit Diagnosis Other abnormalities of gait and mobility (R26.89)    No Skilled PT All education completed;Patient will have necessary level of assist by caregiver at discharge;Patient is modified independent with all activity/mobility   Co-evaluation                AMPAC 6 Clicks Help needed turning from your back to your side while in a flat bed without using bedrails?: None Help needed moving from lying on your back to sitting on the side of a flat bed without using bedrails?: None Help needed moving to and from a bed to a chair (including a wheelchair)?: None Help needed standing up from a chair using your arms (e.g., wheelchair or bedside chair)?: None Help needed to walk in hospital room?: None Help needed climbing 3-5 steps with a railing? : None 6 Click Score: 24      End of Session   Activity Tolerance: Patient tolerated treatment well Patient left: in bed;with call bell/phone within reach;with nursing/sitter in room (Sitter in room)   PT Visit Diagnosis: Other abnormalities of gait and mobility (R26.89)     Time: 1548-1600 PT Time Calculation (min) (ACUTE ONLY): 12 min  Charges:   PT Evaluation $PT Eval Low Complexity: 1 Low      Leontine Roads, PT, DPT [facility]  Re[facility] Services Physical Therapist Office: [phone] Website: [email or web address]   Leontine GORMAN Roads  07/28/2024, 4:50 PM

## 2024-07-28 NOTE — Progress Notes (Signed)
 [facility] ADULT ICU REPLACEMENT PROTOCOL   The patient does apply for the [facility] Adult ICU Electrolyte Replacment Protocol based on the criteria listed below:   1.Exclusion criteria: TCTS, ECMO, Dialysis, and Myasthenia Gravis patients 2. Is GFR >/= 30 ml/min? Yes.    Patient's GFR today is >60 3. Is SCr </= 2? Yes.   Patient's SCr is 0.65 mg/dL 4. Did SCr increase >/= 0.5 in 24 hours? No. 5.[name] weight >40kg  Yes.   6. Abnormal electrolyte(s): K+ = 3.5  7. Electrolytes replaced per protocol 8.  Call MD STAT for K+ </= 2.5, Phos </= 1, or Mag </= 1 Physician:  Haze, eMD   Rosina SAILOR Alphonsus Doyel 07/28/2024 6:03 AM

## 2024-07-28 NOTE — Progress Notes (Signed)
 NAME:  Suzanne Garcia, MRN:  [id number], DOB:  09-07-2001, LOS: 1 ADMISSION DATE:  07/26/2024, CONSULTATION DATE: 10/23 REFERRING MD: Dr. Mannie, CHIEF COMPLAINT: Altered mental status  History of Present Illness:  23 year old female with past medical history as below.  Mother reports she has been under increased stress recently due to work and school.  On 10/23 there was a stressful event regarding her boyfriend, but she was acting pretty normally.  The evening hours she was seen to be acting strange and was shaking violently raising concern for seizure of which she has no history.  EMS was called.  Upon their arrival the patient's mother realized that a bottle of Tylenol  and bottle of Tylenol  PM were both empty.  Her mom estimates that she could have taken as many as 50 Tylenol  PM tablets and 15 Tylenol  Extra Strength tablets.  Upon EMS arrival the patient was lethargic and minimally responsive.  She had a witnessed seizure in the ambulance and received 5 mg of IM Versed.  Upon arrival to [facility] emergency department she was intubated for airway protection.  N-acetylcysteine was initiated.  Poison control was called and made recommendations for labs.  PCCM was asked to admit  Pertinent  Medical History   has a past medical history of Anxiety, Bipolar disorder (HCC), and Depression.   Significant Hospital Events: Including procedures, antibiotic start and stop dates in addition to other pertinent events   10/23: Admitted following intentional Tylenol  overdose of an estimated 30 g. 10/24: Intubated, sedated, EEG negative for seizures, extubated  Interim History / Subjective:  No overnight issues Patient completed N-acetylcysteine infusion Remained afebrile  Objective    Blood pressure 114/75, pulse 93, temperature 98.9 F (37.2 C), temperature source Oral, resp. rate 16, height 4' 10 (1.473 m), weight 70.9 kg, SpO2 98%.    Vent Mode: CPAP;PSV FiO2 (%):  [40 %] 40 % PEEP:  [5  cmH20] 5 cmH20 Pressure Support:  [12 cmH20] 12 cmH20   Intake/Output Summary (Last 24 hours) at 07/28/2024 0920 Last data filed at 07/28/2024 0800 Gross per 24 hour  Intake 946.86 ml  Output 915 ml  Net 31.86 ml   Filed Weights   07/26/24 2226 07/27/24 0210 07/28/24 0416  Weight: 57.6 kg 74 kg 70.9 kg    Examination: General: African-American female female, lying on the bed HEENT: [place]/AT, eyes anicteric.  moist mucus membranes Neuro: Alert, awake following commands Chest: Coarse breath sounds, no wheezes or rhonchi Heart: Regular rate and rhythm, no murmurs or gallops Abdomen: Soft, nontender, nondistended, bowel sounds present  Labs reviewed  Patient Lines/Drains/Airways Status     Active Line/Drains/Airways     Name Placement date Placement time Site Days   Peripheral IV 07/26/24 18 G Anterior;Left;Proximal Forearm 07/26/24  2217  Forearm  2   PICC Double Lumen 07/27/24 Right Brachial 35 cm 2 cm 07/27/24  0944  -- 1   Urethral Catheter Straight-tip 16 Fr. 07/27/24  0216  Straight-tip  1        Resolved problem list   Assessment and Plan  Status post suicidal attempt with intentional overdose of acetaminophen  and diphenhydramine  Lactic acidosis Patient received charcoal Acetaminophen  level was 357 upon initial presentation, repeat level is less than 10 LFTs are stable Completed therapy with [facility] Currently on 1:1 observation for suicidal attempt, she feels depressed Psychiatry is following Lactate has cleared  Acute toxic encephalopathy secondary to toxic ingestion acetaminophen , resolved Mental status has significantly improved Avoid sedation  Acute  respiratory insufficiency, resolved Patient was successfully extubated yesterday Currently on room air  Anxiety/Depression  Holding home meds Psychiatry is following  Patient is medically cleared for psychiatric evaluation and possible inpatient psychiatric admission   Labs   CBC: Recent Labs  Lab  07/26/24 2310 07/26/24 2321 07/26/24 2330 07/27/24 0017 07/27/24 0033 07/27/24 0459  WBC 7.5  --   --  8.4  --   --   NEUTROABS 5.1  --   --   --   --   --   HGB 10.1* 11.2* 11.2* 11.8* 11.9* 13.6  HCT 31.3* 33.0* 33.0* 36.3 35.0* 40.0  MCV 89.4  --   --  88.5  --   --   PLT 305  --   --  351  --   --     Basic Metabolic Panel: Recent Labs  Lab 07/26/24 2310 07/26/24 2321 07/27/24 0017 07/27/24 0033 07/27/24 0327 07/27/24 0459 07/27/24 1028 07/28/24 0044 07/28/24 0708  NA 138   < > 137   < > 137 138 137 137 136  K 3.2*   < > 3.8   < > 5.1 5.1 3.7 3.5 3.4*  CL 107   < > 105  --  107  --  107 106 105  CO2 21*  --  18*  --  19*  --  20* 21* 21*  GLUCOSE 126*   < > 226*  --  183*  --  137* 97 89  BUN 8   < > 8  --  <5*  --  <5* <5* 5*  CREATININE 0.52   < > 0.52  --  0.60  --  0.65 0.65 0.62  CALCIUM 7.0*  --  7.4*  --  7.7*  --  7.7* 8.1* 8.1*  MG 1.5*  --  1.6*  --   --   --  2.6*  --   --   PHOS  --   --  2.9  --   --   --   --   --   --    < > = values in this interval not displayed.   GFR: Estimated Creatinine Clearance: 90.6 mL/min (by C-G formula based on SCr of 0.62 mg/dL). Recent Labs  Lab 07/26/24 2310 07/27/24 0017 07/27/24 0020 07/27/24 0828  WBC 7.5 8.4  --   --   LATICACIDVEN  --   --  3.1* 1.2    Liver Function Tests: Recent Labs  Lab 07/26/24 2310 07/27/24 0327 07/27/24 1028 07/28/24 0044 07/28/24 0708  AST 19 24 19 16  14*  ALT 12 16 16 14 13   ALKPHOS 39 48 42 40 38  BILITOT 0.3 0.8 0.5 0.6 0.6  PROT 5.6* 6.8 6.6 5.9* 6.1*  ALBUMIN 3.2* 3.7 3.5 3.1* 3.2*   No results for input(s): LIPASE, AMYLASE in the last 168 hours. Recent Labs  Lab 07/27/24 0327  AMMONIA 23    ABG    Component Value Date/Time   PHART 7.306 (L) 07/27/2024 0459   PCO2ART 36.2 07/27/2024 0459   PO2ART 204 (H) 07/27/2024 0459   HCO3 18.1 (L) 07/27/2024 0459   TCO2 19 (L) 07/27/2024 0459   ACIDBASEDEF 8.0 (H) 07/27/2024 0459   O2SAT 100 07/27/2024 0459      Coagulation Profile: Recent Labs  Lab 07/27/24 0327 07/28/24 0044 07/28/24 0708  INR 1.0 1.1 1.2    Cardiac Enzymes: No results for input(s): CKTOTAL, CKMB, CKMBINDEX, TROPONINI in the last 168 hours.  HbA1C:  Hgb A1c MFr Bld  Date/Time Value Ref Range Status  07/27/2024 12:17 AM 5.2 4.8 - 5.6 % Final    Comment:    (NOTE) Diagnosis of Diabetes The following HbA1c ranges recommended by the American Diabetes Association (ADA) may be used as an aid in the diagnosis of diabetes mellitus.  Hemoglobin             Suggested A1C NGSP%              Diagnosis  <5.7                   Non Diabetic  5.7-6.4                Pre-Diabetic  >6.4                   Diabetic  <7.0                   Glycemic control for                       adults with diabetes.    12/02/2020 06:24 AM 5.0 4.8 - 5.6 % Final    Comment:    (NOTE) Pre diabetes:          5.7%-6.4%  Diabetes:              >6.4%  Glycemic control for   <7.0% adults with diabetes     CBG: Recent Labs  Lab 07/27/24 1547 07/27/24 1922 07/27/24 2332 07/28/24 0324 07/28/24 0716  GLUCAP 104* 99 105* 96 85      Valinda Novas, MD [facility] See Amion for pager If no response to pager, please call [phone] until 7pm After 7pm, Please call E-link [phone]

## 2024-07-29 ENCOUNTER — Other Ambulatory Visit: Payer: Self-pay

## 2024-07-29 ENCOUNTER — Encounter (HOSPITAL_COMMUNITY): Payer: Self-pay | Admitting: Psychiatry

## 2024-07-29 ENCOUNTER — Inpatient Hospital Stay (HOSPITAL_COMMUNITY)
Admission: AD | Admit: 2024-07-29 | Discharge: 2024-08-03 | DRG: 885 | Disposition: A | Discharge status: Home / Self Care | Source: Intra-hospital

## 2024-07-29 DIAGNOSIS — Z825 Family history of asthma and other chronic lower respiratory diseases: Secondary | ICD-10-CM | POA: Diagnosis not present

## 2024-07-29 DIAGNOSIS — F411 Generalized anxiety disorder: Secondary | ICD-10-CM | POA: Diagnosis present

## 2024-07-29 DIAGNOSIS — Z888 Allergy status to other drugs, medicaments and biological substances status: Secondary | ICD-10-CM | POA: Diagnosis not present

## 2024-07-29 DIAGNOSIS — Z720 Tobacco use: Secondary | ICD-10-CM | POA: Diagnosis not present

## 2024-07-29 DIAGNOSIS — Z833 Family history of diabetes mellitus: Secondary | ICD-10-CM

## 2024-07-29 DIAGNOSIS — X58XXXD Exposure to other specified factors, subsequent encounter: Secondary | ICD-10-CM | POA: Diagnosis present

## 2024-07-29 DIAGNOSIS — T391X2A Poisoning by 4-Aminophenol derivatives, intentional self-harm, initial encounter: Secondary | ICD-10-CM | POA: Diagnosis not present

## 2024-07-29 DIAGNOSIS — Z818 Family history of other mental and behavioral disorders: Secondary | ICD-10-CM | POA: Diagnosis not present

## 2024-07-29 DIAGNOSIS — Z8249 Family history of ischemic heart disease and other diseases of the circulatory system: Secondary | ICD-10-CM | POA: Diagnosis not present

## 2024-07-29 DIAGNOSIS — T391X2D Poisoning by 4-Aminophenol derivatives, intentional self-harm, subsequent encounter: Secondary | ICD-10-CM

## 2024-07-29 DIAGNOSIS — Z820 Family history of epilepsy and other diseases of the nervous system: Secondary | ICD-10-CM | POA: Diagnosis not present

## 2024-07-29 DIAGNOSIS — Z5948 Other specified lack of adequate food: Secondary | ICD-10-CM | POA: Diagnosis not present

## 2024-07-29 DIAGNOSIS — Z716 Tobacco abuse counseling: Secondary | ICD-10-CM | POA: Diagnosis not present

## 2024-07-29 DIAGNOSIS — Z5941 Food insecurity: Secondary | ICD-10-CM | POA: Diagnosis not present

## 2024-07-29 DIAGNOSIS — F410 Panic disorder [episodic paroxysmal anxiety] without agoraphobia: Secondary | ICD-10-CM | POA: Diagnosis present

## 2024-07-29 DIAGNOSIS — F332 Major depressive disorder, recurrent severe without psychotic features: Principal | ICD-10-CM | POA: Diagnosis present

## 2024-07-29 LAB — CBC WITH DIFFERENTIAL/PLATELET
Abs Immature Granulocytes: 0.03 K/uL (ref 0.00–0.07)
Basophils Absolute: 0.1 K/uL (ref 0.0–0.1)
Basophils Relative: 1 %
Eosinophils Absolute: 0.2 K/uL (ref 0.0–0.5)
Eosinophils Relative: 3 %
HCT: 35.1 % — ABNORMAL LOW (ref 36.0–46.0)
Hemoglobin: 11.5 g/dL — ABNORMAL LOW (ref 12.0–15.0)
Immature Granulocytes: 0 %
Lymphocytes Relative: 29 %
Lymphs Abs: 2.3 K/uL (ref 0.7–4.0)
MCH: 28.8 pg (ref 26.0–34.0)
MCHC: 32.8 g/dL (ref 30.0–36.0)
MCV: 87.8 fL (ref 80.0–100.0)
Monocytes Absolute: 0.5 K/uL (ref 0.1–1.0)
Monocytes Relative: 6 %
Neutro Abs: 4.9 K/uL (ref 1.7–7.7)
Neutrophils Relative %: 61 %
Platelets: 327 K/uL (ref 150–400)
RBC: 4 MIL/uL (ref 3.87–5.11)
RDW: 13.1 % (ref 11.5–15.5)
WBC: 7.9 K/uL (ref 4.0–10.5)
nRBC: 0 % (ref 0.0–0.2)

## 2024-07-29 LAB — COMPREHENSIVE METABOLIC PANEL WITH GFR
ALT: 13 U/L (ref 0–44)
AST: 16 U/L (ref 15–41)
Albumin: 3.2 g/dL — ABNORMAL LOW (ref 3.5–5.0)
Alkaline Phosphatase: 42 U/L (ref 38–126)
Anion gap: 9 (ref 5–15)
BUN: 10 mg/dL (ref 6–20)
CO2: 24 mmol/L (ref 22–32)
Calcium: 8.5 mg/dL — ABNORMAL LOW (ref 8.9–10.3)
Chloride: 107 mmol/L (ref 98–111)
Creatinine, Ser: 0.66 mg/dL (ref 0.44–1.00)
GFR, Estimated: >60 mL/min (ref >60)
Glucose, Bld: 92 mg/dL (ref 70–99)
Potassium: 3.6 mmol/L (ref 3.5–5.1)
Sodium: 140 mmol/L (ref 135–145)
Total Bilirubin: 0.5 mg/dL (ref 0.0–1.2)
Total Protein: 5.9 g/dL — ABNORMAL LOW (ref 6.5–8.1)

## 2024-07-29 LAB — MAGNESIUM: Magnesium: 1.7 mg/dL (ref 1.7–2.4)

## 2024-07-29 LAB — PHOSPHORUS: Phosphorus: 3.5 mg/dL (ref 2.5–4.6)

## 2024-07-29 MED ORDER — OLANZAPINE 10 MG IM SOLR: 5.0000 mg | Freq: Three times a day (TID) | INTRAMUSCULAR | Status: DC | PRN

## 2024-07-29 MED ORDER — DOCUSATE SODIUM 100 MG PO CAPS: 100.0000 mg | ORAL_CAPSULE | Freq: Two times a day (BID) | ORAL | Status: DC | PRN

## 2024-07-29 MED ORDER — ALUM & MAG HYDROXIDE-SIMETH 200-200-20 MG/5ML PO SUSP: 30.0000 mL | ORAL | Status: DC | PRN

## 2024-07-29 MED ORDER — HYDROXYZINE HCL 25 MG PO TABS
25.0000 mg | ORAL_TABLET | Freq: Three times a day (TID) | ORAL | Status: DC | PRN
  Administered 2024-07-31: 25 mg via ORAL
  Filled 2024-07-29: qty 1
  Filled 2024-07-29: qty 10

## 2024-07-29 MED ORDER — DOCUSATE SODIUM 50 MG/5ML PO LIQD: 100.0000 mg | Freq: Two times a day (BID) | ORAL | Status: DC | PRN

## 2024-07-29 NOTE — Plan of Care (Signed)
  Problem: Education: Goal: Mental status will improve Outcome: Progressing   Problem: Activity: Goal: Interest or engagement in activities will improve Outcome: Progressing Goal: Sleeping patterns will improve Outcome: Progressing   Problem: Health Behavior/Discharge Planning: Goal: Identification of resources available to assist in meeting health care needs will improve Outcome: Progressing
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
## 2024-07-29 NOTE — TOC Transition Note (Signed)
 Transition of Care [facility]) - Discharge Note   Patient Details  Name: Suzanne Garcia MRN: [id number] Date of Birth: 09-04-2001  [facility] of [facility] CM/SW Contact:  Cena Ligas, LCSW Phone Number: 07/29/2024, 11:50 AM   Clinical Narrative:     Per MD patient ready for DC to [facility]. RN, patient, and facility notified of DC. . DC packet on chart. Patient is under IVC therefore [employer] transport requested for patient.     CSW will sign off for now as social work intervention is no longer needed. Please consult us  again if new needs arise.   Final next level of care: Psychiatric Hospital Barriers to Discharge: Barriers Resolved   Patient Goals and CMS Choice            Discharge Placement              Patient chooses bed at: Other - please specify in the comment section below: ([facility]) Patient to be transferred to facility by: [employer] Name of family member notified: - Patient and family notified of of transfer: 07/29/24  Discharge Plan and Services Additional resources added to the After Visit Summary for   In-house Referral: Clinical Social Work                                   Social Drivers of Health (SDOH) Interventions SDOH Screenings   Food Insecurity: No Food Insecurity (07/27/2024)  Housing: Unknown (07/27/2024)  Transportation Needs: No Transportation Needs (07/27/2024)  Utilities: Not At Risk (07/27/2024)  Alcohol Screen: Low Risk  (12/01/2020)  Depression (PHQ2-9): High Risk (11/30/2021)  Tobacco Use: Low Risk  (07/28/2024)     Readmission Risk Interventions     No data to display

## 2024-07-29 NOTE — Tx Team (Signed)
 Initial Treatment Plan 07/29/2024 6:28 PM Suzanne Garcia FMW:[id number]    PATIENT STRESSORS: Marital or family conflict   Traumatic event     PATIENT STRENGTHS: Capable of independent living  Supportive family/friends    PATIENT IDENTIFIED PROBLEMS: I am suicidal                     DISCHARGE CRITERIA:  Improved stabilization in mood, thinking, and/or behavior  PRELIMINARY DISCHARGE PLAN: Attend PHP/IOP Outpatient therapy  PATIENT/FAMILY INVOLVEMENT: This treatment plan has been presented to and reviewed with the patient, Suzanne Garcia.  The patient and family have been given the opportunity to ask questions and make suggestions.  Annalee Larch, RN 07/29/2024, 6:28 PM

## 2024-07-29 NOTE — Progress Notes (Signed)
   07/29/24 2100  Psych Admission Type (Psych Patients Only)  Admission Status Involuntary  Psychosocial Assessment  Patient Complaints Depression;Crying spells  Eye Contact Poor  Facial Expression Sad  Affect Depressed;Preoccupied  Speech Soft  Interaction Assertive;Childlike  Motor Activity Restless  Appearance/Hygiene Unremarkable  Behavior Characteristics Appropriate to situation;Anxious  Mood Depressed;Sad  Thought Process  Coherency WDL  Content WDL  Delusions None reported or observed  Perception WDL  Hallucination None reported or observed  Judgment WDL  Confusion None  Danger to Self  Current suicidal ideation? Passive  Self-Injurious Behavior Self-injurious ideation verbalized  Agreement Not to Harm Self Yes  Description of Agreement verbally contracts  Danger to Others  Danger to Others None reported or observed

## 2024-07-29 NOTE — Consult Note (Signed)
 This provider visited the patient earlier this morning and she requested her mother stay at her bedside.  She stated, I just want to go home to which this provider explained the process of needing a psychiatric admission to provide assistance with coping skills to help prevent future suicide attempts.  Suzanne Garcia was then upset about affordability along with her mother who feels it's probably been 100's of thousands of dollars since Thursday when she was admitted and inquired about Medicaid emergency funding.  This provider explained a TOC consult would be placed and the social work team could assist with financial possibilities.  Then, the patient was concerned about a test this Tuesday and her mother worried that she is scheduled to start clinical soon for a home health aide.  This provider reiterated the need for psychiatric care with the seriousness of the overdose.    Her RN was contacted with their concerns and plan to continue with inpatient hospitalization.  A bed was secured at [facility] with transfer in process.  [name], PMHNP

## 2024-07-29 NOTE — Group Note (Signed)
 Date:  07/29/2024 Time:  3:20 PM  Group Topic/Focus:  Bingo - Positive Socialization  The group engaged in a [employer] designed to encourage positive socialization, promote a supportive environment, and enhance cognitive skills. Patients participated actively, demonstrating good communication and cooperation with peers. The activity was structured to create a relaxed, enjoyable atmosphere, fostering a sense of community among participants.  Participation Level:  Did Not Attend  Participation Quality:  Patient not admitted to [facility] at the time of this note.  Affect:  Patient not admitted to [facility] at the time of this note.  Cognitive:  Patient not admitted to [facility] at the time of this note.  Insight: None  Engagement in Group:  Patient not admitted to [facility] at the time of this note.  Modes of Intervention:  Patient not admitted to [facility] at the time of this note.  Additional Comments:  Patient not admitted to [facility] at the time of this note.  Kristi HERO Tamikka Pilger 07/29/2024, 3:20 PM

## 2024-07-29 NOTE — Group Note (Signed)
 Date:  07/29/2024 Time:  7:05 PM  Group Topic/Focus:  Dimensions of Wellness:   The focus of this group is to introduce the topic of wellness and discuss the role each dimension of wellness plays in total health.    Participation Level:  Did Not Attend  Suzanne Garcia 07/29/2024, 7:05 PM

## 2024-07-29 NOTE — Discharge Summary (Signed)
 Physician Discharge Summary  Suzanne Garcia FMW:[id number] DOB: 01/19/2001 DOA: 07/26/2024  PCP: Pcp, No  Admit date: 07/26/2024 Discharge date: 07/29/2024  Admitted From: Home  Discharge disposition: Behavioral health   Recommendations for Outpatient Follow-Up:   Follow-up with behavioral health provider after discharge  Discharge Diagnosis:   Principal Problem:   Intentional acetaminophen  overdose [facility])    Discharge Condition: Improved.  Diet recommendation:   Regular.  Wound care: None.  Code status: Full.   History of Present Illness:   23 year old female with past medical history of anxiety and bipolar disorder presented to the hospital after Tylenol  overdose.  Patient's mother stated that she was under increased stress recently due to work and school.  In the evening hours patient was acting strange and shaking violently with concern for possible seizure.  EMS was called in and the patient's mother realized that the bottle of Tylenol  and Tylenol  PM were empty.  There was a possibility that this might have ingested 50 Tylenol  PM pills and 50 Tylenol  Extra Strength tablets.  In the ED patient was lethargic and minimally responsive and had a witnessed seizure in the ambulance.  Had received 5 mg of IM Versed.  In the ED, patient was intubated for airway protection.  She was then started on acetylcysteine.  Poison control was called in and patient was admitted to the ICU.    Hospital Course:   Following conditions were addressed during hospitalization as listed below,  Status post suicidal attempt with intentional overdose of acetaminophen  and diphenhydramine  Lactic acidosis Patient received charcoal.  Tylenol  level was 357 on presentation.  Latest Tylenol  level less than 10.  LFTs stable.  Has completed treatment with acetylcysteine.  Psychiatry following and is on one-to-one observation for suicidal attempt.  Plan for inpatient psych placement.    Acute toxic  encephalopathy secondary to toxic ingestion acetaminophen , resolved.  Initially was intubated and subsequently extubated.  Mental status has improved at this time.   Acute respiratory insufficiency, resolved Was successfully extubated on 07/27/2024   Anxiety/Depression  Psychiatry on board.  Holding medications from home.   Class I obesity. Body mass index is 32.71 kg/m.  Patient would benefit from lifestyle modifications as outpatient.  Disposition.  At this time, patient is stable for disposition to behavioral health for further care.  Spoke with the patient's mother at bedside  Medical Consultants:   PCCM Psychiatry  Procedures:    Intubation and mechanical ventilation subsequent extubation,   Subjective:   Today, patient was seen and examined at bedside.  Patient denies any nausea vomiting fever chills headache dizziness or lightheadedness.  Has had bowel movement and was able to ambulate.   Discharge Exam:   Vitals:   07/29/24 0811 07/29/24 1103  BP: (!) 132/90 132/81  Pulse: 83 87  Resp: 18 16  Temp: 98.7 F (37.1 C) 98.4 F (36.9 C)  SpO2: 100% 100%   Vitals:   07/29/24 0522 07/29/24 0534 07/29/24 0811 07/29/24 1103  BP: 121/77  (!) 132/90 132/81  Pulse: 77  83 87  Resp: 17  18 16   Temp: 98.4 F (36.9 C)  98.7 F (37.1 C) 98.4 F (36.9 C)  TempSrc: Oral  Oral Oral  SpO2: 99%  100% 100%  Weight:  71 kg    Height:       GENERAL: Patient is alert awake and oriented. Not in obvious distress.  Obese built HENT: No scleral pallor or icterus. Pupils equally reactive to light. Oral mucosa is  moist NECK: is supple, no gross swelling noted. CHEST: Clear to auscultation. No crackles or wheezes.  Diminished breath sounds bilaterally. CVS: S1 and S2 heard, no murmur. Regular rate and rhythm.  ABDOMEN: Soft, non-tender, bowel sounds are present. EXTREMITIES: No edema. CNS: Cranial nerves are intact. No focal motor deficits. SKIN: warm and dry without  rashes.  The results of significant diagnostics from this hospitalization (including imaging, microbiology, ancillary and laboratory) are listed below for reference.     Diagnostic Studies:   EEG adult Result Date: 07/27/2024 Shelton Arlin KIDD, MD     07/27/2024  1:11 PM Patient Name: Suzanne Garcia MRN: [id number] Epilepsy Attending: Arlin KIDD Shelton Referring Physician/Provider: Rosan Deward ORN, NP Date: 07/27/2024 Duration: 22.30 mins Patient history: 23yo F with ams. EEG to evaluate for seizure Level of alertness: Awake AEDs during EEG study: None Technical aspects: This EEG study was done with scalp electrodes positioned according to the 10-20 International system of electrode placement. Electrical activity was reviewed with band pass filter of 1-70Hz , sensitivity of 7 uV/mm, display speed of 23mm/sec with a 60Hz  notched filter applied as appropriate. EEG data were recorded continuously and digitally stored.  Video monitoring was available and reviewed as appropriate. Description: EEG showed continuous generalized 6-9Hz  theta-alpha activity admixed with 12-13hz  beta activity. Hyperventilation and photic stimulation were not performed.   ABNORMALITY - Continuous slow, generalized IMPRESSION: This study is suggestive of generalized cerebral dysfunction (encephalopathy). No seizures or epileptiform discharges were seen throughout the recording. Priyanka KIDD Shelton   US  EKG SITE RITE Result Date: 07/27/2024 If Site Rite image not attached, placement could not be confirmed due to current cardiac rhythm.  DG Chest Portable 1 View Result Date: 07/26/2024 CLINICAL DATA:  Intubated EXAM: PORTABLE CHEST 1 VIEW COMPARISON:  07/09/2019 FINDINGS: Endotracheal tube tip about a cm superior to carina. Enteric tube tip below the diaphragm but incompletely assessed. No acute airspace disease or effusion. Borderline cardiac enlargement. No pneumothorax IMPRESSION: Endotracheal tube tip about a cm superior to  carina. Enteric tube tip below the diaphragm but incompletely assessed. No acute airspace disease. Electronically Signed   By: Luke Bun M.D.   On: 07/26/2024 23:07     Labs:   Basic Metabolic Panel: Recent Labs  Lab 07/26/24 2310 07/26/24 2321 07/27/24 0017 07/27/24 0033 07/27/24 0327 07/27/24 0459 07/27/24 1028 07/28/24 0044 07/28/24 0708 07/29/24 0522  NA 138   < > 137   < > 137 138 137 137 136 140  K 3.2*   < > 3.8   < > 5.1 5.1 3.7 3.5 3.4* 3.6  CL 107   < > 105  --  107  --  107 106 105 107  CO2 21*  --  18*  --  19*  --  20* 21* 21* 24  GLUCOSE 126*   < > 226*  --  183*  --  137* 97 89 92  BUN 8   < > 8  --  <5*  --  <5* <5* 5* 10  CREATININE 0.52   < > 0.52  --  0.60  --  0.65 0.65 0.62 0.66  CALCIUM 7.0*  --  7.4*  --  7.7*  --  7.7* 8.1* 8.1* 8.5*  MG 1.5*  --  1.6*  --   --   --  2.6*  --   --  1.7  PHOS  --   --  2.9  --   --   --   --   --   --  3.5   < > = values in this interval not displayed.   GFR Estimated Creatinine Clearance: 90.6 mL/min (by C-G formula based on SCr of 0.66 mg/dL). Liver Function Tests: Recent Labs  Lab 07/27/24 0327 07/27/24 1028 07/28/24 0044 07/28/24 0708 07/29/24 0522  AST 24 19 16  14* 16  ALT 16 16 14 13 13   ALKPHOS 48 42 40 38 42  BILITOT 0.8 0.5 0.6 0.6 0.5  PROT 6.8 6.6 5.9* 6.1* 5.9*  ALBUMIN 3.7 3.5 3.1* 3.2* 3.2*   No results for input(s): LIPASE, AMYLASE in the last 168 hours. Recent Labs  Lab 07/27/24 0327  AMMONIA 23   Coagulation profile Recent Labs  Lab 07/27/24 0327 07/28/24 0044 07/28/24 0708  INR 1.0 1.1 1.2    CBC: Recent Labs  Lab 07/26/24 2310 07/26/24 2321 07/26/24 2330 07/27/24 0017 07/27/24 0033 07/27/24 0459 07/29/24 0522  WBC 7.5  --   --  8.4  --   --  7.9  NEUTROABS 5.1  --   --   --   --   --  4.9  HGB 10.1*   < > 11.2* 11.8* 11.9* 13.6 11.5*  HCT 31.3*   < > 33.0* 36.3 35.0* 40.0 35.1*  MCV 89.4  --   --  88.5  --   --  87.8  PLT 305  --   --  351  --   --  327    < > = values in this interval not displayed.   Cardiac Enzymes: No results for input(s): CKTOTAL, CKMB, CKMBINDEX, TROPONINI in the last 168 hours. BNP: Invalid input(s): POCBNP CBG: Recent Labs  Lab 07/28/24 0324 07/28/24 0716 07/28/24 1142 07/28/24 1244 07/28/24 1625  GLUCAP 96 85 94 83 84   D-Dimer No results for input(s): DDIMER in the last 72 hours. Hgb A1c Recent Labs    07/27/24 0017  HGBA1C 5.2   Lipid Profile No results for input(s): CHOL, HDL, LDLCALC, TRIG, CHOLHDL, LDLDIRECT in the last 72 hours. Thyroid function studies No results for input(s): TSH, T4TOTAL, T3FREE, THYROIDAB in the last 72 hours.  Invalid input(s): FREET3 Anemia work up No results for input(s): VITAMINB12, FOLATE, FERRITIN, TIBC, IRON, RETICCTPCT in the last 72 hours. Microbiology Recent Results (from the past 240 hours)  MRSA Next Gen by PCR, Nasal     Status: None   Collection Time: 07/27/24  2:51 AM   Specimen: Nasal Mucosa; Nasal Swab  Result Value Ref Range Status   MRSA by PCR Next Gen NOT DETECTED NOT DETECTED Final    Comment: (NOTE) The GeneXpert MRSA Assay (FDA approved for NASAL specimens only), is one component of a comprehensive MRSA colonization surveillance program. It is not intended to diagnose MRSA infection nor to guide or monitor treatment for MRSA infections. Test performance is not FDA approved in patients less than 70 years old. Performed at [facility], [address], [place], [place] [address]      Discharge Instructions:   Discharge Instructions     Call MD for:  persistant nausea and vomiting   Complete by: As directed    Call MD for:  severe uncontrolled pain   Complete by: As directed    Diet general   Complete by: As directed    Discharge instructions   Complete by: As directed    Follow-up as per behavioral health   Increase activity slowly   Complete by: As directed       Allergies  as of  07/29/2024       Reactions   Ibuprofen     Patient states that it causes stomach thinning         Medication List    You have not been prescribed any medications.       Time coordinating discharge: 39 minutes  Signed:  Lynia Landry  [facility] 07/29/2024, 12:51 PM

## 2024-07-29 NOTE — Progress Notes (Signed)
 PROGRESS NOTE  Suzanne Garcia FMW:[id number] DOB: [birth year] DOA: 07/26/2024 PCP: Pcp, No   LOS: 2 days   Brief narrative:  23 year old female with past medical history of anxiety and bipolar disorder presented to the hospital after Tylenol  overdose.  Patient's mother stated that she was under increased stress recently due to work and school.  In the evening hours patient was acting strange and shaking violently with concern for possible seizure.  EMS was called in and the patient's mother realized that the bottle of Tylenol  and Tylenol  PM were empty.  There was a possibility that this might have ingested 50 Tylenol  PM pills and 50 Tylenol  Extra Strength tablets.  In the ED patient was lethargic and minimally responsive and had a witnessed seizure in the ambulance.  Had received 5 mg of IM Versed.  In the ED, patient was intubated for airway protection.  She was then started on acetylcysteine.  Poison control was called in and patient was admitted to the ICU.    Assessment/Plan: Principal Problem:   Intentional acetaminophen  overdose (HCC)  Status post suicidal attempt with intentional overdose of acetaminophen  and diphenhydramine  Lactic acidosis Patient received charcoal.  Tylenol  level was 357 on presentation.  Latest Tylenol  level less than 10.  LFTs stable.  Has completed treatment with acetylcysteine.  Psychiatry following and is on one-to-one observation for suicidal attempt.  Plan for inpatient psych placement.   Acute toxic encephalopathy secondary to toxic ingestion acetaminophen , resolved.  Initially was intubated and subsequently extubated.  Mental status has improved at this time.   Acute respiratory insufficiency, resolved Was successfully extubated on 07/27/2024   Anxiety/Depression  Psychiatry on board.  Holding medications from home.  Class I obesity. Body mass index is 32.71 kg/m.  Patient would benefit from lifestyle modifications as outpatient.  DVT prophylaxis:  enoxaparin (LOVENOX) injection 40 mg Start: 07/28/24 1015 SCDs Start: 07/28/24 9073   Disposition: Inpatient behavioral unit as per psychiatry.  Patient is medically stable for disposition to psychiatric unit.  Status is: Inpatient Remains inpatient appropriate because: Need for psych admission,    Code Status:     Code Status: Full Code  Family Communication: Spoke with the patient's mother at bedside  Consultants: Psychiatry PCCM  Procedures: Intubation and mechanical ventilation subsequent extubation,  Anti-infectives:  None  Anti-infectives (From admission, onward)    None       Subjective: Today, patient was seen and examined at bedside.  Patient denies any nausea vomiting fever chills headache dizziness or lightheadedness.  Has had bowel movement and was able to ambulate.  Objective: Vitals:   07/29/24 0811 07/29/24 1103  BP: (!) 132/90 132/81  Pulse: 83 87  Resp: 18 16  Temp: 98.7 F (37.1 C) 98.4 F (36.9 C)  SpO2: 100% 100%    Intake/Output Summary (Last 24 hours) at 07/29/2024 1112 Last data filed at 07/29/2024 0522 Gross per 24 hour  Intake 358 ml  Output 650 ml  Net -292 ml   Filed Weights   07/28/24 0416 07/28/24 1228 07/29/24 0534  Weight: 70.9 kg 71 kg 71 kg   Body mass index is 32.71 kg/m.   Physical Exam: GENERAL: Patient is alert awake and oriented. Not in obvious distress.  Obese built HENT: No scleral pallor or icterus. Pupils equally reactive to light. Oral mucosa is moist NECK: is supple, no gross swelling noted. CHEST: Clear to auscultation. No crackles or wheezes.  Diminished breath sounds bilaterally. CVS: S1 and S2 heard, no murmur. Regular rate and  rhythm.  ABDOMEN: Soft, non-tender, bowel sounds are present. EXTREMITIES: No edema. CNS: Cranial nerves are intact. No focal motor deficits. SKIN: warm and dry without rashes.  Data Review: I have personally reviewed the following laboratory data and  studies,  CBC: Recent Labs  Lab 07/26/24 2310 07/26/24 2321 07/26/24 2330 07/27/24 0017 07/27/24 0033 07/27/24 0459 07/29/24 0522  WBC 7.5  --   --  8.4  --   --  7.9  NEUTROABS 5.1  --   --   --   --   --  4.9  HGB 10.1*   < > 11.2* 11.8* 11.9* 13.6 11.5*  HCT 31.3*   < > 33.0* 36.3 35.0* 40.0 35.1*  MCV 89.4  --   --  88.5  --   --  87.8  PLT 305  --   --  351  --   --  327   < > = values in this interval not displayed.   Basic Metabolic Panel: Recent Labs  Lab 07/26/24 2310 07/26/24 2321 07/27/24 0017 07/27/24 0033 07/27/24 0327 07/27/24 0459 07/27/24 1028 07/28/24 0044 07/28/24 0708 07/29/24 0522  NA 138   < > 137   < > 137 138 137 137 136 140  K 3.2*   < > 3.8   < > 5.1 5.1 3.7 3.5 3.4* 3.6  CL 107   < > 105  --  107  --  107 106 105 107  CO2 21*  --  18*  --  19*  --  20* 21* 21* 24  GLUCOSE 126*   < > 226*  --  183*  --  137* 97 89 92  BUN 8   < > 8  --  <5*  --  <5* <5* 5* 10  CREATININE 0.52   < > 0.52  --  0.60  --  0.65 0.65 0.62 0.66  CALCIUM 7.0*  --  7.4*  --  7.7*  --  7.7* 8.1* 8.1* 8.5*  MG 1.5*  --  1.6*  --   --   --  2.6*  --   --  1.7  PHOS  --   --  2.9  --   --   --   --   --   --  3.5   < > = values in this interval not displayed.   Liver Function Tests: Recent Labs  Lab 07/27/24 0327 07/27/24 1028 07/28/24 0044 07/28/24 0708 07/29/24 0522  AST 24 19 16  14* 16  ALT 16 16 14 13 13   ALKPHOS 48 42 40 38 42  BILITOT 0.8 0.5 0.6 0.6 0.5  PROT 6.8 6.6 5.9* 6.1* 5.9*  ALBUMIN 3.7 3.5 3.1* 3.2* 3.2*   No results for input(s): LIPASE, AMYLASE in the last 168 hours. Recent Labs  Lab 07/27/24 0327  AMMONIA 23   Cardiac Enzymes: No results for input(s): CKTOTAL, CKMB, CKMBINDEX, TROPONINI in the last 168 hours. BNP (last 3 results) No results for input(s): BNP in the last 8760 hours.  ProBNP (last 3 results) No results for input(s): PROBNP in the last 8760 hours.  CBG: Recent Labs  Lab 07/28/24 0324  07/28/24 0716 07/28/24 1142 07/28/24 1244 07/28/24 1625  GLUCAP 96 85 94 83 84   Recent Results (from the past 240 hours)  MRSA Next Gen by PCR, Nasal     Status: None   Collection Time: 07/27/24  2:51 AM   Specimen: Nasal Mucosa; Nasal Swab  Result Value Ref  Range Status   MRSA by PCR Next Gen NOT DETECTED NOT DETECTED Final    Comment: (NOTE) The GeneXpert MRSA Assay (FDA approved for NASAL specimens only), is one component of a comprehensive MRSA colonization surveillance program. It is not intended to diagnose MRSA infection nor to guide or monitor treatment for MRSA infections. Test performance is not FDA approved in patients less than 8 years old. Performed at [facility], [address], [place], [place] [address]      Studies: No results found.    Vernal Alstrom, MD  [facility] 07/29/2024  If 7PM-7AM, please contact night-coverage

## 2024-07-29 NOTE — BHH Group Notes (Signed)
 Adult Psychoeducational Group Note  Date:  07/29/2024 Time:  9:22 PM  Group Topic/Focus:  Wrap-Up Group:   The focus of this group is to help patients review their daily goal of treatment and discuss progress on daily workbooks.  Participation Level:  Active  Participation Quality:  Appropriate  Affect:  Appropriate  Cognitive:  Appropriate  Insight: Appropriate  Engagement in Group:  Engaged  Modes of Intervention:  Discussion  Additional Comments:  Patient attended and participated in the Wrap-up group.  Suzanne Garcia 07/29/2024, 9:22 PM

## 2024-07-29 NOTE — Hospital Course (Signed)
 23 year old female with past medical history of anxiety and bipolar disorder presented to the hospital after Tylenol  overdose.  Patient's mother stated that she was under increased stress recently due to work and school.  In the evening hours patient was acting strange and shaking violently with concern for possible seizure.  EMS was called in and the patient's mother realized that the bottle of Tylenol  and Tylenol  PM were empty.  There was a possibility that this might have ingested 50 Tylenol  PM pills and 50 Tylenol  Extra Strength tablets.  In the ED patient was lethargic and minimally responsive and had a witnessed seizure in the ambulance.  Had received 5 mg of IM Versed.  In the ED patient was intubated for airway protection.  She was then started on acetylcysteine.  Poison control was called in and patient was admitted to the ICU.   Status post suicidal attempt with intentional overdose of acetaminophen  and diphenhydramine  Lactic acidosis Patient received charcoal.  Tylenol  level was 357 on presentation.  Latest level less than 10.  LFTs stable.  Has completed treatment with [facility].  Psychiatry following and is on one-to-one observation for suicidal attempt.   Acute toxic encephalopathy secondary to toxic ingestion acetaminophen , resolved.  Initially was intubated and subsequently extubated.  Mental status has improved at this time.   Acute respiratory insufficiency, resolved Was successfully extubated on 07/27/2024   Anxiety/Depression  Psychiatry on board.  Holding medications from home.

## 2024-07-29 NOTE — Plan of Care (Signed)
  Problem: Coping: Goal: Ability to adjust to condition or change in health will improve Outcome: Not Progressing   Problem: Education: Goal: Ability to describe self-care measures that may prevent or decrease complications (Diabetes Survival Skills Education) will improve Outcome: Not Progressing Goal: Individualized Educational Video(s) Outcome: Not Progressing   Problem: Education: Goal: Knowledge of General Education information will improve Description: Including pain rating scale, medication(s)/side effects and non-pharmacologic comfort measures Outcome: Not Progressing   Problem: Clinical Measurements: Goal: Diagnostic test results will improve Outcome: Not Progressing   Problem: Elimination: Goal: Will not experience complications related to urinary retention Outcome: Not Progressing

## 2024-07-29 NOTE — Evaluation (Signed)
 Occupational Therapy Evaluation Patient Details Name: Suzanne Garcia MRN: [id number] DOB: 10-09-2000 Today's Date: 07/29/2024   History of Present Illness   23 y.o. female admitted 10/23 following intentional Tylenol  overdose of an estimated 30 g. 10/24: Intubated, sedated, extubated. past medical history of Anxiety, Bipolar disorder (HCC), and Depression.     Clinical Impressions Pt currently at independent level for mobility and selfcare.  Cognition for problem solving functional tasks and memory WFLs for gross testing.  Pt will discharge home with her mom at this time.  Do not feel she warrants any further OT needs at this time.        Functional Status Assessment   Patient has not had a recent decline in their functional status     Equipment Recommendations   None recommended by OT      Precautions/Restrictions   Precautions Precautions: None Recall of Precautions/Restrictions: Intact Restrictions Weight Bearing Restrictions Per Provider Order: No     Mobility Bed Mobility Overal bed mobility: Independent                  Transfers Overall transfer level: Independent Equipment used: None                      Balance Overall balance assessment: Independent                                         ADL either performed or assessed with clinical judgement   ADL Overall ADL's : Independent                                       General ADL Comments: Pt independent with all ADLs.  Cognition WFLs for gross testing of memory and divided attention.     Vision Baseline Vision/History: 1 Wears glasses Ability to See in Adequate Light: 0 Adequate Patient Visual Report: No change from baseline Additional Comments: Glasses not present at this time     Perception Perception: Not tested       Praxis Praxis: Not tested       Pertinent Vitals/Pain Pain Assessment Pain Assessment: No/denies pain      Extremity/Trunk Assessment Upper Extremity Assessment Upper Extremity Assessment: Overall WFL for tasks assessed   Lower Extremity Assessment Lower Extremity Assessment: Defer to PT evaluation   Cervical / Trunk Assessment Cervical / Trunk Assessment: Normal   Communication Communication Communication: No apparent difficulties   Cognition Arousal: Alert Behavior During Therapy: WFL for tasks assessed/performed Cognition: No apparent impairments                               Following commands: Intact                  Home Living Family/patient expects to be discharged to:: Private residence Living Arrangements: Parent Available Help at Discharge: Family                             Additional Comments: From home with parents, mother works from home. Pt works currently at [employer] and goes to school for [employer]. Apartment with level entrance. Tub with no DME.      Prior Functioning/Environment Prior Level  of Function : Independent/Modified Independent                                    AM-PAC OT 6 Clicks Daily Activity     Outcome Measure Help from another person eating meals?: None Help from another person taking care of personal grooming?: None Help from another person toileting, which includes using toliet, bedpan, or urinal?: None Help from another person bathing (including washing, rinsing, drying)?: None Help from another person to put on and taking off regular upper body clothing?: None Help from another person to put on and taking off regular lower body clothing?: None 6 Click Score: 24   End of Session Nurse Communication: Mobility status  Activity Tolerance: Patient tolerated treatment well Patient left: in bed;with nursing/sitter in room;with family/visitor present                   Time: 1025-1046 OT Time Calculation (min): 21 min Charges:  OT General Charges $OT Visit: 1 Visit OT Evaluation $OT Eval Low  Complexity: 1 Low  Lynwood Constant, OTR/L Acute Rehabilitation Services  Office [phone] 07/29/2024

## 2024-07-30 ENCOUNTER — Encounter (HOSPITAL_COMMUNITY): Payer: Self-pay

## 2024-07-30 DIAGNOSIS — F411 Generalized anxiety disorder: Secondary | ICD-10-CM | POA: Insufficient documentation

## 2024-07-30 MED ORDER — MIRTAZAPINE 15 MG PO TABS
15.0000 mg | ORAL_TABLET | Freq: Every day | ORAL | Status: DC
  Administered 2024-07-31 – 2024-08-02 (×3): 15 mg via ORAL
  Filled 2024-07-30: qty 10
  Filled 2024-07-30 (×4): qty 1

## 2024-07-30 NOTE — Plan of Care (Signed)
   Problem: Education: Goal: Mental status will improve Outcome: Progressing   Problem: Activity: Goal: Interest or engagement in activities will improve Outcome: Progressing
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
## 2024-07-30 NOTE — Plan of Care (Signed)

## 2024-07-30 NOTE — Group Note (Signed)
 Date:  07/30/2024 Time:  4:58 PM  Group Topic/Focus: Occupational Therapy   Pt did attend occupational therapy group   Marlei Glomski R Chellsea Beckers 07/30/2024, 4:58 PM
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
## 2024-07-30 NOTE — Group Note (Signed)
 Date:  07/30/2024 Time:  3:28 PM  Group Topic/Focus: Grief and Loss (Chaplain)   Pt did not attend chaplain group   Randal Yepiz R Tiffancy Moger 07/30/2024, 3:28 PM

## 2024-07-30 NOTE — H&P (Addendum)
 Psychiatric Admission Assessment Adult  Patient Identification: Suzanne Garcia MRN:  [id number] Date of Evaluation:  07/30/2024 Chief Complaint:  Major depressive disorder, recurrent severe without psychotic features (HCC) [F33.2] Principal Diagnosis: Major depressive disorder, recurrent severe without psychotic features (HCC) Diagnosis:  Principal Problem:   Major depressive disorder, recurrent severe without psychotic features (HCC) Active Problems:   Intentional acetaminophen  overdose (HCC)   Generalized anxiety disorder with panic attacks    History of Present Illness: The patient is a 23 year old female who goes by Suzanne Garcia.  She has a past mental health history of 3 previous behavioral health admissions, most recently in 2022 where she presented with suicidal thoughts, primary diagnosis given was MDD.  On the present occasion, the patient took a large overdose of Tylenol  and was found unresponsive with seizure-like activity by her mother.  She had to be intubated and was sent to the ICU.  She is currently admitted to the [facility] on an involuntary basis.  On interview today, the patient presents with depressed affect, poor eye contact.  She does have a linear and logical thought process.  She does not appear internally preoccupied or to be responding to internal stimuli.  She reports pertinent social history as follows, confirmed by the report of her mother.  She graduated from [employer] in [place].  She says that she is productive on a daily basis, working at [employer], as well as completing her work for [employer].  She also reports taking care of numerous chores around the house, such as cooking and cleaning.  She says that she enjoys baking and enjoys journaling.  She says that she lives with her mother and 3 siblings.  There is no alcohol or drug use per mother and UDS/BAL.  Mother denies firearms in the home.  When asked to describe events  that led her to the hospital, she reports in the moment I was thinking about everything that was wrong with my life.  She goes on to explain I have been trying to have a kid with my current boyfriend but he broke up with me, also I am scared of going to jail because I got caught driving without a license.  She says I do not know why I took the overdose, but I regret it.  Psychiatric review of symptoms is notable for moderate depressive symptoms, severe anxious symptoms; no psychotic symptoms, no manic history, and no borderline personality symptomatology.  The patient reports fairly good energy most days.  She denies suicidal thoughts outside of the 6 or 7 days prior to admission.  She reports getting about 5 or 6 hours of sleep per night.  She reports significant difficulty with sleep initiation, laying in bed for 2 to 3 hours each night ruminating on worry thoughts.  She reports generalized anxiety throughout the day.  She reports periodic panic attacks that are severely distressing.  The patient's comments about her strong desire to become pregnant, which she makes it several times during the interview, do seem somewhat unusual.  They do not appear to be a part of psychotic spectrum symptomatology based on her report, her objective appearance, and the report of her mother.  Patient's mother, Steen, [phone].  Social history verified with her as described above.  She says that she was surprised to learn that her daughter took a severe overdose.  She feels that the patient's mental health has been fairly stable over the past few years, and the patient was  not making any suicidal statements before the event.  She asks pertinent questions about the follow-up plan and appears engaged in her daughter's care.  Total Time spent with patient: 45 min  Past Psychiatric History: as above  Is the patient at risk to self? yes Has the patient been a risk to self in the past 6 months? yes Has the  patient been a risk to self within the distant past? no Is the patient a risk to others? no Has the patient been a risk to others in the past 6 months? no Has the patient been a risk to others within the distant past? no  [place] Scale:  Flowsheet Row [facility]) [facility] (Discharged) from 07/26/2024 in [facility] from 04/23/2022 in [facility] at [facility] RISK CATEGORY High Risk High Risk No Risk     Prior Inpatient Therapy: yes Prior Outpatient Therapy: yes  Alcohol Screening:  1. How often do you have a drink containing alcohol?: Never 2. How many drinks containing alcohol do you have on a typical day when you are drinking?: 1 or 2 3. How often do you have six or more drinks on one occasion?: Never AUDIT-C Score: 0 4. How often during the last year have you found that you were not able to stop drinking once you had started?: Never 5. How often during the last year have you failed to do what was normally expected from you because of drinking?: Never 6. How often during the last year have you needed a first drink in the morning to get yourself going after a heavy drinking session?: Never 7. How often during the last year have you had a feeling of guilt of remorse after drinking?: Never 8. How often during the last year have you been unable to remember what happened the night before because you had been drinking?: Never 9. Have you or someone else been injured as a result of your drinking?: No 10. Has a relative or friend or a doctor or another health worker been concerned about your drinking or suggested you cut down?: No Alcohol Use Disorder Identification Test Final Score (AUDIT): 0 Alcohol Brief Interventions/Follow-up: Patient Refused Substance Abuse History in the last 12 months:  yes Consequences of Substance Abuse: negative Previous  Psychotropic Medications: yes Psychological Evaluations: yes Past Medical History:  Past Medical History:  Diagnosis Date   Anxiety    Bipolar disorder (HCC)    Depression     Past Surgical History:  Procedure Laterality Date   NO PAST SURGERIES     Family History:  Family History  Problem Relation Age of Onset   Obesity Mother    Asthma Mother    Miscarriages / Stillbirths Mother    Diabetes Mother    Depression Mother    Mental illness Father    ADD / ADHD Sister    Bipolar disorder Sister    Heart disease Maternal Grandmother    Diabetes Maternal Grandmother    Bipolar disorder Maternal Grandmother    Bipolar disorder Maternal Grandfather    Multiple sclerosis Maternal Grandfather    Family Psychiatric  History: none pertinent Tobacco Screening:  Social History   Tobacco Use  Smoking Status Never  Smokeless Tobacco Never    BH Tobacco Counseling     Are you interested in Tobacco Cessation Medications?  N/A, patient does not use tobacco products Counseled  patient on smoking cessation:  N/A, patient does not use tobacco products Reason Tobacco Screening Not Completed: Patient Refused Screening       Social History:  Social History   Substance and Sexual Activity  Alcohol Use Not Currently     Social History   Substance and Sexual Activity  Drug Use Not Currently    Additional Social History: Marital status: Single Are you sexually active?: Yes What is your sexual orientation?: Heterosexual Has your sexual activity been affected by drugs, alcohol, medication, or emotional stress?: emotional stress causes sex drive to decrease. Does patient have children?: No                         Allergies:   Allergies  Allergen Reactions   Ibuprofen      Patient states that it causes stomach thinning    Lab Results:  Results for orders placed or performed during the hospital encounter of 07/26/24 (from the past 48 hours)  Glucose, capillary      Status: None   Collection Time: 07/28/24  4:25 PM  Result Value Ref Range   Glucose-Capillary 84 70 - 99 mg/dL    Comment: Glucose reference range applies only to samples taken after fasting for at least 8 hours.  CBC with Differential/Platelet     Status: Abnormal   Collection Time: 07/29/24  5:22 AM  Result Value Ref Range   WBC 7.9 4.0 - 10.5 K/uL   RBC 4.00 3.87 - 5.11 MIL/uL   Hemoglobin 11.5 (L) 12.0 - 15.0 g/dL   HCT 64.8 (L) 63.9 - 53.9 %   MCV 87.8 80.0 - 100.0 fL   MCH 28.8 26.0 - 34.0 pg   MCHC 32.8 30.0 - 36.0 g/dL   RDW 86.8 88.4 - 84.4 %   Platelets 327 150 - 400 K/uL   nRBC 0.0 0.0 - 0.2 %   Neutrophils Relative % 61 %   Neutro Abs 4.9 1.7 - 7.7 K/uL   Lymphocytes Relative 29 %   Lymphs Abs 2.3 0.7 - 4.0 K/uL   Monocytes Relative 6 %   Monocytes Absolute 0.5 0.1 - 1.0 K/uL   Eosinophils Relative 3 %   Eosinophils Absolute 0.2 0.0 - 0.5 K/uL   Basophils Relative 1 %   Basophils Absolute 0.1 0.0 - 0.1 K/uL   Immature Granulocytes 0 %   Abs Immature Granulocytes 0.03 0.00 - 0.07 K/uL    Comment: Performed at [facility], [address] [place], [place] [address]  Comprehensive metabolic panel     Status: Abnormal   Collection Time: 07/29/24  5:22 AM  Result Value Ref Range   Sodium 140 135 - 145 mmol/L   Potassium 3.6 3.5 - 5.1 mmol/L   Chloride 107 98 - 111 mmol/L   CO2 24 22 - 32 mmol/L   Glucose, Bld 92 70 - 99 mg/dL    Comment: Glucose reference range applies only to samples taken after fasting for at least 8 hours.   BUN 10 6 - 20 mg/dL   Creatinine, Ser 9.33 0.44 - 1.00 mg/dL   Calcium 8.5 (L) 8.9 - 10.3 mg/dL   Total Protein 5.9 (L) 6.5 - 8.1 g/dL   Albumin 3.2 (L) 3.5 - 5.0 g/dL   AST 16 15 - 41 U/L   ALT 13 0 - 44 U/L   Alkaline Phosphatase 42 38 - 126 U/L   Total Bilirubin 0.5 0.0 - 1.2 mg/dL  GFR, Estimated >60 >60 mL/min    Comment: (NOTE) Calculated using the CKD-EPI Creatinine Equation (2021)    Anion gap 9 5 - 15    Comment:  Performed at [facility], [address] [place], [place] [address]  Magnesium      Status: None   Collection Time: 07/29/24  5:22 AM  Result Value Ref Range   Magnesium  1.7 1.7 - 2.4 mg/dL    Comment: Performed at [facility], [address] [place], [place] [address]  Phosphorus     Status: None   Collection Time: 07/29/24  5:22 AM  Result Value Ref Range   Phosphorus 3.5 2.5 - 4.6 mg/dL    Comment: Performed at [facility], [address] [place], [place] [address]    Blood Alcohol level:  Lab Results  Component Value Date   [facility] <15 07/26/2024   ETH <10 12/02/2020    Metabolic Disorder Labs:  Lab Results  Component Value Date   HGBA1C 5.2 07/27/2024   MPG 102.54 07/27/2024   MPG 96.8 12/02/2020   Lab Results  Component Value Date   PROLACTIN 40.7 (H) 05/30/2018   PROLACTIN 49.0 (H) 05/27/2018   Lab Results  Component Value Date   CHOL 158 12/02/2020   TRIG 28 12/02/2020   HDL 58 12/02/2020   CHOLHDL 2.7 12/02/2020   VLDL 6 12/02/2020   LDLCALC 94 12/02/2020   LDLCALC 70 05/27/2018    Current Medications: Current Facility-Administered Medications  Medication Dose Route Frequency Provider Last Rate Last Admin   alum & mag hydroxide-simeth (MAALOX/MYLANTA) 200-200-20 MG/5ML suspension 30 mL  30 mL Oral Q4H PRN Jacquetta Sharlot GRADE, NP       docusate sodium (COLACE) capsule 100 mg  100 mg Oral BID PRN Pashayan, Alexander S, DO       hydrOXYzine  (ATARAX ) tablet 25 mg  25 mg Oral TID PRN Bobbitt, Shalon E, NP       OLANZapine (ZYPREXA) injection 5 mg  5 mg Intramuscular TID PRN Jacquetta Sharlot GRADE, NP       PTA Medications: No medications prior to admission.    Musculoskeletal: Strength & Muscle Tone: normal Gait & Station: normal Patient leans: NA   Psychiatric Specialty Exam: Physical Exam Constitutional:      Appearance: the patient is not toxic-appearing.  Pulmonary:     Effort: Pulmonary effort is normal.  Neurological:     General:  No focal deficit present.     Mental Status: the patient is alert and oriented to person, place, and time.   Review of Systems  Respiratory:  Negative for shortness of breath.   Cardiovascular:  Negative for chest pain.  Gastrointestinal:  Negative for abdominal pain, constipation, diarrhea, nausea and vomiting.  Neurological:  Negative for headaches.      BP 112/78 (BP Location: Left Arm)   Pulse 92   Temp 98.7 F (37.1 C) (Oral)   Resp 18   Ht 4' 9 (1.448 m)   Wt 72.3 kg   SpO2 99%   BMI 34.49 kg/m   General Appearance: Fairly Groomed  Eye Contact:  Good  Speech:  Clear and Coherent  Volume:  Normal  Mood: Depressed  Affect:  Congruent  Thought Process:  Coherent  Orientation:  Full (Time, Place, and Person)  Thought Content: Logical   Suicidal Thoughts:  No  Homicidal Thoughts:  No  Memory:  Immediate;   Good  Judgement:  fair  Insight:  fair  Psychomotor Activity:  Normal  Concentration:  Concentration: Good  Recall:  Good  Fund of Knowledge: Good  Language: Good  Akathisia:  No  Handed:  not assessed  AIMS (if indicated): not done  Assets:  Communication Skills Desire for Improvement Financial Resources/Insurance Housing Leisure Time Physical Health  ADL's:  Intact  Cognition: WNL  Sleep:  Fair    Treatment Plan Summary: Daily contact with patient to assess and evaluate symptoms and progress in treatment and Medication management  Physician Treatment Plan for Primary Diagnosis: Major depressive disorder, recurrent severe without psychotic features (HCC) Long Term Goal(s): Improvement in symptoms so as ready for discharge  Short Term Goals: Ability to identify changes in lifestyle to reduce recurrence of condition will improve  Physician Treatment Plan for Secondary Diagnosis: Principal Problem:   Major depressive disorder, recurrent severe without psychotic features (HCC) Active Problems:   Intentional acetaminophen  overdose (HCC)   Generalized  anxiety disorder with panic attacks   Long Term Goal(s): Improvement in symptoms so as ready for discharge  Short Term Goals: Ability to verbalize feelings will improve    PLAN: Safety and Monitoring:  --  Involuntary admission to inpatient psychiatric unit for safety, stabilization and treatment  -- Daily contact with patient to assess and evaluate symptoms and progress in treatment  -- Patient's case to be discussed in multi-disciplinary team meeting  -- Observation Level : q15 minute checks  -- Vital signs:  q12 hours  -- Precautions: suicide, elopement, and assault  2. Psychiatric Diagnoses and Treatment:  Major depressive disorder, generalized anxiety disorder - Start Remeron  15 mg nightly, patient reports prior efficacy - Will need referral for psychiatrist and therapist, [facility]      3. Medical Issues Being Addressed:  Patient denies current medical issues and denies taking medications prior to admission Labs reviewed, unremarkable Not on QT prolonging medication   Tobacco Use Disorder  -- Nicotine patch 21mg /24 hours ordered  -- Smoking cessation encouraged  4. Discharge Planning:   -- Social work and case management to assist with discharge planning and identification of hospital follow-up needs prior to discharge  -- Estimated LOS: 5-7 days  -- Discharge Concerns: Need to establish a safety plan; Medication compliance and effectiveness  -- Discharge Goals: Return home with outpatient referrals for mental health follow-up including medication management/psychotherapy   I certify that inpatient services furnished can reasonably be expected to improve the patient's condition.    Karleen Kaufmann, MD PGY-4

## 2024-07-30 NOTE — Group Note (Signed)
 Recreation Therapy Group Note   Group Topic:Leisure Education  Group Date: 07/30/2024 Start Time: 0936 End Time: 1008 Facilitators: Minda Faas-McCall, LRT,CTRS Location: 300 Hall Dayroom   Group Topic: Leisure Education   Goal Area(s) Addresses:  Patient will successfully identify positive leisure and recreation activities.  Patient will acknowledge benefits of participation in healthy leisure activities post discharge.  Patient will actively work with peers toward a shared goal.   Behavioral Response: Engaged   Intervention: Competitive Group Game    Activity: Keep It Contractor. Patients were spread out throughout the room and were to remain seated at all times. Patients were to hit the beach ball to each other as if playing volleyball. Patients were to keep the ball going for as long as they could. Patients would be timed throughout the activity and if the ball came to a complete stop, the time would start over.    Education:  Teacher, English As A Foreign Language, Leisure as [employer] Officer, Programmer, Applications, Building Control Surveyor   Education Outcome: Acknowledges education/In group clarification offered/Needs additional education   Affect/Mood: Appropriate   Participation Level: Engaged   Participation Quality: Independent   Behavior: Appropriate   Speech/Thought Process: Focused   Insight: Good   Judgement: Good   Modes of Intervention: Cooperative Play   Patient Response to Interventions:  Engaged   Education Outcome:  In group clarification offered    Clinical Observations/Individualized Feedback: Pt was quiet but bright during activity. Pt had some moments were she was social with peers. Pt was engaged throughout activity.     Plan: Continue to engage patient in RT group sessions 2-3x/week.   Theodore Virgin-McCall, LRT,CTRS 07/30/2024 12:21 PM

## 2024-07-30 NOTE — Group Note (Signed)
 Date:  07/30/2024 Time:  10:49 AM  Group Topic/Focus: Recreational Therapy   Pt did attend recreational therapy group Suzanne Garcia [name] 07/30/2024, 10:49 AM

## 2024-07-30 NOTE — BH IP Treatment Plan (Signed)
 Interdisciplinary Treatment and Diagnostic Plan Update  07/30/2024 Time of Session: 1400 Suzanne Garcia MRN: [id number]  Principal Diagnosis: Major depressive disorder, recurrent severe without psychotic features (HCC)  Secondary Diagnoses: Principal Problem:   Major depressive disorder, recurrent severe without psychotic features (HCC) Active Problems:   Intentional acetaminophen  overdose (HCC)   Generalized anxiety disorder with panic attacks   Current Medications:  Current Facility-Administered Medications  Medication Dose Route Frequency Provider Last Rate Last Admin   alum & mag hydroxide-simeth (MAALOX/MYLANTA) 200-200-20 MG/5ML suspension 30 mL  30 mL Oral Q4H PRN Jacquetta Sharlot GRADE, NP       docusate sodium (COLACE) capsule 100 mg  100 mg Oral BID PRN Pashayan, Alexander S, DO       hydrOXYzine  (ATARAX ) tablet 25 mg  25 mg Oral TID PRN Bobbitt, Shalon E, NP       mirtazapine  (REMERON ) tablet 15 mg  15 mg Oral QHS Marry Clamp, MD       OLANZapine (ZYPREXA) injection 5 mg  5 mg Intramuscular TID PRN Jacquetta Sharlot GRADE, NP       PTA Medications: No medications prior to admission.    Patient Stressors: Marital or family conflict   Traumatic event    Patient Strengths: Capable of independent living  Supportive family/friends   Treatment Modalities: Medication Management, Group therapy, Case management,  1 to 1 session with clinician, Psychoeducation, Recreational therapy.   Physician Treatment Plan for Primary Diagnosis: Major depressive disorder, recurrent severe without psychotic features (HCC) Long Term Goal(s):   Increase resilience and coping skills  Short Term Goals:   Resolution of suicidal idation  Medication Management: Evaluate patient's response, side effects, and tolerance of medication regimen.  Therapeutic Interventions: 1 to 1 sessions, Unit Group sessions and Medication administration.  Evaluation of Outcomes: Progressing  Physician Treatment Plan  for Secondary Diagnosis: Principal Problem:   Major depressive disorder, recurrent severe without psychotic features (HCC) Active Problems:   Intentional acetaminophen  overdose (HCC)   Generalized anxiety disorder with panic attacks  Long Term Goal(s):     Short Term Goals:       Medication Management: Evaluate patient's response, side effects, and tolerance of medication regimen.  Therapeutic Interventions: 1 to 1 sessions, Unit Group sessions and Medication administration.  Evaluation of Outcomes: Progressing   RN Treatment Plan for Primary Diagnosis: Major depressive disorder, recurrent severe without psychotic features (HCC) Long Term Goal(s): Knowledge of disease and therapeutic regimen to maintain health will improve  Short Term Goals: Ability to remain free from injury will improve, Ability to verbalize frustration and anger appropriately will improve, Ability to demonstrate self-control, Ability to verbalize feelings will improve, Ability to disclose and discuss suicidal ideas, and Ability to identify and develop effective coping behaviors will improve  Medication Management: RN will administer medications as ordered by provider, will assess and evaluate patient's response and provide education to patient for prescribed medication. RN will report any adverse and/or side effects to prescribing provider.  Therapeutic Interventions: 1 on 1 counseling sessions, Psychoeducation, Medication administration, Evaluate responses to treatment, Monitor vital signs and CBGs as ordered, Perform/monitor CIWA, COWS, AIMS and Fall Risk screenings as ordered, Perform wound care treatments as ordered.  Evaluation of Outcomes: Progressing   LCSW Treatment Plan for Primary Diagnosis: Major depressive disorder, recurrent severe without psychotic features (HCC) Long Term Goal(s): Safe transition to appropriate next level of care at discharge, Engage patient in therapeutic group addressing  interpersonal concerns.  Short Term Goals: Engage patient in aftercare  planning with referrals and resources, Increase social support, Increase ability to appropriately verbalize feelings, Increase emotional regulation, Facilitate acceptance of mental health diagnosis and concerns, Facilitate patient progression through stages of change regarding substance use diagnoses and concerns, Identify triggers associated with mental health/substance abuse issues, and Increase skills for wellness and recovery  Therapeutic Interventions: Assess for all discharge needs, 1 to 1 time with Social worker, Explore available resources and support systems, Assess for adequacy in community support network, Educate family and significant other(s) on suicide prevention, Complete Psychosocial Assessment, Interpersonal group therapy.  Evaluation of Outcomes: Progressing   Progress in Treatment: Attending groups: Yes. Participating in groups: Yes. Taking medication as prescribed: Yes. Toleration medication: Yes. Family/Significant other contact made: Yes Patient understands diagnosis: Yes. Discussing patient identified problems/goals with staff: Yes. Medical problems stabilized or resolved: Yes. Denies suicidal/homicidal ideation: Yes. Issues/concerns per patient self-inventory: No.   Patient Goals:  Improve my mental health  Discharge Plan or Barriers: Recent severe SA via OD. Patient requires further stabilization and observation prior to discharge with outpatient follow-up.  Reason for Continuation of Hospitalization: Recent suicide attempt  Estimated Length of Stay: 3-7 days  Last 3 [place] Suicide Severity Risk Score: Flowsheet Row Admission (Current) [facility]-Ad[facility] (Discharged) from 07/26/2024 in [facility] from 04/23/2022 in [facility] RISK CATEGORY High  Risk High Risk No Risk    Last PHQ 2/9 Scores:    11/30/2021    6:21 PM 04/23/2020    9:05 AM  Depression screen PHQ 2/9  Decreased Interest 2 0  Down, Depressed, Hopeless 2 1  PHQ - 2 Score 4 1  Altered sleeping 2 3  Tired, decreased energy 2 2  Change in appetite 2 1  Feeling bad or failure about yourself  2 0  Trouble concentrating 0 0  Moving slowly or fidgety/restless 0 3  Suicidal thoughts 0 0  PHQ-9 Score 12 10  Difficult doing work/chores Very difficult     Scribe for Treatment Team: Oliva DELENA Salmon, DO 07/30/2024 5:21 PM

## 2024-07-30 NOTE — Group Note (Signed)
 Occupational Therapy Group Note  Group Topic:Coping Skills  Group Date: 07/30/2024 Start Time: 1515 End Time: 1600 Facilitators: Dot [place] MATSU, OT   Group Description: Group encouraged increased engagement and participation through discussion and activity focused on Coping Ahead. Patients were split up into teams and selected a card from a stack of positive coping strategies. Patients were instructed to act out/charade the coping skill for other peers to guess and receive points for their team. Discussion followed with a focus on identifying additional positive coping strategies and patients shared how they were going to cope ahead over the weekend while continuing hospitalization stay.  Therapeutic Goal(s): Identify positive vs negative coping strategies. Identify coping skills to be used during hospitalization vs coping skills outside of hospital/at home Increase participation in therapeutic group environment and promote engagement in treatment   Participation Level: Engaged   Participation Quality: Independent   Behavior: Appropriate   Speech/Thought Process: Relevant   Affect/Mood: Appropriate   Insight: Fair   Judgement: Fair      Modes of Intervention: Education  Patient Response to Interventions:  Attentive   Plan: Continue to engage patient in OT groups 2 - 3x/week.  07/30/2024  [place] MATSU Dot, OT  Suzanne Garcia, OT

## 2024-07-30 NOTE — [facility] Group Notes (Signed)
 Spirituality Group   Description: Participant directed exploration of values, beliefs and meaning   Following a brief framework of chaplain's role and ground rules of group behavior, participants are invited to share concerns or questions that engage spiritual life. Emphasis placed on common themes and shared experiences and ways to make meaning and clarify living into one's values.   Theory/Process/Goal: Utilize the theoretical framework of group therapy established by Celena Kite, Relational Cultural Theory and Rogerian approaches to facilitate relational empathy and use of the "here and now" to foster reflection, self-awareness, and sharing.   Observations: Suzanne Garcia was reserved and quiet. Unclear how much she was engaged in the discussion. Present for about 0.75.  Suzanne Garcia L. Delores HERO.Div

## 2024-07-30 NOTE — Progress Notes (Signed)
(  Sleep Hours) -8 (Any PRNs that were needed, meds refused, or side effects to meds)- none (Any disturbances and when (visitation, over night)- crying spells wanting to leave or move to another room (Concerns raised by the patient)- pt, wants to discharge (SI/HI/AVH)-passive, verbally contracts

## 2024-07-30 NOTE — Group Note (Signed)
 Date:  07/30/2024 Time:  9:52 AM  Group Topic/Focus:  Goals Group:   The focus of this group is to help patients establish daily goals to achieve during treatment and discuss how the patient can incorporate goal setting into their daily lives to aide in recovery. Orientation:   The focus of this group is to educate the patient on the purpose and policies of crisis stabilization and provide a format to answer questions about their admission.  The group details unit policies and expectations of patients while admitted.    Participation Level:  Minimal  Participation Quality:  Appropriate  Affect:  Flat  Cognitive:  Appropriate  Insight: Limited  Engagement in Group:  Limited  Modes of Intervention:  Discussion  Additional Comments:    Suzanne Garcia 07/30/2024, 9:52 AM

## 2024-07-30 NOTE — Group Note (Signed)
 Date:  07/30/2024 Time:  12:12 PM  Group Topic/Focus:  Physical Wellness: The group likely aims to help participants improve their cardiovascular health, flexibility, strength, and endurance. Encouraging regular exercise tailored to different fitness levels and goals. Emotional Education:   The focus of this group is to discuss what feelings/emotions are, and how they are experienced. [name] wrote a letter to their future self. This activity focused on: Self-Reflection: The group may want to reflect on their current thoughts, hopes, dreams, and challenges. Writing to their future selves allows them to pause and consider where they are in life and what they want to achieve. Goal Setting: A letter to the future can be a way to articulate personal or group goals. It helps individuals visualize where they hope to be and what they want to have accomplished by a certain time. Motivation: Revisiting a letter written to their future selves can serve as a reminder of the reasons behind their goals, and it can inspire them to stay on track or push through difficult times. Growth Tracking: A letter serves as a time capsule, allowing the group to measure how far they've come in terms of personal or collective growth. It can be a way to compare past expectations with present realities. Connection and Unity: If the group is doing this activity together, it can foster a sense of connection and shared purpose. Writing to the future encourages a sense of solidarity as everyone collectively reflects on their future paths. Coping with Change: For some, the letter may be a tool for dealing with uncertainty or major life transitions. It's a way to anchor themselves in their current state of mind and emotions, so they can see how they've adapted over time.    Participation Level:  Active  Participation Quality:  Appropriate  Affect:  Appropriate  Cognitive:  Appropriate  Insight: Appropriate  Engagement in Group:   Engaged  Modes of Intervention:  Activity and Discussion  Additional Comments:    Suzanne Garcia Suzanne Garcia 07/30/2024, 12:12 PM

## 2024-07-30 NOTE — Progress Notes (Signed)
 D: Patient is alert, oriented, and cooperative. Denies SI, HI, AVH, and verbally contracts for safety. Patient reports she slept good last night without sleeping medication. Patient reports her appetite as good, energy level as normal, and concentration as good. Patient rates her depression 0/10, hopelessness 0/10, and anxiety 8/10.    A: Support provided. Patient educated on safety on the unit and medications. Routine safety checks every 15 minutes. Patient stated understanding to tell nurse about any new physical symptoms. Patient understands to tell staff of any needs.     R: No adverse drug reactions noted. Patient remains safe at this time and will continue to monitor.    07/30/24 1200  Psych Admission Type (Psych Patients Only)  Admission Status Involuntary  Psychosocial Assessment  Patient Complaints None  Eye Contact Fair  Facial Expression Flat  Affect Depressed  Speech Soft  Interaction Childlike  Motor Activity Restless  Appearance/Hygiene Unremarkable  Behavior Characteristics Cooperative;Appropriate to situation  Mood Depressed  Thought Process  Coherency WDL  Content WDL  Delusions None reported or observed  Perception WDL  Hallucination None reported or observed  Judgment WDL  Confusion None  Danger to Self  Current suicidal ideation? Denies  Self-Injurious Behavior No self-injurious ideation or behavior indicators observed or expressed   Agreement Not to Harm Self Yes  Description of Agreement verbal  Danger to Others  Danger to Others None reported or observed

## 2024-07-31 NOTE — Group Note (Signed)
 Date:  07/31/2024 Time:  1:16 PM  Group Topic/Focus:   Crisis Planning:   The purpose of this group is to help patients create a crisis plan for use upon discharge or in the future, as needed. More specifically, setting SMART goals and developing a WRAP plan.     Participation Level:  Pt did attend group   Devyn Griffing D Belmira Daley 07/31/2024, 1:16 PM

## 2024-07-31 NOTE — Group Note (Signed)
 Recreation Therapy Group Note   Group Topic:Animal Assisted Therapy   Group Date: 07/31/2024 Start Time: 0946 End Time: 1030 Facilitators: Rhondalyn Clingan-McCall, LRT,CTRS Location: [address] Dayroom   Animal-Assisted Activity (AAA) Program Checklist/Progress Notes Patient Eligibility Criteria Checklist & Daily Group note for Rec Tx Intervention  AAA/T Program Assumption of Risk Form signed by Patient/ or Parent Legal Guardian Yes  Patient is free of allergies or severe asthma Yes  Patient reports no fear of animals Yes  Patient reports no history of cruelty to animals Yes  Patient understands his/her participation is voluntary Yes  Patient washes hands before animal contact Yes  Patient washes hands after animal contact Yes  Behavioral Response: Engaged   Education: Charity Fundraiser, Appropriate Animal Interaction   Education Outcome: Acknowledges education.    Affect/Mood: Appropriate   Participation Level: Engaged   Participation Quality: Independent   Behavior: Appropriate   Speech/Thought Process: Focused   Insight: Good   Judgement: Good   Modes of Intervention: Teaching Laboratory Technician   Patient Response to Interventions:  Engaged   Education Outcome:  In group clarification offered    Clinical Observations/Individualized Feedback: Patient attended session and interacted appropriately with therapy dog and peers. Patient asked appropriate questions about therapy dog and his training. Patient shared stories about their pets at home with group.      Plan: Continue to engage patient in RT group sessions 2-3x/week.   Javaya [place]-McCall, LRT,CTRS 07/31/2024 12:26 PM

## 2024-07-31 NOTE — Progress Notes (Addendum)
 Pt spent the entire evening in bed resting quietly, she denied SI, plan or intent upon approach. She made brief eye contact and declined to further engage in conversation    07/31/24 0200  Psych Admission Type (Psych Patients Only)  Admission Status Involuntary  Psychosocial Assessment  Patient Complaints None  Eye Contact Brief  Facial Expression Sad  Affect Sad  Speech Soft  Interaction Isolative  Motor Activity Slow  Appearance/Hygiene Unremarkable  Behavior Characteristics Appropriate to situation  Mood Sad  Thought Process  Coherency WDL  Content WDL  Delusions None reported or observed  Perception WDL  Hallucination None reported or observed  Judgment WDL  Confusion None  Danger to Self  Current suicidal ideation? Denies  Agreement Not to Harm Self Yes  Description of Agreement verbal  Danger to Others  Danger to Others None reported or observed

## 2024-07-31 NOTE — Group Note (Signed)
 Date:  07/31/2024 Time:  9:24 AM  Group Topic/Focus:  Goals Group:   The focus of this group is to help patients establish daily goals to achieve during treatment and discuss how the patient can incorporate goal setting into their daily lives to aide in recovery. Orientation:   The focus of this group is to educate the patient on the purpose and policies of crisis stabilization and provide a format to answer questions about their admission.  The group details unit policies and expectations of patients while admitted.    Participation Level:  Active  Participation Quality:  Appropriate  Affect:  Appropriate  Cognitive:  Appropriate  Insight: Appropriate  Engagement in Group:  Developing/Improving  Modes of Intervention:  Discussion and Orientation  Additional Comments:    Mose Colaizzi D Toshiye Kever 07/31/2024, 9:24 AM

## 2024-07-31 NOTE — Group Note (Signed)
 Date:  07/31/2024 Time:  3:55 PM  Group Topic/Focus: Sleep Hygiene Education Dimensions of Wellness:   The focus of this group is to introduce the topic of wellness and discuss the role each dimension of wellness plays in total health.    Participation Level:  Active  Participation Quality:  Appropriate  Affect:  Appropriate  Cognitive:  Appropriate  Insight: Appropriate  Engagement in Group:  Engaged  Modes of Intervention:  Discussion  Additional Comments:  Pt engaged appropriately in group.  Chanel Mckesson D Kemi Gell 07/31/2024, 3:55 PM

## 2024-07-31 NOTE — Plan of Care (Signed)
  Problem: Education: Goal: Knowledge of [facility] Education information/materials will improve Outcome: Not Met (add Reason) Goal: Emotional status will improve Outcome: Not Met (add Reason) Goal: Mental status will improve Outcome: Not Met (add Reason)   Problem: Safety: Goal: Periods of time without injury will increase Outcome: Progressing

## 2024-07-31 NOTE — Group Note (Signed)
 Date:  07/31/2024 Time:  10:57 AM  Group Topic/Focus:  Pet Therapy:   This group aims to reduce stress and anxiety by using trained animals as a therapeutic tool.    Participation Level:  Patient did attend group    Suzanne Garcia D Brach Birdsall 07/31/2024, 10:57 AM

## 2024-07-31 NOTE — Group Note (Signed)
 LCSW Group Therapy Note   Group Date: 07/31/2024 Start Time: 1100 End Time: 1200   Participation:  patient was present.  She listened and was respectful but didn't participate in the discussion.  Type of Therapy:  Group Therapy  Topic: Lifestyle:  from "One Day" to "Today is Day One"  Objective:  To promote mental and physical well-being through lifestyle changes in routine, nutrition, sleep, and movement.  Goals: Increase awareness of how lifestyle habits impact mental health. Encourage one small, achievable wellness goal. Support group sharing and accountability.  Summary:  Group members explored how daily habits influence mental health and discussed the importance of starting with small, manageable changes. Participants identified personal goals and shared reflections on improving structure, sleep, diet, and physical activity.  Therapeutic Modalities: CBT - Identifying and challenging all-or-nothing thinking; promoting realistic, helpful thoughts about change. Psychoeducation - Teaching about the impact of sleep, nutrition, movement, and routine on mental health. Motivational Interviewing - Eliciting personal motivation and exploring readiness for change. Goal-Setting - Supporting SMART goals to build self-efficacy and encourage follow-through.   Suzanne Garcia, LCSWA 07/31/2024  12:26 PM

## 2024-07-31 NOTE — Progress Notes (Signed)
   07/31/24 2200  Psych Admission Type (Psych Patients Only)  Admission Status Involuntary  Psychosocial Assessment  Patient Complaints None  Eye Contact Fair  Facial Expression Flat  Affect Flat  Speech Soft  Interaction Childlike;Guarded  Motor Activity Slow  Appearance/Hygiene Unremarkable  Behavior Characteristics Appropriate to situation  Mood Depressed;Preoccupied  Thought Process  Coherency WDL  Content WDL  Delusions None reported or observed  Perception WDL  Hallucination None reported or observed  Judgment WDL  Confusion None  Danger to Self  Current suicidal ideation? Denies  Self-Injurious Behavior No self-injurious ideation or behavior indicators observed or expressed   Agreement Not to Harm Self Yes  Description of Agreement Verbal  Danger to Others  Danger to Others None reported or observed

## 2024-07-31 NOTE — [facility] Group Notes (Signed)
 Gerrie did not attend wrap up group

## 2024-07-31 NOTE — Plan of Care (Signed)
   Problem: Education: Goal: Knowledge of [facility] Education information/materials will improve Outcome: Progressing Goal: Emotional status will improve Outcome: Progressing Goal: Mental status will improve Outcome: Progressing Goal: Verbalization of understanding the information provided will improve Outcome: Progressing

## 2024-07-31 NOTE — Progress Notes (Addendum)
 Patient denies SI/HI/AVH this morning. Pt denies having depression this morning. Pt reports having mild anxiety this morning. Pt reports that she slept well last night. Pt is preoccupied with wanting to discharge and is wanting to know what her expected discharge date is. Pt has been interactive on the unit and participating in groups throughout the day. Pt has been calm and cooperative throughout the day. Patient has been compliant with medications and treatment plan. Q 15 minute safety checks are in place for patient's safety. Patient is currently safe on the unit.  PRN hydroxyzine  given for anxiety   07/31/24 0849  Psych Admission Type (Psych Patients Only)  Admission Status Involuntary  Psychosocial Assessment  Patient Complaints None  Eye Contact Fair  Facial Expression Flat  Affect Depressed  Speech Soft  Interaction Guarded  Motor Activity Slow  Appearance/Hygiene Unremarkable  Behavior Characteristics Appropriate to situation  Mood Sad  Thought Process  Coherency WDL  Content WDL  Delusions None reported or observed  Perception WDL  Hallucination None reported or observed  Judgment WDL  Confusion None  Danger to Self  Current suicidal ideation? Denies  Self-Injurious Behavior No self-injurious ideation or behavior indicators observed or expressed   Agreement Not to Harm Self Yes  Description of Agreement Pt verbally contracts for safety  Danger to Others  Danger to Others None reported or observed

## 2024-07-31 NOTE — Progress Notes (Signed)
 [facility] MD Progress Note  07/31/2024 3:59 PM Suzanne Garcia  MRN:  [id number]  Principal Problem: Major depressive disorder, recurrent severe without psychotic features (HCC) Diagnosis: Principal Problem:   Major depressive disorder, recurrent severe without psychotic features (HCC) Active Problems:   Intentional acetaminophen  overdose (HCC)   Generalized anxiety disorder with panic attacks    Reason for Admission:  The patient is a 23 year old female who goes by Suzanne Garcia.  She has a past mental health history of 3 previous behavioral health admissions, most recently in 2022 where she presented with suicidal thoughts, primary diagnosis given was MDD.  On the present occasion, the patient took a large overdose of Tylenol  and was found unresponsive with seizure-like activity by her mother.  She had to be intubated and was sent to the ICU.  She is currently admitted to the [facility] on an involuntary basis.    Information obtained from 24-hour nursing report: No remarkable events   Information Obtained Today During Patient Interview:  The patient continues to present with a reserved and somewhat anxious affect.  She engages fairly well on interview.  She is able to reflect on the severity of her overdose and says that she wants to make changes in her life it will improve her mental health.  She reports good sleep.  Denies any issues with medication.  She feels that she is getting used to the unit and is more comfortable here.  She is seen in group at multiple points.  Total Time spent with patient: 20 min  Past Psychiatric History: as per H and P  Past Medical History:  Past Medical History:  Diagnosis Date   Anxiety    Bipolar disorder (HCC)    Depression     Past Surgical History:  Procedure Laterality Date   NO PAST SURGERIES     Family History:  Family History  Problem Relation Age of Onset   Obesity Mother    Asthma Mother    Miscarriages / Stillbirths Mother     Diabetes Mother    Depression Mother    Mental illness Father    ADD / ADHD Sister    Bipolar disorder Sister    Heart disease Maternal Grandmother    Diabetes Maternal Grandmother    Bipolar disorder Maternal Grandmother    Bipolar disorder Maternal Grandfather    Multiple sclerosis Maternal Grandfather    Family Psychiatric  History: as per H and P Social History:  Social History   Substance and Sexual Activity  Alcohol Use Not Currently     Social History   Substance and Sexual Activity  Drug Use Not Currently    Social History   Socioeconomic History   Marital status: Single    Spouse name: Not on file   Number of children: Not on file   Years of education: Not on file   Highest education level: Not on file  Occupational History   Not on file  Tobacco Use   Smoking status: Never   Smokeless tobacco: Never  Vaping Use   Vaping status: Never Used  Substance and Sexual Activity   Alcohol use: Not Currently   Drug use: Not Currently   Sexual activity: Yes    Partners: Male    Birth control/protection: None  Other Topics Concern   Not on file  Social History Narrative   Born at [facility] in [place]. Jaundice in NBN.      Social Drivers of Corporate Investment Banker Strain:  Not on file  Food Insecurity: Food Insecurity Present (07/29/2024)   Hunger Vital Sign    Worried About Running Out of Food in the Last Year: Sometimes true    Ran Out of Food in the Last Year: Sometimes true  Transportation Needs: No Transportation Needs (07/29/2024)   PRAPARE - Administrator, Civil Service (Medical): No    Lack of Transportation (Non-Medical): No  Physical Activity: Not on file  Stress: Not on file  Social Connections: Unknown (07/29/2024)   Social Connection and Isolation Panel    Frequency of Communication with Friends and Family: More than three times a week    Frequency of Social Gatherings with Friends and Family: More than three times a week     Attends Religious Services: Not on Marketing Executive or Organizations: Yes    Attends Banker Meetings: 1 to 4 times per year    Marital Status: Never married   Additional Social History:                         Sleep: fair  Appetite: fair  Current Medications: Current Facility-Administered Medications  Medication Dose Route Frequency Provider Last Rate Last Admin   alum & mag hydroxide-simeth (MAALOX/MYLANTA) 200-200-20 MG/5ML suspension 30 mL  30 mL Oral Q4H PRN Jacquetta Sharlot GRADE, NP       docusate sodium (COLACE) capsule 100 mg  100 mg Oral BID PRN Pashayan, Alexander S, DO       hydrOXYzine  (ATARAX ) tablet 25 mg  25 mg Oral TID PRN Bobbitt, Shalon E, NP   25 mg at 07/31/24 9073   mirtazapine  (REMERON ) tablet 15 mg  15 mg Oral QHS Marry Clamp, MD       OLANZapine (ZYPREXA) injection 5 mg  5 mg Intramuscular TID PRN Jacquetta Sharlot GRADE, NP        Lab Results:  No results found for this or any previous visit (from the past 48 hours).  Blood Alcohol level:  Lab Results  Component Value Date   [facility] <15 07/26/2024   ETH <10 12/02/2020    Metabolic Disorder Labs: Lab Results  Component Value Date   HGBA1C 5.2 07/27/2024   MPG 102.54 07/27/2024   MPG 96.8 12/02/2020   Lab Results  Component Value Date   PROLACTIN 40.7 (H) 05/30/2018   PROLACTIN 49.0 (H) 05/27/2018   Lab Results  Component Value Date   CHOL 158 12/02/2020   TRIG 28 12/02/2020   HDL 58 12/02/2020   CHOLHDL 2.7 12/02/2020   VLDL 6 12/02/2020   LDLCALC 94 12/02/2020   LDLCALC 70 05/27/2018    Physical Findings:   Psychiatric Specialty Exam: Physical Exam Constitutional:      Appearance: the patient is not toxic-appearing.  Pulmonary:     Effort: Pulmonary effort is normal.  Neurological:     General: No focal deficit present.     Mental Status: the patient is alert and oriented to person, place, and time.   Review of Systems  Respiratory:  Negative for  shortness of breath.   Cardiovascular:  Negative for chest pain.  Gastrointestinal:  Negative for abdominal pain, constipation, diarrhea, nausea and vomiting.  Neurological:  Negative for headaches.      BP 132/81 (BP Location: Left Arm)   Pulse 91   Temp 99 F (37.2 C) (Oral)   Resp 18   Ht 4' 9 (1.448 m)  Wt 72.3 kg   SpO2 99%   BMI 34.49 kg/m   General Appearance: Fairly Groomed  Eye Contact:  Good  Speech:  Clear and Coherent  Volume:  Normal  Mood:  Euthymic  Affect: Somewhat anxious  Thought Process:  Coherent  Orientation:  Full (Time, Place, and Person)  Thought Content: Logical   Suicidal Thoughts:  No  Homicidal Thoughts:  No  Memory:  Immediate;   Good  Judgement:  fair  Insight:  fair  Psychomotor Activity:  Normal  Concentration:  Concentration: Good  Recall:  Good  Fund of Knowledge: Good  Language: Good  Akathisia:  No  Handed:  not assessed  AIMS (if indicated): not done  Assets:  Communication Skills Desire for Improvement Financial Resources/Insurance Housing Leisure Time Physical Health  ADL's:  Intact  Cognition: WNL  Sleep:  Fair      Treatment Plan Summary: Daily contact with patient to assess and evaluate symptoms and progress in treatment and Medication management  PLAN: Safety and Monitoring:             --  Involuntary admission to inpatient psychiatric unit for safety, stabilization and treatment             -- Daily contact with patient to assess and evaluate symptoms and progress in treatment             -- Patient's case to be discussed in multi-disciplinary team meeting             -- Observation Level : q15 minute checks             -- Vital signs:  q12 hours             -- Precautions: suicide, elopement, and assault   2. Psychiatric Diagnoses and Treatment:  Major depressive disorder, generalized anxiety disorder - Continue Remeron  15 mg nightly, patient reports prior efficacy - Will need referral for psychiatrist  and therapist, [facility]                    3. Medical Issues Being Addressed:  Patient denies current medical issues and denies taking medications prior to admission Labs reviewed, unremarkable Not on QT prolonging medication               Tobacco Use Disorder             -- Nicotine patch 21mg /24 hours ordered             -- Smoking cessation encouraged   4. Discharge Planning:              -- Social work and case management to assist with discharge planning and identification of hospital follow-up needs prior to discharge             -- Estimated LOS: 5-7 days             -- Discharge Concerns: Need to establish a safety plan; Medication compliance and effectiveness             -- Discharge Goals: Return home with outpatient referrals for mental health follow-up including medication management/psychotherapy   Karleen Kaufmann, MD PGY-4

## 2024-07-31 NOTE — [facility] Group Notes (Signed)
 Adult Psychoeducational Group Note  Date:  07/31/2024 Time:  8:57 PM  Group Topic/Focus:  Wrap-Up Group:   The focus of this group is to help patients review their daily goal of treatment and discuss progress on daily workbooks.  Participation Level:  Active  Participation Quality:  Appropriate  Affect:  Appropriate  Cognitive:  Appropriate  Insight: Appropriate  Engagement in Group:  Engaged  Modes of Intervention:  Discussion  Additional Comments:  Hertha day was a  10. Goal was ato write a discharge plan. Coping skills breathing music. Favorite part of the day laughing with everyone . Visiting with mom . Something she like loving heart. What can she do to continue recovery stay on medicine.  Lang Drilling Long 07/31/2024, 8:57 PM

## 2024-08-01 NOTE — Progress Notes (Addendum)
 Patient denies SI, AH, VH. Patient stated they slept Good last night. Scored zero on anxiety and depression. Patient states it's most important To attend all my groups again, and strengthen my arm and states will Workout at gym again, stay positive and awake to help meet their goal. Patient has been calm and cooperative.    Mercie VEAR Banana, RN 10:24 AM    08/01/24 0800  Psych Admission Type (Psych Patients Only)  Admission Status Involuntary  Psychosocial Assessment  Patient Complaints None  Eye Contact Fair  Facial Expression Flat  Affect Flat  Speech Soft  Interaction Assertive;Childlike  Motor Activity Slow  Appearance/Hygiene Unremarkable  Behavior Characteristics Cooperative;Appropriate to situation;Calm  Mood Pleasant  Thought Process  Coherency WDL  Content WDL  Delusions None reported or observed  Perception WDL  Hallucination None reported or observed  Judgment WDL  Confusion None  Danger to Self  Current suicidal ideation? Denies  Self-Injurious Behavior No self-injurious ideation or behavior indicators observed or expressed   Agreement Not to Harm Self Yes  Description of Agreement Verbal  Danger to Others  Danger to Others None reported or observed

## 2024-08-01 NOTE — Group Note (Deleted)
 Date:  08/01/2024 Time:  10:58 AM  Group Topic/Focus:  Developing a Wellness Toolbox:   The focus of this group is to help patients develop a wellness toolbox with skills and strategies to promote recovery upon discharge. Along will doing a spiritual wellness assesment    Participation Level:  {B[facility] PARTICIPATION LEVEL:22264}  Participation Quality:  {B[facility] PARTICIPATION QUALITY:22265}  Affect:  {BHH AFFECT:22266}  Cognitive:  {B[facility] COGNITIVE:22267}  Insight: {BHH Insight2:20797}  Engagement in Group:  {BHH ENGAGEMENT IN HMNLE:77731}  Modes of Intervention:  {BHH MODES OF INTERVENTION:22269}  Additional Comments:  ***  Terrah Decoster M Alen Matheson 08/01/2024, 10:58 AM

## 2024-08-01 NOTE — Plan of Care (Signed)
   Problem: Education: Goal: Knowledge of [facility] Education information/materials will improve Outcome: Progressing Goal: Emotional status will improve Outcome: Progressing Goal: Mental status will improve Outcome: Progressing Goal: Verbalization of understanding the information provided will improve Outcome: Progressing   Problem: Activity: Goal: Interest or engagement in activities will improve Outcome: Progressing

## 2024-08-01 NOTE — [facility] Group Notes (Signed)
 Adult Psychoeducational Group Note  Date:  08/01/2024 Time:  8:36 PM  Group Topic/Focus:  Wrap-Up Group:   The focus of this group is to help patients review their daily goal of treatment and discuss progress on daily workbooks.  Participation Level:  Active  Participation Quality:  Adult Psychoeducational Group Note  Date:  08/01/2024 Time:  8:36 PM  Group Topic/Focus:  Wrap-Up Group:   The focus of this group is to help patients review their daily goal of treatment and discuss progress on daily workbooks.  Participation Level:  Active  Participation Quality:  Appropriate  Affect:  Appropriate  Cognitive:  Appropriate  Insight: Appropriate  Engagement in Group:  Engaged  Modes of Intervention:  Discussion  Additional Comments:  Shaneen said her day was a 10. Her goal for today stregthen her arm. Discharge made. Coping skills breathings praying listen to music. Favorite part of the day laughing with everyione. Something she like love for food . 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

## 2024-08-01 NOTE — Group Note (Signed)
 Date:  08/01/2024 Time:  1:49 PM  Group Topic/Focus: Strength. Chapline Group The focus of this group is to identify what feelings patients have difficulty handling and develop a plan to handle them in a healthier way upon discharge.    Participation Level:  Active  Participation Quality:  Appropriate  Affect:  Appropriate  Cognitive:  Appropriate  Insight: Appropriate  Engagement in Group:  Engaged  Modes of Intervention:  Orientation  Additional Comments:  Patient attend  Suzanne Garcia 08/01/2024, 1:49 PM

## 2024-08-01 NOTE — Group Note (Signed)
 Date:  08/01/2024 Time:  11:59 AM  Group Topic/Focus: Recreational Therapy  Participation Level:  Active  Participation Quality:  Appropriate  Affect:  Appropriate  Cognitive:  Alert and Appropriate  Insight: Appropriate and Good  Engagement in Group:  Engaged and Improving  Modes of Intervention:  Orientation  Additional Comments:  Patient attended and done Recreational Therapy   Suzanne Garcia 08/01/2024, 11:59 AM

## 2024-08-01 NOTE — Plan of Care (Signed)
   Problem: Education: Goal: Emotional status will improve Outcome: Progressing Goal: Mental status will improve Outcome: Progressing   Problem: Activity: Goal: Interest or engagement in activities will improve Outcome: Progressing

## 2024-08-01 NOTE — Progress Notes (Signed)
 Spiritual care group facilitated by Chaplain Rockie Sofia, [facility]  Group focused on topic of strength. Group members reflected on what thoughts and feelings emerge when they hear this topic. They then engaged in facilitated dialog around how strength is present in their lives. This dialog focused on representing what strength had been to them in their lives (images and patterns given) and what they saw as helpful in their life now (what they needed / wanted).  Activity drew on narrative framework.  Patient Progress: Suzanne Garcia attended group.  Though verbal participation was minimal, she demonstrated engagement in the group conversation.

## 2024-08-01 NOTE — Group Note (Deleted)
 Date:  08/01/2024 Time:  10:43 AM  Group Topic/Focus:  Orientation:   The focus of this group is to educate the patient on the purpose and policies of crisis stabilization and provide a format to answer questions about their admission.  The group details unit policies and expectations of patients while admitted. Did a worksheet for spiritual health wellness.    Participation Level:  Active  Participation Quality:  Appropriate  Affect:  Appropriate  Cognitive:  Alert  Insight: Appropriate  Engagement in Group:  Improving  Modes of Intervention:  Orientation  Additional Comments:  Patient attended Group and participated in the assessment.  Colen Eltzroth M Kendalynn Wideman 08/01/2024, 10:43 AM

## 2024-08-01 NOTE — Group Note (Signed)
 Date:  08/01/2024 Time:  11:16 AM  Group Topic/Focus:  Developing a Wellness Toolbox:   The focus of this group is to help patients develop a wellness toolbox with skills and strategies to promote recovery upon discharge. Orientation:   The focus of this group is to educate the patient on the purpose and policies of crisis stabilization and provide a format to answer questions about their admission.  The group details unit policies and expectations of patients while admitted.  Participation Level:  Active  Participation Quality:  Appropriate  Affect:  Appropriate  Cognitive:  Appropriate and Oriented  Insight: Appropriate  Engagement in Group:  Engaged and Improving  Modes of Intervention:  Orientation  Additional Comments:  Patient attended Group and did assessment task with spiritual wellness.   Suzanne Garcia [name] 08/01/2024, 11:16 AM

## 2024-08-01 NOTE — Group Note (Signed)
 Date:  08/01/2024 Time:  4:32 PM  Group Topic/Focus:  Dimensions of Wellness:   The focus of this group is to introduce the topic of wellness and discuss the role each dimension of wellness plays in total health.    Participation Level:  Active  Participation Quality:  Appropriate  Affect:  Appropriate  Cognitive:  Appropriate  Insight: Appropriate  Engagement in Group:  Engaged  Modes of Intervention:  Discussion   Suzanne Garcia  Suzanne Garcia 08/01/2024, 4:32 PM

## 2024-08-01 NOTE — Progress Notes (Signed)
 [facility] MD Progress Note  08/01/2024 3:49 PM Suzanne Garcia  MRN:  [id number]  Principal Problem: Major depressive disorder, recurrent severe without psychotic features (HCC) Diagnosis: Principal Problem:   Major depressive disorder, recurrent severe without psychotic features (HCC) Active Problems:   Intentional acetaminophen  overdose (HCC)   Generalized anxiety disorder with panic attacks    Reason for Admission:  The patient is a 23 year old female who goes by Suzanne Garcia.  She has a past mental health history of 3 previous behavioral health admissions, most recently in 2022 where she presented with suicidal thoughts, primary diagnosis given was MDD.  On the present occasion, the patient took a large overdose of Tylenol  and was found unresponsive with seizure-like activity by her mother.  She had to be intubated and was sent to the ICU.  She is currently admitted to the [facility] on an involuntary basis.    Information obtained from 24-hour nursing report: No remarkable events   Information Obtained Today During Patient Interview:  The patient engages well on interview today.  She appears less anxious and appears more euthymic than the past 2 days.  She says that she is going to groups and feels more comfortable on the unit.  She reports good sleep and improved mood.  She reports appropriate appetite.  She denies side effects from Remeron .  Total Time spent with patient: 20 min  Past Psychiatric History: as per H and P  Past Medical History:  Past Medical History:  Diagnosis Date   Anxiety    Bipolar disorder (HCC)    Depression     Past Surgical History:  Procedure Laterality Date   NO PAST SURGERIES     Family History:  Family History  Problem Relation Age of Onset   Obesity Mother    Asthma Mother    Miscarriages / Stillbirths Mother    Diabetes Mother    Depression Mother    Mental illness Father    ADD / ADHD Sister    Bipolar disorder Sister    Heart  disease Maternal Grandmother    Diabetes Maternal Grandmother    Bipolar disorder Maternal Grandmother    Bipolar disorder Maternal Grandfather    Multiple sclerosis Maternal Grandfather    Family Psychiatric  History: as per H and P Social History:  Social History   Substance and Sexual Activity  Alcohol Use Not Currently     Social History   Substance and Sexual Activity  Drug Use Not Currently    Social History   Socioeconomic History   Marital status: Single    Spouse name: Not on file   Number of children: Not on file   Years of education: Not on file   Highest education level: Not on file  Occupational History   Not on file  Tobacco Use   Smoking status: Never   Smokeless tobacco: Never  Vaping Use   Vaping status: Never Used  Substance and Sexual Activity   Alcohol use: Not Currently   Drug use: Not Currently   Sexual activity: Yes    Partners: Male    Birth control/protection: None  Other Topics Concern   Not on file  Social History Narrative   Born at [facility] in [place]. Jaundice in NBN.      Social Drivers of Corporate Investment Banker Strain: Not on file  Food Insecurity: Food Insecurity Present (07/29/2024)   Hunger Vital Sign    Worried About Running Out of Food in the Last  Year: Sometimes true    Ran Out of Food in the Last Year: Sometimes true  Transportation Needs: No Transportation Needs (07/29/2024)   PRAPARE - Administrator, Civil Service (Medical): No    Lack of Transportation (Non-Medical): No  Physical Activity: Not on file  Stress: Not on file  Social Connections: Unknown (07/29/2024)   Social Connection and Isolation Panel    Frequency of Communication with Friends and Family: More than three times a week    Frequency of Social Gatherings with Friends and Family: More than three times a week    Attends Religious Services: Not on Marketing Executive or Organizations: Yes    Attends Banker  Meetings: 1 to 4 times per year    Marital Status: Never married   Additional Social History:                         Sleep: fair  Appetite: fair  Current Medications: Current Facility-Administered Medications  Medication Dose Route Frequency Provider Last Rate Last Admin   alum & mag hydroxide-simeth (MAALOX/MYLANTA) 200-200-20 MG/5ML suspension 30 mL  30 mL Oral Q4H PRN Jacquetta Sharlot GRADE, NP       docusate sodium (COLACE) capsule 100 mg  100 mg Oral BID PRN Pashayan, Alexander S, DO       hydrOXYzine  (ATARAX ) tablet 25 mg  25 mg Oral TID PRN Bobbitt, Shalon E, NP   25 mg at 07/31/24 0926   mirtazapine  (REMERON ) tablet 15 mg  15 mg Oral QHS Marry Clamp, MD   15 mg at 07/31/24 2100   OLANZapine (ZYPREXA) injection 5 mg  5 mg Intramuscular TID PRN Jacquetta Sharlot GRADE, NP        Lab Results:  No results found for this or any previous visit (from the past 48 hours).  Blood Alcohol level:  Lab Results  Component Value Date   [facility] <15 07/26/2024   ETH <10 12/02/2020    Metabolic Disorder Labs: Lab Results  Component Value Date   HGBA1C 5.2 07/27/2024   MPG 102.54 07/27/2024   MPG 96.8 12/02/2020   Lab Results  Component Value Date   PROLACTIN 40.7 (H) 05/30/2018   PROLACTIN 49.0 (H) 05/27/2018   Lab Results  Component Value Date   CHOL 158 12/02/2020   TRIG 28 12/02/2020   HDL 58 12/02/2020   CHOLHDL 2.7 12/02/2020   VLDL 6 12/02/2020   LDLCALC 94 12/02/2020   LDLCALC 70 05/27/2018    Physical Findings:   Psychiatric Specialty Exam: Physical Exam Constitutional:      Appearance: the patient is not toxic-appearing.  Pulmonary:     Effort: Pulmonary effort is normal.  Neurological:     General: No focal deficit present.     Mental Status: the patient is alert and oriented to person, place, and time.   Review of Systems  Respiratory:  Negative for shortness of breath.   Cardiovascular:  Negative for chest pain.  Gastrointestinal:  Negative for  abdominal pain, constipation, diarrhea, nausea and vomiting.  Neurological:  Negative for headaches.      BP 119/78 (BP Location: Left Arm)   Pulse 86   Temp 98.4 F (36.9 C) (Oral)   Resp 18   Ht 4' 9 (1.448 m)   Wt 72.3 kg   SpO2 100%   BMI 34.49 kg/m   General Appearance: Fairly Groomed  Eye Contact:  Good  Speech:  Clear and Coherent  Volume:  Normal  Mood:  Euthymic  Affect: Somewhat anxious  Thought Process:  Coherent  Orientation:  Full (Time, Place, and Person)  Thought Content: Logical   Suicidal Thoughts:  No  Homicidal Thoughts:  No  Memory:  Immediate;   Good  Judgement:  fair  Insight:  fair  Psychomotor Activity:  Normal  Concentration:  Concentration: Good  Recall:  Good  Fund of Knowledge: Good  Language: Good  Akathisia:  No  Handed:  not assessed  AIMS (if indicated): not done  Assets:  Communication Skills Desire for Improvement Financial Resources/Insurance Housing Leisure Time Physical Health  ADL's:  Intact  Cognition: WNL  Sleep:  Fair      Treatment Plan Summary: Daily contact with patient to assess and evaluate symptoms and progress in treatment and Medication management  PLAN: Safety and Monitoring:             --  Involuntary admission to inpatient psychiatric unit for safety, stabilization and treatment             -- Daily contact with patient to assess and evaluate symptoms and progress in treatment             -- Patient's case to be discussed in multi-disciplinary team meeting             -- Observation Level : q15 minute checks             -- Vital signs:  q12 hours             -- Precautions: suicide, elopement, and assault   2. Psychiatric Diagnoses and Treatment:  Major depressive disorder, generalized anxiety disorder - Continue Remeron  15 mg nightly, patient reports prior efficacy - Will need referral for psychiatrist and therapist, [facility]                    3. Medical Issues Being Addressed:  Patient denies  current medical issues and denies taking medications prior to admission Labs reviewed, unremarkable Not on QT prolonging medication               Tobacco Use Disorder             -- Nicotine patch 21mg /24 hours ordered             -- Smoking cessation encouraged   4. Discharge Planning:              -- Social work and case management to assist with discharge planning and identification of hospital follow-up needs prior to discharge             -- Estimated LOS: 5-7 days             -- Discharge Concerns: Need to establish a safety plan; Medication compliance and effectiveness             -- Discharge Goals: Return home with outpatient referrals for mental health follow-up including medication management/psychotherapy   Karleen Kaufmann, MD PGY-4

## 2024-08-01 NOTE — [facility] Suicide Risk Assessment (Signed)
 [facility] INPATIENT:  Family/Significant Other Suicide Prevention Education  Suicide Prevention Education:  Education Completed;  Suzanne Garcia (mom) ,  (name of family member/significant other) has been identified by the patient as the family member/significant other with whom the patient will be residing, and identified as the person(s) who will aid the patient in the event of a mental health crisis (suicidal ideations/suicide attempt).  With written consent from the patient, the family member/significant other has been provided the following suicide prevention education, prior to the and/or following the discharge of the patient.  The suicide prevention education provided includes the following: Suicide risk factors Suicide prevention and interventions National Suicide Hotline telephone number [facility] assessment telephone number [facility] Emergency Assistance 911 [facility] and/or Residential Mobile Crisis Unit telephone number  Request made of family/significant other to: Remove weapons (e.g., guns, rifles, knives), all items previously/currently identified as safety concern.   Remove drugs/medications (over-the-counter, prescriptions, illicit drugs), all items previously/currently identified as a safety concern.  The family member/significant other verbalizes understanding of the suicide prevention education information provided.  The family member/significant other agrees to remove the items of safety concern listed above.  Suzanne Garcia 08/01/2024, 2:01 PM

## 2024-08-01 NOTE — Group Note (Signed)
 Recreation Therapy Group Note   Group Topic:Problem Solving  Group Date: 08/01/2024 Start Time: 0945 End Time: 1005 Facilitators: Marco Raper-McCall, LRT,CTRS Location: [address] Dayroom   Group Topic: Problem Solving  Goal Area(s) Addresses:  Patient will effectively work in a team with other group members. Patient will verbalize importance of using appropriate problem solving techniques.  Patient will identify positive change associated with effective problem solving skills.   Behavioral Response: Engaged  Intervention: Worksheet  Activity: Dentist. Patients were given a sheet front and back of brain teasers. Patients worked together (or alone) to figure out each puzzle presented.     Education: Problem solving as an important skill in daily life  Education Outcome: Acknowledges understanding/In group clarification offered/Needs additional education.    Affect/Mood: Appropriate   Participation Level: Engaged   Participation Quality: Independent   Behavior: Appropriate   Speech/Thought Process: Focused   Insight: Good   Judgement: Good   Modes of Intervention: Group work   Patient Response to Interventions:  Engaged   Education Outcome:  In group clarification offered    Clinical Observations/Individualized Feedback: Pt was bright and engaged throughout group. Pt worked well with peers in coming up with the answers to the teasers. Pt was appropriate and attentive as well.    Plan: Continue to engage patient in RT group sessions 2-3x/week.   Ezell Poke-McCall, LRT,CTRS 08/01/2024 1:27 PM

## 2024-08-01 NOTE — Progress Notes (Signed)
(  Sleep Hours) - 8  (Any PRNs that were needed, meds refused, or side effects to meds)- atarax  25 mg, no meds refused, no side effects to meds  (Any disturbances and when (visitation, over night)- n/a  (Concerns raised by the patient)- n/a  (SI/HI/AVH)- denies

## 2024-08-02 NOTE — Group Note (Signed)
 LCSW Group Therapy Note   Group Date: 08/02/2024 Start Time: 1100 End Time: 1200   Participation:  patient was present.  She listened and was respectful but didn't participate in the discussion.  Type of Therapy:  Group Therapy  Topic:  Stress Less:  Nurturing Your Mind and Body Through Calm   Objective:  Learn techniques for managing stress through body relaxation, mindfulness, and self-compassion.  Goals: Use body relaxation techniques, such as Box Breathing and Progressive Muscle Relaxation, to reduce physical tension. Practice mindfulness to break the cycle of overthinking and mental chatter. Embrace self-compassion to handle stress with kindness and resilience.  Summary:  Today's session focused on calming the body with relaxation techniques, breaking the cycle of stress with mindfulness, and using self-compassion to manage challenges more gracefully. These tools help reduce stress and foster a balanced, peaceful mindset.  Therapeutic Modalities used:  Elements of CBT ( cognitive restructuring)  Elements of DBT (box breathing, progressive body relaxation, mindfulness, acceptance)    Mathius Birkeland O Ashlley Booher, LCSWA 08/02/2024  12:26 PM

## 2024-08-02 NOTE — Group Note (Signed)
 Date:  08/02/2024 Time:  9:29 AM  Group Topic/Focus:  Goals Group:   The focus of this group is to help patients establish daily goals to achieve during treatment and discuss how the patient can incorporate goal setting into their daily lives to aide in recovery.    Participation Level:  Active  Participation Quality:  Attentive  Affect:  Appropriate  Cognitive:  Appropriate  Insight: Appropriate  Engagement in Group:  Engaged  Modes of Intervention:  Discussion  Additional Comments:  N/A  Bakary Bramer A Sarahi Borland 08/02/2024, 9:29 AM

## 2024-08-02 NOTE — [facility] Suicide Risk Assessment (Signed)
  [facility] Discharge Suicide Risk Assessment   Principal Problem: Major depressive disorder, recurrent severe without psychotic features (HCC) Discharge Diagnoses: Principal Problem:   Major depressive disorder, recurrent severe without psychotic features (HCC) Active Problems:   Intentional acetaminophen  overdose (HCC)   Generalized anxiety disorder with panic attacks   Demographic Factors:  NA  Loss Factors: NA  Historical Factors: NA  Risk Reduction Factors:   Positive social support Coping skills Good therapeutic relationship  Continued Clinical Symptoms:  Minimal symptoms at present, greatly improved from admission  Cognitive Features That Contribute To Risk:  None  Suicide Risk:  Mild: Suicidal ideation of limited frequency, intensity, duration, and specificity.  There are no identifiable plans, no associated intent, mild dysphoria and related symptoms, good self-control (both objective and subjective assessment), few other risk factors, and identifiable protective factors, including available and accessible social support.   Follow-up Information     [facility]. Go on 08/13/2024.   Specialty: Behavioral Health Why: You have an appointment for medication management services on 08/13/24 at 2:00 pm, in person.  You also have an appointment for therapy services on 10/30/23 at 11:00 am with Paige, in person. Contact information: [address] [place]  [phone]        [facility]. Go to.   Specialty: Professional Counselor Why: You may go to this provider Monday through Friday, from 9 am to 1 pm for an assessment, to obtain therapy services. Contact information: [address] [place]  [address] [place] [address] [phone]                 Karleen Kaufmann, MD PGY-4

## 2024-08-02 NOTE — Group Note (Signed)
 Date:  08/02/2024 Time:  1:48 PM  Group Topic/Focus:  Emotional Education:   The focus of this group is to discuss what feelings/emotions are, and how they are experienced.    Participation Level:  Active  Participation Quality:  Appropriate  Affect:  Appropriate  Cognitive:  Appropriate  Insight: Appropriate  Engagement in Group:  Engaged  Modes of Intervention:  Discussion  Additional Comments:  N/A  Jordayn Mink A Kyng Matlock 08/02/2024, 1:48 PM

## 2024-08-02 NOTE — Progress Notes (Signed)
 [facility] MD Progress Note  08/02/2024 10:26 AM Suzanne Garcia  MRN:  [id number]  Principal Problem: Major depressive disorder, recurrent severe without psychotic features (HCC) Diagnosis: Principal Problem:   Major depressive disorder, recurrent severe without psychotic features (HCC) Active Problems:   Intentional acetaminophen  overdose (HCC)   Generalized anxiety disorder with panic attacks    Reason for Admission:  The patient is a 23 year old female who goes by Suzanne Garcia.  She has a past mental health history of 3 previous behavioral health admissions, most recently in 2022 where she presented with suicidal thoughts, primary diagnosis given was MDD.  On the present occasion, the patient took a large overdose of Tylenol  and was found unresponsive with seizure-like activity by her mother.  She had to be intubated and was sent to the ICU.  She is currently admitted to the [facility] on an involuntary basis.    Information obtained from 24-hour nursing report: No remarkable events   Information Obtained Today During Patient Interview:  Patient presents as euthymic with minimal to no anxiety.  She spontaneously comments that she feels her anxiety has reduced dramatically over the course of the hospitalization.  When prompted to think about plans for the future, she says she asked her mother to block her ex-boyfriend's number on her phone, which appears to be a positive step.  She reports doing well with the Remeron  at nighttime.  She reports good sleep and appetite.  Denies thoughts of self-harm or suicide.  Total Time spent with patient: 20 min  Past Psychiatric History: as per H and P  Past Medical History:  Past Medical History:  Diagnosis Date   Anxiety    Bipolar disorder (HCC)    Depression     Past Surgical History:  Procedure Laterality Date   NO PAST SURGERIES     Family History:  Family History  Problem Relation Age of Onset   Obesity Mother    Asthma Mother     Miscarriages / Stillbirths Mother    Diabetes Mother    Depression Mother    Mental illness Father    ADD / ADHD Sister    Bipolar disorder Sister    Heart disease Maternal Grandmother    Diabetes Maternal Grandmother    Bipolar disorder Maternal Grandmother    Bipolar disorder Maternal Grandfather    Multiple sclerosis Maternal Grandfather    Family Psychiatric  History: as per H and P Social History:  Social History   Substance and Sexual Activity  Alcohol Use Not Currently     Social History   Substance and Sexual Activity  Drug Use Not Currently    Social History   Socioeconomic History   Marital status: Single    Spouse name: Not on file   Number of children: Not on file   Years of education: Not on file   Highest education level: Not on file  Occupational History   Not on file  Tobacco Use   Smoking status: Never   Smokeless tobacco: Never  Vaping Use   Vaping status: Never Used  Substance and Sexual Activity   Alcohol use: Not Currently   Drug use: Not Currently   Sexual activity: Yes    Partners: Male    Birth control/protection: None  Other Topics Concern   Not on file  Social History Narrative   Born at [facility] in [place]. Jaundice in NBN.      Social Drivers of Corporate Investment Banker Strain: Not on  file  Food Insecurity: Food Insecurity Present (07/29/2024)   Hunger Vital Sign    Worried About Running Out of Food in the Last Year: Sometimes true    Ran Out of Food in the Last Year: Sometimes true  Transportation Needs: No Transportation Needs (07/29/2024)   PRAPARE - Administrator, Civil Service (Medical): No    Lack of Transportation (Non-Medical): No  Physical Activity: Not on file  Stress: Not on file  Social Connections: Unknown (07/29/2024)   Social Connection and Isolation Panel    Frequency of Communication with Friends and Family: More than three times a week    Frequency of Social Gatherings with Friends and  Family: More than three times a week    Attends Religious Services: Not on Marketing Executive or Organizations: Yes    Attends Banker Meetings: 1 to 4 times per year    Marital Status: Never married   Additional Social History:                         Sleep: fair  Appetite: fair  Current Medications: Current Facility-Administered Medications  Medication Dose Route Frequency Provider Last Rate Last Admin   alum & mag hydroxide-simeth (MAALOX/MYLANTA) 200-200-20 MG/5ML suspension 30 mL  30 mL Oral Q4H PRN Jacquetta Sharlot GRADE, NP       docusate sodium (COLACE) capsule 100 mg  100 mg Oral BID PRN Pashayan, Alexander S, DO       hydrOXYzine  (ATARAX ) tablet 25 mg  25 mg Oral TID PRN Bobbitt, Shalon E, NP   25 mg at 07/31/24 0926   mirtazapine  (REMERON ) tablet 15 mg  15 mg Oral QHS Marry Clamp, MD   15 mg at 08/01/24 2059   OLANZapine (ZYPREXA) injection 5 mg  5 mg Intramuscular TID PRN Jacquetta Sharlot GRADE, NP        Lab Results:  No results found for this or any previous visit (from the past 48 hours).  Blood Alcohol level:  Lab Results  Component Value Date   [facility] <15 07/26/2024   ETH <10 12/02/2020    Metabolic Disorder Labs: Lab Results  Component Value Date   HGBA1C 5.2 07/27/2024   MPG 102.54 07/27/2024   MPG 96.8 12/02/2020   Lab Results  Component Value Date   PROLACTIN 40.7 (H) 05/30/2018   PROLACTIN 49.0 (H) 05/27/2018   Lab Results  Component Value Date   CHOL 158 12/02/2020   TRIG 28 12/02/2020   HDL 58 12/02/2020   CHOLHDL 2.7 12/02/2020   VLDL 6 12/02/2020   LDLCALC 94 12/02/2020   LDLCALC 70 05/27/2018    Physical Findings:   Psychiatric Specialty Exam: Physical Exam Constitutional:      Appearance: the patient is not toxic-appearing.  Pulmonary:     Effort: Pulmonary effort is normal.  Neurological:     General: No focal deficit present.     Mental Status: the patient is alert and oriented to person, place, and  time.   Review of Systems  Respiratory:  Negative for shortness of breath.   Cardiovascular:  Negative for chest pain.  Gastrointestinal:  Negative for abdominal pain, constipation, diarrhea, nausea and vomiting.  Neurological:  Negative for headaches.      BP 115/81 (BP Location: Left Arm)   Pulse 86   Temp 98.5 F (36.9 C) (Oral)   Resp 18   Ht 4' 9 (1.448 m)  Wt 72.3 kg   SpO2 100%   BMI 34.49 kg/m   General Appearance: Fairly Groomed  Eye Contact:  Good  Speech:  Clear and Coherent  Volume:  Normal  Mood:  Euthymic  Affect: Somewhat anxious  Thought Process:  Coherent  Orientation:  Full (Time, Place, and Person)  Thought Content: Logical   Suicidal Thoughts:  No  Homicidal Thoughts:  No  Memory:  Immediate;   Good  Judgement:  fair  Insight:  fair  Psychomotor Activity:  Normal  Concentration:  Concentration: Good  Recall:  Good  Fund of Knowledge: Good  Language: Good  Akathisia:  No  Handed:  not assessed  AIMS (if indicated): not done  Assets:  Communication Skills Desire for Improvement Financial Resources/Insurance Housing Leisure Time Physical Health  ADL's:  Intact  Cognition: WNL  Sleep:  Fair      Treatment Plan Summary: Daily contact with patient to assess and evaluate symptoms and progress in treatment and Medication management  PLAN: Safety and Monitoring:             --  Involuntary admission to inpatient psychiatric unit for safety, stabilization and treatment             -- Daily contact with patient to assess and evaluate symptoms and progress in treatment             -- Patient's case to be discussed in multi-disciplinary team meeting             -- Observation Level : q15 minute checks             -- Vital signs:  q12 hours             -- Precautions: suicide, elopement, and assault   2. Psychiatric Diagnoses and Treatment:  Major depressive disorder, generalized anxiety disorder - Continue Remeron  15 mg nightly, patient  reports prior efficacy - Will need referral for psychiatrist and therapist, [facility]                    3. Medical Issues Being Addressed:  Patient denies current medical issues and denies taking medications prior to admission Labs reviewed, unremarkable Not on QT prolonging medication               Tobacco Use Disorder             -- Nicotine patch 21mg /24 hours ordered             -- Smoking cessation encouraged   4. Discharge Planning:              -- Social work and case management to assist with discharge planning and identification of hospital follow-up needs prior to discharge             -- Estimated LOS: 5-7 days             -- Discharge Concerns: Need to establish a safety plan; Medication compliance and effectiveness             -- Discharge Goals: Return home with outpatient referrals for mental health follow-up including medication management/psychotherapy   Karleen Kaufmann, MD PGY-4

## 2024-08-02 NOTE — Group Note (Signed)
 Occupational Therapy Group Note  Group Topic: Sleep Hygiene  Group Date: 08/02/2024 Start Time: 1500 End Time: 1530 Facilitators: Dot Dallas MATSU, OT   Group Description: Group encouraged increased participation and engagement through topic focused on sleep hygiene. Patients reflected on the quality of sleep they typically receive and identified areas that need improvement. Group was given background information on sleep and sleep hygiene, including common sleep disorders. Group members also received information on how to improve one's sleep and introduced a sleep diary as a tool that can be utilized to track sleep quality over a length of time. Group session ended with patients identifying one or more strategies they could utilize or implement into their sleep routine in order to improve overall sleep quality.        Therapeutic Goal(s):  Identify one or more strategies to improve overall sleep hygiene  Identify one or more areas of sleep that are negatively impacted (sleep too much, too little, etc)     Participation Level: Engaged   Participation Quality: Independent   Behavior: Appropriate   Speech/Thought Process: Relevant   Affect/Mood: Appropriate   Insight: Fair   Judgement: Fair      Modes of Intervention: Education  Patient Response to Interventions:  Attentive   Plan: Continue to engage patient in OT groups 2 - 3x/week.  08/02/2024  D[place] MATSU Dot, OT   Ziaire Bieser, OT

## 2024-08-02 NOTE — Progress Notes (Signed)
   08/02/24 0900  Psych Admission Type (Psych Patients Only)  Admission Status Involuntary  Psychosocial Assessment  Patient Complaints None  Eye Contact Fair  Facial Expression Flat  Affect Flat  Speech Logical/coherent  Interaction Assertive  Motor Activity Other (Comment)  Appearance/Hygiene Unremarkable  Behavior Characteristics Appropriate to situation  Mood Depressed  Thought Process  Coherency WDL  Content WDL  Delusions None reported or observed  Perception WDL  Hallucination None reported or observed  Judgment WDL  Confusion None  Danger to Self  Current suicidal ideation? Denies  Danger to Others  Danger to Others None reported or observed

## 2024-08-02 NOTE — Discharge Summary (Signed)
 Physician Discharge Summary Note  Patient:  Suzanne Garcia is an 23 y.o., female MRN:  [id number] DOB:  2001/03/06 Patient phone:  [phone] (home)  Patient address:   [address] [place] [address],  Total Time spent with patient: 15 min  Date of Admission:  07/29/2024 Date of Discharge: 08/03/2024  Reason for Admission:   The patient is a 23 year old female who goes by Suzanne Garcia.  She has a past mental health history of 3 previous behavioral health admissions, most recently in 2022 where she presented with suicidal thoughts, primary diagnosis given was MDD.  On the present occasion, the patient took a large overdose of Tylenol  and was found unresponsive with seizure-like activity by her mother.  She had to be intubated and was sent to the ICU.  She is currently admitted to the [facility] on an involuntary basis.   Principal Problem: Major depressive disorder, recurrent severe without psychotic features (HCC) Discharge Diagnoses: Principal Problem:   Major depressive disorder, recurrent severe without psychotic features (HCC) Active Problems:   Intentional acetaminophen  overdose (HCC)   Generalized anxiety disorder with panic attacks    Past Psychiatric History: See above  Past Medical History:  Past Medical History:  Diagnosis Date   Anxiety    Bipolar disorder (HCC)    Depression     Past Surgical History:  Procedure Laterality Date   NO PAST SURGERIES     Family History:  Family History  Problem Relation Age of Onset   Obesity Mother    Asthma Mother    Miscarriages / Stillbirths Mother    Diabetes Mother    Depression Mother    Mental illness Father    ADD / ADHD Sister    Bipolar disorder Sister    Heart disease Maternal Grandmother    Diabetes Maternal Grandmother    Bipolar disorder Maternal Grandmother    Bipolar disorder Maternal Grandfather    Multiple sclerosis Maternal Grandfather    Family Psychiatric  History: See H  and P Social History:  Social History   Substance and Sexual Activity  Alcohol Use Not Currently     Social History   Substance and Sexual Activity  Drug Use Not Currently    Social History   Socioeconomic History   Marital status: Single    Spouse name: Not on file   Number of children: Not on file   Years of education: Not on file   Highest education level: Not on file  Occupational History   Not on file  Tobacco Use   Smoking status: Never   Smokeless tobacco: Never  Vaping Use   Vaping status: Never Used  Substance and Sexual Activity   Alcohol use: Not Currently   Drug use: Not Currently   Sexual activity: Yes    Partners: Male    Birth control/protection: None  Other Topics Concern   Not on file  Social History Narrative   Born at [facility] in [place]. Jaundice in NBN.      Social Drivers of Corporate Investment Banker Strain: Not on file  Food Insecurity: Food Insecurity Present (07/29/2024)   Hunger Vital Sign    Worried About Running Out of Food in the Last Year: Sometimes true    Ran Out of Food in the Last Year: Sometimes true  Transportation Needs: No Transportation Needs (07/29/2024)   PRAPARE - Administrator, Civil Service (Medical): No    Lack of Transportation (Non-Medical): No  Physical Activity: Not on file  Stress: Not on file  Social Connections: Unknown (07/29/2024)   Social Connection and Isolation Panel    Frequency of Communication with Friends and Family: More than three times a week    Frequency of Social Gatherings with Friends and Family: More than three times a week    Attends Religious Services: Not on Marketing Executive or Organizations: Yes    Attends Banker Meetings: 1 to 4 times per year    Marital Status: Never married    Hospital Course:   During the patient's hospitalization, patient had extensive initial psychiatric evaluation, and follow-up psychiatric evaluations every day.    Upon evaluation, psychiatric diagnoses were given as follows:  Major depressive disorder Generalized anxiety disorder with panic attacks   Patient's psychiatric medications were adjusted on admission:  Start Remeron  15 mg nightly   During the hospitalization, other adjustments were made to the patient's psychiatric medication regimen:  No changes   Gradually, patient started adjusting to milieu.   Patient's care was discussed during the interdisciplinary team meeting every day during the hospitalization.   The patient denied having side effects to prescribed psychiatric medication.   The patient reports their target psychiatric symptoms responded well to the psychiatric medications, and the patient reports overall benefit other psychiatric hospitalization. Supportive psychotherapy was provided to the patient. The patient also participated in regular group therapy while admitted.    Labs were reviewed with the patient, and abnormal results were discussed with the patient.   The patient denied having suicidal thoughts more than 48 hours prior to discharge.  Patient denies having homicidal thoughts.  Patient denies having auditory hallucinations.  Patient denies any visual hallucinations.  Patient denies having paranoid thoughts.   The patient is able to verbalize their individual safety plan to this provider.   It is recommended to the patient to continue psychiatric medications as prescribed, after discharge from the hospital.     It is recommended to the patient to follow up with your outpatient psychiatric provider and PCP.    Disposition: to home/self care   Physical Findings: AIMS: 0  Psychiatric Specialty Exam: Physical Exam Constitutional:      Appearance: the patient is not toxic-appearing.  Pulmonary:     Effort: Pulmonary effort is normal.  Neurological:     General: No focal deficit present.     Mental Status: the patient is alert and oriented to person, place,  and time.   Review of Systems  Respiratory:  Negative for shortness of breath.   Cardiovascular:  Negative for chest pain.  Gastrointestinal:  Negative for abdominal pain, constipation, diarrhea, nausea and vomiting.  Neurological:  Negative for headaches.      BP 115/81 (BP Location: Left Arm)   Pulse 86   Temp 98.5 F (36.9 C) (Oral)   Resp 18   Ht 4' 9 (1.448 m)   Wt 72.3 kg   SpO2 100%   BMI 34.49 kg/m   General Appearance: Fairly Groomed  Eye Contact:  Good  Speech:  Clear and Coherent  Volume:  Normal  Mood:  Euthymic  Affect:  Congruent  Thought Process:  Coherent  Orientation:  Full (Time, Place, and Person)  Thought Content: Logical   Suicidal Thoughts:  No  Homicidal Thoughts:  No  Memory:  Immediate;   Good  Judgement:  fair  Insight:  fair  Psychomotor Activity:  Normal  Concentration:  Concentration: Good  Recall:  Good  Fund of Knowledge: Good  Language: Good  Akathisia:  No  Handed:  not assessed  AIMS (if indicated): not done  Assets:  Communication Skills Desire for Improvement Financial Resources/Insurance Housing Leisure Time Physical Health  ADL's:  Intact  Cognition: WNL  Sleep:  Fair      Social History   Tobacco Use  Smoking Status Never  Smokeless Tobacco Never   Tobacco Cessation:  N/A, patient does not currently use tobacco products   Blood Alcohol level:  Lab Results  Component Value Date   [facility] <15 07/26/2024   ETH <10 12/02/2020    Metabolic Disorder Labs:  Lab Results  Component Value Date   HGBA1C 5.2 07/27/2024   MPG 102.54 07/27/2024   MPG 96.8 12/02/2020   Lab Results  Component Value Date   PROLACTIN 40.7 (H) 05/30/2018   PROLACTIN 49.0 (H) 05/27/2018   Lab Results  Component Value Date   CHOL 158 12/02/2020   TRIG 28 12/02/2020   HDL 58 12/02/2020   CHOLHDL 2.7 12/02/2020   VLDL 6 12/02/2020   LDLCALC 94 12/02/2020   LDLCALC 70 05/27/2018    See Psychiatric Specialty Exam and Suicide Risk  Assessment completed by Attending Physician prior to discharge.  Discharge destination: self-care  Is patient on multiple antipsychotic therapies at discharge:  no Has Patient had three or more failed trials of antipsychotic monotherapy by history:  no  Recommended Plan for Multiple Antipsychotic Therapies: NA   Allergies as of 08/02/2024       Reactions   Ibuprofen     Patient states that it causes stomach thinning      Med Rec must be completed prior to using this [facility]       Follow-up Information     [facility]. Go on 08/13/2024.   Specialty: Behavioral Health Why: You have an appointment for medication management services on 08/13/24 at 2:00 pm, in person.  You also have an appointment for therapy services on 10/30/23 at 11:00 am with Paige, in person. Contact information: [address] [place]  [phone]        [facility] Go to.   Specialty: Professional Counselor Why: You may go to this provider Monday through Friday, from 9 am to 1 pm for an assessment, to obtain therapy services. Contact information: [address] [place]  [address] [place] [address] [phone]                 Follow-up recommendations:  Activity as tolerated. Diet as recommended by PCP. Keep all scheduled follow-up appointments as recommended.  Patient is instructed to take all prescribed medications as recommended. Report any side effects or adverse reactions to your outpatient psychiatrist. Patient is instructed to abstain from alcohol and illegal drugs while on prescription medications. In the event of worsening symptoms, patient is instructed to call the crisis hotline, 911, or go to the nearest emergency department for evaluation and treatment.  Prescriptions given at discharge. Patient agreeable to plan. Given opportunity to ask questions. Appears to feel comfortable with discharge.  Patient  is also instructed prior to discharge to: Take all medications as prescribed by mental healthcare provider. Report any adverse effects and or reactions from the medicines to outpatient provider promptly. Patient has been instructed & cautioned: To not engage in alcohol and or illegal drug use while on prescription medicines. In the event of worsening symptoms,  patient is instructed to call the crisis hotline,  911 and or go to the nearest ED for appropriate evaluation and treatment of symptoms. To follow-up with primary care provider for other medical issues, concerns and or health care needs  The patient was evaluated each day by a clinical provider to ascertain response to treatment. Improvement was noted by the patient's report of decreasing symptoms, improved sleep and appetite, affect, medication tolerance, behavior, and participation in unit programming.  Patient was asked each day to complete a self inventory noting mood, mental status, pain, new symptoms, anxiety and concerns.  Patient responded well to medication and being in a therapeutic and supportive environment. Positive and appropriate behavior was noted and the patient was motivated for recovery. The patient worked closely with the treatment team and case manager to develop a discharge plan with appropriate goals. Coping skills, problem solving as well as relaxation therapies were also part of the unit programming.  By the day of discharge patient was in much improved condition than upon admission.  Symptoms were reported as significantly decreased or resolved completely. The patient was motivated to continue taking medication with a goal of continued improvement in mental health.    Comments:  As above  Signed: Karleen Kaufmann, MD PGY-4

## 2024-08-02 NOTE — Progress Notes (Signed)
 Psychoeducational Group Note  Date:  08/02/2024 Time:  2225  Group Topic/Focus:  Wrap-Up Group:   The focus of this group is to help patients review their daily goal of treatment and discuss progress on daily workbooks.  Participation Level: Did Not Attend  Participation Quality:  Not Applicable  Affect:  Not Applicable  Cognitive:  Not Applicable  Insight:  Not Applicable  Engagement in Group: Not Applicable  Additional Comments:  The patient did not attend group this evening.   Koltin Wehmeyer S 08/02/2024, 10:25 PM

## 2024-08-03 DIAGNOSIS — F332 Major depressive disorder, recurrent severe without psychotic features: Principal | ICD-10-CM

## 2024-08-03 DIAGNOSIS — F410 Panic disorder [episodic paroxysmal anxiety] without agoraphobia: Secondary | ICD-10-CM

## 2024-08-03 DIAGNOSIS — F411 Generalized anxiety disorder: Secondary | ICD-10-CM

## 2024-08-03 DIAGNOSIS — T391X2A Poisoning by 4-Aminophenol derivatives, intentional self-harm, initial encounter: Secondary | ICD-10-CM

## 2024-08-03 MED ORDER — MIRTAZAPINE 15 MG PO TABS: 15.0000 mg | ORAL_TABLET | Freq: Every day | ORAL | 0 refills | Status: AC

## 2024-08-03 NOTE — Progress Notes (Signed)
  [facility] Adult Case Management Discharge Plan :  Will you be returning to the same living situation after discharge:  Yes,  pt returning home at discharge At discharge, do you have transportation home?: Yes,  pt's mother will pick pt up this morning around 10AM Do you have the ability to pay for your medications: No. Samples requested from pharmacy at discharge  Release of information consent forms completed and in the chart;  Patient's signature needed at discharge.  Patient to Follow up at:  Follow-up Information     [facility]. Go on 08/13/2024.   Specialty: Behavioral Health Why: You have an appointment for medication management services on 08/13/24 at 2:00 pm, in person.  You also have an appointment for therapy services on 10/30/23 at 11:00 am with Paige, in person. Contact information: [address] [place]  [address] [phone]        [facility] Go to.   Specialty: Professional Counselor Why: You may go to this provider Monday through Friday, from 9 am to 1 pm for an assessment, to obtain therapy services. Contact information: [address] [place]  [address] [place] [address] [phone]                 Next level of care provider has access to [facility]:no  Safety Planning and Suicide Prevention discussed: Yes,   Lakeisha Gilcrest (mom)     Has patient been referred to the Quitline?: Patient does not use tobacco/nicotine products  Patient has been referred for addiction treatment: No known substance use disorder.  Jenkins LULLA Primer, LCSWA 08/03/2024, 9:07 AM

## 2024-08-03 NOTE — Progress Notes (Signed)
 Pt discharged to lobby. Pt was stable and appreciative at that time. All papers and prescriptions were given and valuables returned. Verbal understanding expressed. Denies SI/HI and A/VH. Pt given opportunity to express concerns and ask questions.

## 2024-08-13 ENCOUNTER — Ambulatory Visit (HOSPITAL_COMMUNITY): Admitting: Psychiatry

## 2024-10-08 ENCOUNTER — Encounter: Payer: Self-pay | Admitting: Obstetrics

## 2024-10-25 ENCOUNTER — Other Ambulatory Visit (HOSPITAL_COMMUNITY)
Admission: RE | Admit: 2024-10-25 | Discharge: 2024-10-25 | Disposition: A | Payer: Self-pay | Source: Ambulatory Visit | Attending: Obstetrics and Gynecology | Admitting: Obstetrics and Gynecology

## 2024-10-25 ENCOUNTER — Ambulatory Visit (INDEPENDENT_AMBULATORY_CARE_PROVIDER_SITE_OTHER): Payer: Self-pay | Admitting: Obstetrics and Gynecology

## 2024-10-25 ENCOUNTER — Encounter: Payer: Self-pay | Admitting: Obstetrics and Gynecology

## 2024-10-25 VITALS — BP 122/78 | HR 69 | Ht <= 58 in | Wt 161.0 lb

## 2024-10-25 DIAGNOSIS — Z3202 Encounter for pregnancy test, result negative: Secondary | ICD-10-CM

## 2024-10-25 DIAGNOSIS — Z113 Encounter for screening for infections with a predominantly sexual mode of transmission: Secondary | ICD-10-CM

## 2024-10-25 DIAGNOSIS — Z124 Encounter for screening for malignant neoplasm of cervix: Secondary | ICD-10-CM

## 2024-10-25 DIAGNOSIS — Z30019 Encounter for initial prescription of contraceptives, unspecified: Secondary | ICD-10-CM

## 2024-10-25 DIAGNOSIS — Z01419 Encounter for gynecological examination (general) (routine) without abnormal findings: Secondary | ICD-10-CM

## 2024-10-25 LAB — CERVICOVAGINAL ANCILLARY ONLY
Chlamydia: NEGATIVE
Comment: NEGATIVE
Comment: NORMAL
Neisseria Gonorrhea: NEGATIVE

## 2024-10-25 LAB — POCT URINE PREGNANCY: Preg Test, Ur: NEGATIVE

## 2024-10-25 MED ORDER — MEDROXYPROGESTERONE ACETATE 150 MG/ML IM SUSP
150.0000 mg | Freq: Once | INTRAMUSCULAR | Status: AC
  Administered 2024-10-25: 150 mg via INTRAMUSCULAR

## 2024-10-25 MED ORDER — MEDROXYPROGESTERONE ACETATE 150 MG/ML IM SUSP: 150.0000 mg | INTRAMUSCULAR | 4 refills | Status: AC
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
## 2024-10-26 LAB — HIV ANTIBODY (ROUTINE TESTING W REFLEX): HIV Screen 4th Generation wRfx: NONREACTIVE

## 2024-10-26 LAB — SYPHILIS: RPR W/REFLEX TO RPR TITER AND TREPONEMAL ANTIBODIES, TRADITIONAL SCREENING AND DIAGNOSIS ALGORITHM: RPR Ser Ql: NONREACTIVE

## 2024-10-26 LAB — HEPATITIS B SURFACE ANTIGEN: Hepatitis B Surface Ag: NEGATIVE

## 2024-10-26 LAB — HEPATITIS C ANTIBODY: Hep C Virus Ab: NONREACTIVE

## 2024-10-29 ENCOUNTER — Ambulatory Visit (HOSPITAL_COMMUNITY): Admitting: Clinical

## 2024-10-31 ENCOUNTER — Other Ambulatory Visit: Payer: Self-pay | Admitting: *Deleted

## 2024-10-31 DIAGNOSIS — B3731 Acute candidiasis of vulva and vagina: Secondary | ICD-10-CM

## 2024-10-31 LAB — CYTOLOGY - PAP: Diagnosis: NEGATIVE

## 2024-10-31 MED ORDER — FLUCONAZOLE 150 MG PO TABS: 150.0000 mg | ORAL_TABLET | Freq: Once | ORAL | 0 refills | Status: AC

## 2024-10-31 NOTE — Progress Notes (Signed)
 TC from [facility] indicating pt's Pap result would be updated to include the presence of a fungal organism/ yeast spp. RX Diflucan  per protocol. Pt notified via MyChart.
